# Patient Record
Sex: Female | Born: 1990 | Race: White | Hispanic: No | Marital: Single | Smoking: Former smoker
Health system: Southern US, Community
[De-identification: ages and names within clinical notes are randomized; demographics above are authoritative.]

## PROBLEM LIST (undated history)

## (undated) ENCOUNTER — Inpatient Hospital Stay (HOSPITAL_COMMUNITY): Payer: Self-pay

## (undated) DIAGNOSIS — Z87898 Personal history of other specified conditions: Secondary | ICD-10-CM

## (undated) DIAGNOSIS — G894 Chronic pain syndrome: Secondary | ICD-10-CM

## (undated) DIAGNOSIS — J45909 Unspecified asthma, uncomplicated: Secondary | ICD-10-CM

## (undated) DIAGNOSIS — F4310 Post-traumatic stress disorder, unspecified: Secondary | ICD-10-CM

## (undated) DIAGNOSIS — F1191 Opioid use, unspecified, in remission: Secondary | ICD-10-CM

## (undated) HISTORY — DX: Unspecified asthma, uncomplicated: J45.909

## (undated) HISTORY — PX: CARPAL TUNNEL RELEASE: SHX101

## (undated) HISTORY — DX: Personal history of other specified conditions: Z87.898

## (undated) HISTORY — DX: Opioid use, unspecified, in remission: F11.91

## (undated) HISTORY — PX: WISDOM TOOTH EXTRACTION: SHX21

---
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
## 2009-01-10 ENCOUNTER — Encounter: Payer: Self-pay | Admitting: Orthopedic Surgery

## 2009-01-25 ENCOUNTER — Encounter: Payer: Self-pay | Admitting: Orthopedic Surgery

## 2009-02-01 ENCOUNTER — Ambulatory Visit: Payer: Self-pay | Admitting: Orthopedic Surgery

## 2009-02-01 DIAGNOSIS — G5600 Carpal tunnel syndrome, unspecified upper limb: Secondary | ICD-10-CM | POA: Insufficient documentation

## 2009-03-30 ENCOUNTER — Ambulatory Visit (HOSPITAL_COMMUNITY): Admission: RE | Admit: 2009-03-30 | Discharge: 2009-03-30 | Payer: Self-pay | Admitting: Orthopaedic Surgery

## 2009-06-06 ENCOUNTER — Ambulatory Visit (HOSPITAL_COMMUNITY): Admission: RE | Admit: 2009-06-06 | Discharge: 2009-06-06 | Payer: Self-pay | Admitting: Orthopaedic Surgery

## 2009-11-27 ENCOUNTER — Emergency Department (HOSPITAL_COMMUNITY): Admission: EM | Admit: 2009-11-27 | Discharge: 2009-11-27 | Payer: Self-pay | Admitting: Emergency Medicine

## 2010-10-30 ENCOUNTER — Emergency Department (HOSPITAL_COMMUNITY)
Admission: EM | Admit: 2010-10-30 | Discharge: 2010-10-30 | Payer: Self-pay | Source: Home / Self Care | Admitting: Emergency Medicine

## 2011-02-20 ENCOUNTER — Emergency Department (HOSPITAL_COMMUNITY)
Admission: EM | Admit: 2011-02-20 | Discharge: 2011-02-20 | Disposition: A | Discharge status: Home / Self Care | Payer: Self-pay | Attending: Emergency Medicine | Admitting: Emergency Medicine

## 2011-02-20 ENCOUNTER — Emergency Department (HOSPITAL_COMMUNITY): Payer: Self-pay

## 2011-02-20 DIAGNOSIS — R071 Chest pain on breathing: Secondary | ICD-10-CM | POA: Insufficient documentation

## 2011-03-03 LAB — CBC
HCT: 39.2 % (ref 36.0–46.0)
MCV: 88 fL (ref 78.0–100.0)
RBC: 4.46 MIL/uL (ref 3.87–5.11)
WBC: 13 10*3/uL — ABNORMAL HIGH (ref 4.0–10.5)

## 2011-03-03 LAB — DIFFERENTIAL
Basophils Absolute: 0 10*3/uL (ref 0.0–0.1)
Eosinophils Relative: 1 % (ref 0–5)
Lymphocytes Relative: 28 % (ref 12–46)
Neutro Abs: 8.6 10*3/uL — ABNORMAL HIGH (ref 1.7–7.7)

## 2011-03-03 LAB — URINALYSIS, ROUTINE W REFLEX MICROSCOPIC
Bilirubin Urine: NEGATIVE
Nitrite: NEGATIVE
Specific Gravity, Urine: 1.01 (ref 1.005–1.030)
Urobilinogen, UA: 0.2 mg/dL (ref 0.0–1.0)

## 2011-03-03 LAB — COMPREHENSIVE METABOLIC PANEL
BUN: 6 mg/dL (ref 6–23)
CO2: 26 mEq/L (ref 19–32)
Chloride: 106 mEq/L (ref 96–112)
Creatinine, Ser: 0.58 mg/dL (ref 0.4–1.2)
GFR calc non Af Amer: >60 mL/min (ref >60)
Glucose, Bld: 99 mg/dL (ref 70–99)
Total Bilirubin: 0.2 mg/dL — ABNORMAL LOW (ref 0.3–1.2)

## 2011-03-03 LAB — HCG, QUANTITATIVE, PREGNANCY: hCG, Beta Chain, Quant, S: <2 m[IU]/mL (ref <5)

## 2011-03-05 LAB — COMPREHENSIVE METABOLIC PANEL
ALT: 13 U/L (ref 0–35)
AST: 16 U/L (ref 0–37)
Albumin: 3.7 g/dL (ref 3.5–5.2)
CO2: 23 mEq/L (ref 19–32)
Calcium: 9 mg/dL (ref 8.4–10.5)
GFR calc Af Amer: >60 mL/min (ref >60)
GFR calc non Af Amer: >60 mL/min (ref >60)
Sodium: 138 mEq/L (ref 135–145)
Total Protein: 6.4 g/dL (ref 6.0–8.3)

## 2011-03-05 LAB — DIFFERENTIAL
Eosinophils Absolute: 0.1 10*3/uL (ref 0.0–0.7)
Eosinophils Relative: 1 % (ref 0–5)
Lymphs Abs: 3 10*3/uL (ref 0.7–4.0)
Monocytes Absolute: 0.6 10*3/uL (ref 0.1–1.0)
Monocytes Relative: 6 % (ref 3–12)

## 2011-03-05 LAB — URINALYSIS, ROUTINE W REFLEX MICROSCOPIC
Bilirubin Urine: NEGATIVE
Glucose, UA: NEGATIVE mg/dL
Ketones, ur: NEGATIVE mg/dL
Specific Gravity, Urine: 1.02 (ref 1.005–1.030)
pH: 6.5 (ref 5.0–8.0)

## 2011-03-05 LAB — URINE MICROSCOPIC-ADD ON

## 2011-03-05 LAB — CBC
MCHC: 34.7 g/dL (ref 30.0–36.0)
Platelets: 195 10*3/uL (ref 150–400)
RBC: 4.48 MIL/uL (ref 3.87–5.11)

## 2011-04-09 NOTE — Op Note (Signed)
NAME:  Jade Taylor, CARRIER           ACCOUNT NO.:  [id number]   MEDICAL RECORD NO.:  [id number]          PATIENT TYPE:  AMB   LOCATION:  DAY                           FACILITY:  [facility]   PHYSICIAN:  J. Darreld Mclean, M.D. DATE OF BIRTH:  1991/10/01   DATE OF PROCEDURE:  DATE OF DISCHARGE:                               OPERATIVE REPORT   PREOPERATIVE DIAGNOSIS:  Carpal tunnel syndrome, right.   POSTOPERATIVE DIAGNOSIS:  Carpal tunnel syndrome, right.   PROCEDURES:  Open release of right carpal tunnel with saline neurolysis,  aponeurotomy of the right median nerve.   ANESTHESIA:  Bier block.   SURGEON:  J. Darreld Mclean, MD   TOURNIQUET TIME:  Please refer anesthesia record.   DRAINS:  None.   SPLINT:  None.   The patient is an 20 year old female with positive EMGs for carpal  tunnel syndrome, severe on the right moderate on the left.  She has not  improved the conservative treatment.  It has gotten progressively worse  where she is dropping things.  She has definite findings of decreased  sensation in the median nerve, has not improved.  Surgery is  recommended.  Otherwise, she is young, but she has not improved with any  several treatments to date and she is getting worse.   Risk and imponderables of the procedure were discussed preoperatively  and she understood, she asked appropriate questions.   DESCRIPTION OF PROCEDURE:  The patient was seen in the holding area.  The right wrist was identified as a correct surgical site.  She placed a  marker, I placed a marker.  She had a tattoo at the most proximal  portion where the wound would be and I explained that the incision would  go through the tattoo and that it could deform the tattoo and she  understood this.  The patient was brought to the operating room, placed  supine on the operating room table and Bier block anesthesia was  obtained.  She was prepped and draped in the usual manner.   Generalized time-out, operating  room team identified the patient as Ms.  [name] for doing the right wrist.  The team members knew each other.  All instrumentation was deemed to be properly positioned and ready.  The  time out was therefore completed.   Outline for incision was made with careful dissection and the median  nerve was identified proximally.  The vessel loop placed around the  nerve.  A groove director was then placed in the carpal tunnel space.  The volar carpal ligament was incised.  The nerve was obviously  compressed.  Saline neurolysis and aponeurotomy carried out.  Retinaculum cut proximally under the skin.  There was inspected no  apparent injury, the specimen of volar carpal ligament sent to  Pathology.  The wound was then reapproximated using 3-0 nylon  interrupted vertical mattress manner.  Sterile dressing was applied.  Bulky dressing applied.  Sheet cotton applied.  Sheet cotton cut  dorsally.  An Ace bandage then applied loosely.  The patient tolerated  the procedure well.  Prescription given Vicodin ES for pain.  I will see  in the office in approximately 10 days 2 weeks.  If she has any  difficulty and she contact me through the office hospital beeper system,  numbers have been provided.           ______________________________  Shela Commons. Darreld Mclean, M.D.     JWK/MEDQ  D:  03/30/2009  T:  03/30/2009  Job:  [id number]

## 2011-04-09 NOTE — Op Note (Signed)
NAME:  Jade Taylor, Jade Taylor           ACCOUNT NO.:  [id number]   MEDICAL RECORD NO.:  [id number]          PATIENT TYPE:  AMB   LOCATION:  DAY                           FACILITY:  [facility]   PHYSICIAN:  J. Darreld Mclean, M.D. DATE OF BIRTH:  November 04, 1991   DATE OF PROCEDURE:  06/06/2009  DATE OF DISCHARGE:  06/06/2009                               OPERATIVE REPORT   PREOPERATIVE DIAGNOSIS:  Carpal tunnel syndrome, left.   POSTOPERATIVE DIAGNOSIS:  Carpal tunnel syndrome, left.   PROCEDURE:  Release of volar carpal ligament, saline neurolysis,  aponeurotomy, left median nerve.   ANESTHESIA:  Bier block.  Please refer anesthesia record for tourniquet  time.   SURGEON:  J. Darreld Mclean, MD   DRAINS:  No drains.  No splint.   INDICATIONS:  The patient is an 20 year old with positive nerve  conduction velocity studies and EMG showing carpal tunnel syndrome  bilaterally.  She has undergone right release and may has done well now  desires to have it done on the left side.  She understands the risks and  imponderables of the procedure.   DESCRIPTION OF PROCEDURE:  The patient was seen in the holding area, the  left wrist identified as correct surgical site.  She placed a mark, I  placed a mark.  She was brought to the operating room.  She placed  supine, given Bier block anesthesia.   A generalized time-out identified, [name] is the patient, left  wrist as correct surgical site.  All instrumentation was deemed to be  properly working.  The operating room team knew each other.   She has previously been prepped and draped.  Incision was made and the  median nerve identified proximally.  Vessel loop placed around the  nerve.  A groove director was used and the volar carpal ligament was  identified and then incised.  Specimen was sent to pathology.  The nerve  was obviously compressed.  Saline neurolysis aponeurotomy carried out.  Retinaculum cut proximally.  Wounds reapproximated using  3-0 nylon  interrupted vertical mattress manner.  Sterile dressing applied.  Bulky  dressing applied and sheet cotton applied.  Sheet cotton  cut dorsally.  ACE bandage applied loosely.  The patient tolerated the  procedure well.  Prescription of Vicodin ES given for pain.  She was  seen in the office in 1 week.  If any difficulties contact me through  the office hospital beeper system.           ______________________________  Shela Commons. Darreld Mclean, M.D.     JWK/MEDQ  D:  06/06/2009  T:  06/07/2009  Job:  [id number]

## 2011-04-09 NOTE — H&P (Signed)
NAME:  Jade Taylor, Jade Taylor           ACCOUNT NO.:  [id number]   MEDICAL RECORD NO.:  [id number]          PATIENT TYPE:  AMB   LOCATION:  DAY                           FACILITY:  [facility]   PHYSICIAN:  J. Darreld Mclean, M.D. DATE OF BIRTH:  [birth year]   DATE OF ADMISSION:  DATE OF DISCHARGE:  LH                              HISTORY & PHYSICAL   CHIEF COMPLAINT:  Carpal tunnel syndrome.   The patient is an 20 year old female with pain and tenderness in both  hands.  She had carpal tunnel syndrome release done on the right on Mar 30, 2009.  She had positive EMGs.  She is not responding to other means  of therapy or treatment.  She has had wrist splinting.  She had an EMG  by Dr. Gerilyn Pilgrim, showing the carpal tunnel syndrome.  She has done well  with the surgery on the right wrist and now presents for carpal tunnel  release on the left wrist.  Risks and imponderables have been explained  to the patient preoperatively, she appears to understand the procedure.  She just as stated went through it on the right side and understands  what is to be expected.   Past history is negative.   She has been followed by Dr. Gerilyn Pilgrim for nerve problems.   She is allergic to PENICILLIN and FLAGYL.   She does not smoke.  She does not use alcoholic beverages.   She takes Vicodin for pain and ibuprofen 100 mg t.i.d.   Other than the carpal tunnel surgery earlier this year, she had wisdom  teeth removed in January of this year.   [facility] Nobie Putnam [facility] is her family doctor.  She lives in  [place], is unmarried.   PHYSICAL EXAMINATION:  GENERAL:  She is alert, cooperative, and  oriented.  VITAL SIGNS:  Within normal limits.  HEENT:  Negative.  NECK:  Supple.  LUNGS:  Clear to P and A.  HEART:  Regular without murmur heard.  ABDOMEN:  Soft and nontender without masses.  EXTREMITIES:  Right hand has well-healed scar over the volar side with  good return of sensation.  Left hand has a  positive Phalen, positive  Tinel with decreased sensation in the median nerve distribution.  CNS:  Intact.  SKIN:  Intact.   IMPRESSION:  Carpal tunnel syndrome on the left, status post release  carpal tunnel on the right.   Her labs are pending.  This is an outpatient procedure.                                            ______________________________  J. Darreld Mclean, M.D.     JWK/MEDQ  D:  06/05/2009  T:  06/06/2009  Job:  [id number]

## 2011-04-09 NOTE — H&P (Signed)
NAME:  Jade Taylor, Jade Taylor           ACCOUNT NO.:  [id number]   MEDICAL RECORD NO.:  [id number]          PATIENT TYPE:  AMB   LOCATION:  DAY                           FACILITY:  [facility]   PHYSICIAN:  J. Darreld Mclean, M.D. DATE OF BIRTH:  Mar 28, 1991   DATE OF ADMISSION:  DATE OF DISCHARGE:  LH                              HISTORY & PHYSICAL   CHIEF COMPLAINT:  Carpal tunnel syndrome.   The patient is an 20 year old female with pain and tenderness in both  hands.  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  Gerilyn Pilgrim on January 10, 2009.  She has not responded to rest, to  splinting or other conservative means of treatment.  She has also been  seen by Dr. Romeo Apple.  Because she has not gotten better and because  pain continues, I have recommended carpal tunnel release even at this  young age.   PAST HISTORY:  Is negative.  She has been followed by Dr. Gerilyn Pilgrim for  the nerve problem.  She allergic to PENICILLIN and FLAGYL.  She does not  smoke.  She does not use alcoholic beverages.  She is taking Vicodin for  pain and ibuprofen 800 mg t.i.d.   She had wisdom teeth removed in January of this year.   Dr. Nobie Putnam and the health department are her family doctors.  She  lives in [place] and is unmarried.  She is alert, cooperative,  oriented.   PHYSICAL EXAMINATION:  VITAL SIGNS:  BP 140/80, pulse 80, respirations  16, afebrile, 5 feet 3.1, 222 pounds.  HEENT:  Negative.  NECK:  Supple.  LUNGS:  Clear to P and A.  HEART:  Regular with no murmur heard.  ABDOMEN:  Soft, nontender without masses.  EXTREMITIES:  She has got bilateral Phalen's, bilateral Tinel's - both  wrists, right greater than left.  Decreased sensation of the median  nerve right greater than left.  CENTRAL NERVOUS SYSTEM:  Intact except as noted.  SKIN:  Intact.   IMPRESSION:  Bilateral carpal tunnel syndrome, right greater  than left,  as demonstrated by EMGs.   PLAN:  Carpal tunnel release by open means on the right wrist.  I have  discussed with the patient planned procedure, risks and imponderables,  and she appears to understand.  She understands this is an elective  procedure.  Labs are pending.                                            ______________________________  J. Darreld Mclean, M.D.     JWK/MEDQ  D:  03/29/2009  T:  03/29/2009  Job:  [id number]

## 2011-04-12 NOTE — Discharge Summary (Signed)
Jade Taylor, Jade Taylor           ACCOUNT NO.:  [id number]   MEDICAL RECORD NO.:  [id number]          PATIENT TYPE:  INP   LOCATION:  6125                         FACILITY:  [facility]   PHYSICIAN:  ABELLI, M.D.           DATE OF BIRTH:  Jan 31, 1991   DATE OF ADMISSION:  06/09/2006  DATE OF DISCHARGE:  06/11/2006                                 DISCHARGE SUMMARY   REASON FOR ADMISSION:  Anaphylaxis.   SIGNIFICANT FINDINGS:  This is a 20 year old, recently treated with Flagyl  for a skin abscess, presenting with urticaria x1 day and shortness of breath  which developed the day of presentation.  No identifiable causes of this  elicited for the allergic reaction, as the antibiotic was discontinued on  the day prior to presentation.  The rash, edema, and shortness of breath  improved with Solu-Medrol, Benadryl, Pepcid, and epinephrine given in the  ER.  Given the severity of the rash and the shortness of breath, she was  admitted and given scheduled Solu-Medrol q.6 h. for observation of delayed  response.  She did have a delayed response the next evening with an  increased worsening of her rash over her thighs and stomach.  This resolved  with Benadryl, Loratadine, and Ranitidine and the IV steroids were still on  board.  The patient was improved and free of rash and shortness of breath at  discharge.   TREATMENT:  1.  IV corticosteroids, antihistamines and fluids.  2.  Incision and drainage of a back cyst/abscess on 06/10/2006 with 3 inch      Iodoform packing placed.  Bactrim DS was also given for the abscess.   OPERATIONS AND PROCEDURES:  None.   FINAL DIAGNOSES:  1.  Anaphylaxis, cause not identified.  2.  Skin abscess, Staph aureus.   DISCHARGE MEDICATIONS AND INSTRUCTIONS:  1.  Bactrim DS 1 tab p.o. b.i.d. x9 days.  2.  Epi-Pen 0.3 mg IM p.r.n. for shortness of breath and hives, dispense #3.   PENDING RESULTS AND ISSUES TO BE FOLLOWED:  1.  Back wound needs to be repacked  tomorrow at [facility]      and the patient is to follow up with peds surgery if the abscess does      not clear.  2.  Wound culture and sensitivities are still pending.  3.  Follow up at [facility], phone number [phone], fax      number [phone], at 10:15 a.m., tomorrow morning, Friday 06/12/2006.   DISCHARGE WEIGHT:  90 k.   DISCHARGE CONDITION:  Improved, stable.     ______________________________  Pediatrics Resident    ______________________________  Teodoro Spray, M.D.    PR/MEDQ  D:  06/11/2006  T:  06/11/2006  Job:  [id number]   cc:   Health Department [facility]  Fax: [phone]

## 2011-08-13 ENCOUNTER — Encounter: Payer: Self-pay | Admitting: *Deleted

## 2011-08-13 ENCOUNTER — Emergency Department (HOSPITAL_COMMUNITY): Payer: Self-pay

## 2011-08-13 ENCOUNTER — Emergency Department (HOSPITAL_COMMUNITY)
Admission: EM | Admit: 2011-08-13 | Discharge: 2011-08-13 | Disposition: A | Discharge status: Home / Self Care | Payer: Self-pay | Attending: Emergency Medicine | Admitting: Emergency Medicine

## 2011-08-13 DIAGNOSIS — R10819 Abdominal tenderness, unspecified site: Secondary | ICD-10-CM | POA: Insufficient documentation

## 2011-08-13 DIAGNOSIS — N898 Other specified noninflammatory disorders of vagina: Secondary | ICD-10-CM | POA: Insufficient documentation

## 2011-08-13 DIAGNOSIS — R630 Anorexia: Secondary | ICD-10-CM | POA: Insufficient documentation

## 2011-08-13 DIAGNOSIS — R109 Unspecified abdominal pain: Secondary | ICD-10-CM

## 2011-08-13 DIAGNOSIS — D72829 Elevated white blood cell count, unspecified: Secondary | ICD-10-CM

## 2011-08-13 HISTORY — DX: Chronic pain syndrome: G89.4

## 2011-08-13 LAB — DIFFERENTIAL
Basophils Absolute: 0 10*3/uL (ref 0.0–0.1)
Lymphocytes Relative: 20 % (ref 12–46)
Neutro Abs: 10.7 10*3/uL — ABNORMAL HIGH (ref 1.7–7.7)
Neutrophils Relative %: 73 % (ref 43–77)

## 2011-08-13 LAB — COMPREHENSIVE METABOLIC PANEL
ALT: 10 U/L (ref 0–35)
AST: 12 U/L (ref 0–37)
Albumin: 4 g/dL (ref 3.5–5.2)
CO2: 27 mEq/L (ref 19–32)
Chloride: 100 mEq/L (ref 96–112)
GFR calc non Af Amer: >60 mL/min (ref >60)
Sodium: 136 mEq/L (ref 135–145)
Total Bilirubin: 0.3 mg/dL (ref 0.3–1.2)

## 2011-08-13 LAB — URINALYSIS, ROUTINE W REFLEX MICROSCOPIC
Ketones, ur: NEGATIVE mg/dL
Leukocytes, UA: NEGATIVE
Nitrite: NEGATIVE
Specific Gravity, Urine: <1.005 — ABNORMAL LOW (ref 1.005–1.030)
Urobilinogen, UA: 0.2 mg/dL (ref 0.0–1.0)
pH: 6.5 (ref 5.0–8.0)

## 2011-08-13 LAB — CBC
MCH: 30.8 pg (ref 26.0–34.0)
MCV: 88.9 fL (ref 78.0–100.0)
Platelets: 226 10*3/uL (ref 150–400)
RBC: 4.77 MIL/uL (ref 3.87–5.11)
RDW: 13 % (ref 11.5–15.5)
WBC: 14.7 10*3/uL — ABNORMAL HIGH (ref 4.0–10.5)

## 2011-08-13 LAB — URINE MICROSCOPIC-ADD ON

## 2011-08-13 MED ORDER — ONDANSETRON HCL 4 MG/2ML IJ SOLN
4.0000 mg | Freq: Once | INTRAMUSCULAR | Status: AC
  Administered 2011-08-13: 4 mg via INTRAVENOUS
  Filled 2011-08-13: qty 2

## 2011-08-13 MED ORDER — MORPHINE SULFATE 4 MG/ML IJ SOLN
4.0000 mg | Freq: Once | INTRAMUSCULAR | Status: AC
  Administered 2011-08-13: 4 mg via INTRAVENOUS
  Filled 2011-08-13: qty 1

## 2011-08-13 MED ORDER — OXYCODONE-ACETAMINOPHEN 5-325 MG PO TABS: 2.0000 | ORAL_TABLET | ORAL | Status: AC | PRN

## 2011-08-13 MED ORDER — IOHEXOL 300 MG/ML  SOLN
100.0000 mL | Freq: Once | INTRAMUSCULAR | Status: AC | PRN
  Administered 2011-08-13: 100 mL via INTRAVENOUS

## 2011-08-13 MED ORDER — SODIUM CHLORIDE 0.9 % IV SOLN
INTRAVENOUS | Status: DC
  Administered 2011-08-13: 21:00:00 via INTRAVENOUS

## 2011-08-13 NOTE — ED Provider Notes (Addendum)
History     CSN: [id number] Arrival date & time: 08/13/2011  7:58 PM   Chief Complaint  Patient presents with  . Abdominal Pain     (Include location/radiation/quality/duration/timing/severity/associated sxs/prior treatment) Patient is a 20 y.o. female presenting with abdominal pain. The history is provided by the patient.  Abdominal Pain The primary symptoms of the illness include abdominal pain. The primary symptoms of the illness do not include fever, shortness of breath, nausea, vomiting, diarrhea, dysuria, vaginal discharge or vaginal bleeding.  Symptoms associated with the illness do not include chills, diaphoresis, hematuria, frequency or back pain.   she is a 20 year old female.  She presents to the emergency department with right lower abdominal pain for 2 days.  She denies nausea, vomiting, diarrhea, urinary tract symptoms.  She denies vaginal bleeding or discharge.  She has not eaten since this morning and she has no appetite now.  She says her last menstrual period was about a week ago and it was normal.  She denies a prior history of abdominal surgery.  She drinks alcohol only occasionally.   Past Medical History  Diagnosis Date  . Chronic pain syndrome      Past Surgical History  Procedure Date  . Carpal tunnel release   . Wisdom tooth extraction     History reviewed. No pertinent family history.  History  Substance Use Topics  . Smoking status: Current Some Day Smoker -- 0.5 packs/day for .5 years    Types: Cigarettes  . Smokeless tobacco: Not on file  . Alcohol Use: Yes     ocassilly    OB History    Grav Para Term Preterm Abortions TAB SAB Ect Mult Living                  Review of Systems  Constitutional: Negative for fever, chills and diaphoresis.  HENT: Negative for congestion and neck pain.   Eyes: Negative for redness.  Respiratory: Negative for cough, chest tightness and shortness of breath.   Gastrointestinal: Positive for abdominal pain.  Negative for nausea, vomiting and diarrhea.  Genitourinary: Negative for dysuria, frequency, hematuria, vaginal bleeding, vaginal discharge and pelvic pain.  Musculoskeletal: Negative for back pain.  Skin: Negative for rash.  Neurological: Negative for light-headedness, numbness and headaches.  Psychiatric/Behavioral: Negative for confusion.    Allergies  Penicillins and Metronidazole  Home Medications  No current outpatient prescriptions on file.  Physical Exam    BP 138/91  Pulse 110  Temp(Src) 98.3 F (36.8 C) (Oral)  Resp 20  Ht 5\' 2"  (1.575 m)  Wt 187 lb (84.823 kg)  BMI 34.20 kg/m2  SpO2 99%  LMP 08/03/2011  Physical Exam  Constitutional: She is oriented to person, place, and time. She appears well-developed and well-nourished.       She appears to be uncomfortable  HENT:  Head: Normocephalic and atraumatic.  Eyes: Pupils are equal, round, and reactive to light.  Neck: Normal range of motion.  Cardiovascular: Normal rate, regular rhythm and normal heart sounds.   No murmur heard. Pulmonary/Chest: Effort normal and breath sounds normal. No respiratory distress. She has no wheezes. She has no rales.  Abdominal: Soft. She exhibits no distension and no mass. There is tenderness. There is rebound. There is no guarding.       Positive Rovsing sign Positive psoas Negative obturator sign  Genitourinary: Vaginal discharge found.       She has a small amount of thin, white vaginal discharge.  She states she  had intercourse today.  She did not have cervical motion tenderness.  There are no lesions.  No adnexal masses  The pelvic examination was performed with the female chaperone  Musculoskeletal: Normal range of motion. She exhibits no edema and no tenderness.  Neurological: She is alert and oriented to person, place, and time. No cranial nerve deficit.  Skin: Skin is warm and dry. No rash noted. No erythema.  Psychiatric: She has a normal mood and affect. Her behavior is  normal.    ED Course  Procedures  20 year old female with right lower bowel pain for 2 days, along with anorexia.  No urinary tract symptoms, or gynecological symptoms.  She has abdominal wall tenderness, rebound, Rovsing's and psoas signs.  Therefore, I suspect that she is probably not an appendicitis rather than a gynecological source of her symptoms.  We will perform laboratory testing and a CAT scan of her abdomen.  I will provide her with IV analgesics while we are awaiting for the test results.  9:57 PM Pain persists, so I will give her more morphine IV.  I explained to her the results of her tests including a negative CT, so we will perform  pelvic examination.  10:10 PM Pain control No results found.   No diagnosis found.   MDM Abdominal pain  for 2 days, with no nausea, vomiting, diarrhea or urinary tract symptoms.  She denies vaginal discharge, and she has not had a fever, or leukocytosis.  May be due  To demarginization.  She does not have an appendicitis.   She does not have cervical motion tenderness or adnexal masses.  There is no acute abdomen.  CT also states says that the uterus and adnexa are within normal limits.        Nicholes Stairs, MD 08/13/11 2210  Nicholes Stairs, MD 08/13/11 0454  Nicholes Stairs, MD 08/13/11 2212

## 2011-08-13 NOTE — ED Notes (Signed)
Pt states pain started Sunday but improved became worse today. Denies any vomiting or diarrhea. Last bowel movement this morning

## 2011-09-05 LAB — CBC
HCT: 40.6
Hemoglobin: 13.7
MCHC: 33.7
MCV: 85.6
Platelets: 217
RDW: 13.5

## 2011-09-05 LAB — DIFFERENTIAL
Basophils Absolute: 0.1
Basophils Relative: 1
Eosinophils Absolute: 0.1
Eosinophils Relative: 0
Lymphocytes Relative: 26
Monocytes Absolute: 0.9

## 2011-09-05 LAB — URINALYSIS, ROUTINE W REFLEX MICROSCOPIC
Glucose, UA: NEGATIVE
Leukocytes, UA: NEGATIVE
Nitrite: NEGATIVE
Protein, ur: NEGATIVE

## 2011-09-05 LAB — WET PREP, GENITAL: Yeast Wet Prep HPF POC: NONE SEEN

## 2011-09-05 LAB — URINE MICROSCOPIC-ADD ON

## 2011-09-05 LAB — PREGNANCY, URINE: Preg Test, Ur: NEGATIVE

## 2011-09-06 LAB — BASIC METABOLIC PANEL
CO2: 23
Chloride: 107
Glucose, Bld: 113 — ABNORMAL HIGH
Potassium: 3.5
Sodium: 137

## 2011-09-06 LAB — DIFFERENTIAL
Basophils Absolute: 0
Basophils Relative: 0
Eosinophils Relative: 2
Monocytes Absolute: 0.9
Monocytes Relative: 7
Neutro Abs: 9.9 — ABNORMAL HIGH

## 2011-09-06 LAB — CBC
HCT: 39.7
Hemoglobin: 13.7
MCHC: 34.4
RBC: 4.7
RDW: 13.4

## 2012-09-21 ENCOUNTER — Encounter (HOSPITAL_COMMUNITY): Payer: Self-pay | Admitting: *Deleted

## 2012-09-21 ENCOUNTER — Emergency Department (HOSPITAL_COMMUNITY): Payer: Self-pay

## 2012-09-21 ENCOUNTER — Emergency Department (HOSPITAL_COMMUNITY)
Admission: EM | Admit: 2012-09-21 | Discharge: 2012-09-21 | Disposition: A | Discharge status: Home / Self Care | Payer: Self-pay | Attending: Emergency Medicine | Admitting: Emergency Medicine

## 2012-09-21 DIAGNOSIS — S93409A Sprain of unspecified ligament of unspecified ankle, initial encounter: Secondary | ICD-10-CM | POA: Insufficient documentation

## 2012-09-21 DIAGNOSIS — Z8669 Personal history of other diseases of the nervous system and sense organs: Secondary | ICD-10-CM | POA: Insufficient documentation

## 2012-09-21 DIAGNOSIS — Y92009 Unspecified place in unspecified non-institutional (private) residence as the place of occurrence of the external cause: Secondary | ICD-10-CM | POA: Insufficient documentation

## 2012-09-21 DIAGNOSIS — W19XXXA Unspecified fall, initial encounter: Secondary | ICD-10-CM | POA: Insufficient documentation

## 2012-09-21 DIAGNOSIS — F17200 Nicotine dependence, unspecified, uncomplicated: Secondary | ICD-10-CM | POA: Insufficient documentation

## 2012-09-21 DIAGNOSIS — Z8659 Personal history of other mental and behavioral disorders: Secondary | ICD-10-CM | POA: Insufficient documentation

## 2012-09-21 DIAGNOSIS — Y939 Activity, unspecified: Secondary | ICD-10-CM | POA: Insufficient documentation

## 2012-09-21 DIAGNOSIS — S93402A Sprain of unspecified ligament of left ankle, initial encounter: Secondary | ICD-10-CM

## 2012-09-21 HISTORY — DX: Post-traumatic stress disorder, unspecified: F43.10

## 2012-09-21 MED ORDER — HYDROCODONE-ACETAMINOPHEN 5-325 MG PO TABS
2.0000 | ORAL_TABLET | Freq: Once | ORAL | Status: AC
  Administered 2012-09-21: 2 via ORAL
  Filled 2012-09-21: qty 2

## 2012-09-21 MED ORDER — HYDROCODONE-ACETAMINOPHEN 7.5-325 MG PO TABS: 1.0000 | ORAL_TABLET | ORAL | Status: DC | PRN

## 2012-09-21 MED ORDER — DICLOFENAC SODIUM 75 MG PO TBEC: 75.0000 mg | DELAYED_RELEASE_TABLET | Freq: Two times a day (BID) | ORAL | Status: AC

## 2012-09-21 MED ORDER — KETOROLAC TROMETHAMINE 10 MG PO TABS
10.0000 mg | ORAL_TABLET | Freq: Once | ORAL | Status: AC
Start: 2012-09-21 — End: 2012-09-21
  Administered 2012-09-21: 10 mg via ORAL
  Filled 2012-09-21: qty 1

## 2012-09-21 MED ORDER — ONDANSETRON HCL 4 MG PO TABS
4.0000 mg | ORAL_TABLET | Freq: Once | ORAL | Status: AC
  Administered 2012-09-21: 4 mg via ORAL
  Filled 2012-09-21: qty 1

## 2012-09-21 NOTE — ED Notes (Signed)
Jade Taylor today and injured lt ankle.

## 2012-09-21 NOTE — ED Provider Notes (Signed)
History     CSN: [id number]  Arrival date & time 09/21/12  1815   None     Chief Complaint  Patient presents with  . Ankle Pain    (Consider location/radiation/quality/duration/timing/severity/associated sxs/prior treatment) Patient is a 21 y.o. female presenting with ankle pain. The history is provided by the patient.  Ankle Pain  The incident occurred 3 to 5 hours ago. The incident occurred at home. The injury mechanism was a fall. The pain is present in the left ankle. The quality of the pain is described as throbbing. The pain is severe. The pain has been constant since onset. Associated symptoms include inability to bear weight. Pertinent negatives include no loss of sensation. She reports no foreign bodies present. The symptoms are aggravated by activity and palpation. She has tried nothing for the symptoms.    Past Medical History  Diagnosis Date  . Chronic pain syndrome   . PTSD (post-traumatic stress disorder)     Past Surgical History  Procedure Date  . Carpal tunnel release   . Wisdom tooth extraction     History reviewed. No pertinent family history.  History  Substance Use Topics  . Smoking status: Current Some Day Smoker -- 0.5 packs/day for .5 years    Types: Cigarettes  . Smokeless tobacco: Not on file  . Alcohol Use: Yes     ocassilly    OB History    Grav Para Term Preterm Abortions TAB SAB Ect Mult Living                  Review of Systems  Constitutional: Negative for activity change.       All ROS Neg except as noted in HPI  HENT: Negative for nosebleeds and neck pain.   Eyes: Negative for photophobia and discharge.  Respiratory: Negative for cough, shortness of breath and wheezing.   Cardiovascular: Negative for chest pain and palpitations.  Gastrointestinal: Negative for abdominal pain and blood in stool.  Genitourinary: Negative for dysuria, frequency and hematuria.  Musculoskeletal: Positive for arthralgias. Negative for back pain.    Skin: Negative.   Neurological: Negative for dizziness, seizures and speech difficulty.  Psychiatric/Behavioral: Negative for hallucinations and confusion.    Allergies  Metronidazole; Penicillins; and Amoxicillin  Home Medications  No current outpatient prescriptions on file.  BP 116/67  Pulse 109  Temp 98.4 F (36.9 C) (Oral)  Resp 20  Ht 5\' 2"  (1.575 m)  Wt 198 lb 3 oz (89.897 kg)  BMI 36.25 kg/m2  SpO2 100%  LMP 09/21/2012  Physical Exam  Nursing note and vitals reviewed. Constitutional: She is oriented to person, place, and time. She appears well-developed and well-nourished.  Non-toxic appearance.  HENT:  Head: Normocephalic.  Right Ear: Tympanic membrane and external ear normal.  Left Ear: Tympanic membrane and external ear normal.  Eyes: EOM and lids are normal. Pupils are equal, round, and reactive to light.  Neck: Normal range of motion. Neck supple. Carotid bruit is not present.  Cardiovascular: Normal rate, regular rhythm, normal heart sounds, intact distal pulses and normal pulses.   Pulmonary/Chest: Breath sounds normal. No respiratory distress.  Abdominal: Soft. Bowel sounds are normal. There is no tenderness. There is no guarding.  Musculoskeletal: Normal range of motion.       There is full range of motion of the left hip and knee. There is no deformity of the left tibia or fibula area. There is lateral malleolus pain to palpation or manipulation. There is mild-to-moderate  swelling in this area. The dorsalis pedis and posterior tibial pulses are symmetrical. The capillary refill is less than 3 seconds, and the sensory is intact.  Lymphadenopathy:       Head (right side): No submandibular adenopathy present.       Head (left side): No submandibular adenopathy present.    She has no cervical adenopathy.  Neurological: She is alert and oriented to person, place, and time. She has normal strength. No cranial nerve deficit or sensory deficit.  Skin: Skin is warm  and dry.  Psychiatric: She has a normal mood and affect. Her speech is normal.    ED Course  Procedures (including critical care time)  Labs Reviewed - No data to display Dg Ankle Complete Left  09/21/2012  *RADIOLOGY REPORT*  Clinical Data: Fall.  Ankle pain  LEFT ANKLE COMPLETE - 3+ VIEW  Comparison: None.  Findings: Negative for fracture.  Normal alignment is normal joint space.  Negative for joint effusion.  IMPRESSION: Negative for fracture.   Original Report Authenticated By: Camelia Phenes, M.D.      No diagnosis found.    MDM  I have reviewed nursing notes, vital signs, and all appropriate lab and imaging results for this patient. X-ray of the left ankle is negative for fracture or dislocation. Patient states she is having severe pain of the lateral ankle area. There is mild swelling present. The patient will be treated with an ankle stirrup splint, crutches, ice pack, and prescription for Norco #20, and diclofenac. Patient has seen Dr. Hilda Lias in the past because of a fracture of the left ankle and we'll see him for followup evaluation.       Kathie Dike, [place] 09/21/12 2113

## 2012-09-24 NOTE — ED Provider Notes (Signed)
Medical screening examination/treatment/procedure(s) were performed by non-physician practitioner and as supervising physician I was immediately available for consultation/collaboration.   Laray Anger, DO 09/24/12 1237

## 2014-01-26 DIAGNOSIS — IMO0002 Reserved for concepts with insufficient information to code with codable children: Secondary | ICD-10-CM

## 2014-07-10 ENCOUNTER — Encounter (HOSPITAL_COMMUNITY): Payer: Self-pay | Admitting: Emergency Medicine

## 2014-07-10 ENCOUNTER — Emergency Department (HOSPITAL_COMMUNITY): Payer: Medicaid Other

## 2014-07-10 ENCOUNTER — Emergency Department (HOSPITAL_COMMUNITY)
Admission: EM | Admit: 2014-07-10 | Discharge: 2014-07-10 | Disposition: A | Discharge status: Home / Self Care | Payer: Medicaid Other | Attending: Emergency Medicine | Admitting: Emergency Medicine

## 2014-07-10 DIAGNOSIS — F17200 Nicotine dependence, unspecified, uncomplicated: Secondary | ICD-10-CM | POA: Insufficient documentation

## 2014-07-10 DIAGNOSIS — R079 Chest pain, unspecified: Secondary | ICD-10-CM | POA: Diagnosis present

## 2014-07-10 DIAGNOSIS — Z791 Long term (current) use of non-steroidal anti-inflammatories (NSAID): Secondary | ICD-10-CM | POA: Diagnosis not present

## 2014-07-10 DIAGNOSIS — Z8659 Personal history of other mental and behavioral disorders: Secondary | ICD-10-CM | POA: Insufficient documentation

## 2014-07-10 DIAGNOSIS — Z880 Allergy status to penicillin: Secondary | ICD-10-CM | POA: Insufficient documentation

## 2014-07-10 DIAGNOSIS — G894 Chronic pain syndrome: Secondary | ICD-10-CM | POA: Diagnosis not present

## 2014-07-10 DIAGNOSIS — I319 Disease of pericardium, unspecified: Secondary | ICD-10-CM | POA: Diagnosis not present

## 2014-07-10 LAB — BASIC METABOLIC PANEL
Anion gap: 11 (ref 5–15)
BUN: 5 mg/dL — ABNORMAL LOW (ref 6–23)
CO2: 26 mEq/L (ref 19–32)
Calcium: 9.4 mg/dL (ref 8.4–10.5)
Chloride: 102 mEq/L (ref 96–112)
Creatinine, Ser: 0.7 mg/dL (ref 0.50–1.10)
GFR calc Af Amer: >90 mL/min (ref >90)
GFR calc non Af Amer: >90 mL/min (ref >90)
Glucose, Bld: 100 mg/dL — ABNORMAL HIGH (ref 70–99)
Potassium: 3.4 mEq/L — ABNORMAL LOW (ref 3.7–5.3)
Sodium: 139 mEq/L (ref 137–147)

## 2014-07-10 LAB — CBC WITH DIFFERENTIAL/PLATELET
Basophils Absolute: 0 10*3/uL (ref 0.0–0.1)
Basophils Relative: 0 % (ref 0–1)
Eosinophils Absolute: 0.1 10*3/uL (ref 0.0–0.7)
Eosinophils Relative: 0 % (ref 0–5)
HCT: 42.1 % (ref 36.0–46.0)
Hemoglobin: 14.7 g/dL (ref 12.0–15.0)
Lymphocytes Relative: 20 % (ref 12–46)
Lymphs Abs: 3.1 10*3/uL (ref 0.7–4.0)
MCH: 30.7 pg (ref 26.0–34.0)
MCHC: 34.9 g/dL (ref 30.0–36.0)
MCV: 87.9 fL (ref 78.0–100.0)
Monocytes Absolute: 0.6 10*3/uL (ref 0.1–1.0)
Monocytes Relative: 4 % (ref 3–12)
Neutro Abs: 12 10*3/uL — ABNORMAL HIGH (ref 1.7–7.7)
Neutrophils Relative %: 76 % (ref 43–77)
Platelets: 199 10*3/uL (ref 150–400)
RBC: 4.79 MIL/uL (ref 3.87–5.11)
RDW: 14.3 % (ref 11.5–15.5)
WBC: 15.7 10*3/uL — ABNORMAL HIGH (ref 4.0–10.5)

## 2014-07-10 LAB — D-DIMER, QUANTITATIVE: D-Dimer, Quant: 0.48 ug/mL-FEU (ref 0.00–0.48)

## 2014-07-10 LAB — TROPONIN I: Troponin I: <0.3 ng/mL (ref <0.30)

## 2014-07-10 LAB — PREGNANCY, URINE: Preg Test, Ur: NEGATIVE

## 2014-07-10 MED ORDER — ONDANSETRON HCL 4 MG/2ML IJ SOLN
4.0000 mg | Freq: Once | INTRAMUSCULAR | Status: AC
  Administered 2014-07-10: 4 mg via INTRAMUSCULAR
  Filled 2014-07-10: qty 2

## 2014-07-10 MED ORDER — KETOROLAC TROMETHAMINE 30 MG/ML IJ SOLN
15.0000 mg | Freq: Once | INTRAMUSCULAR | Status: AC
  Administered 2014-07-10: 15 mg via INTRAVENOUS
  Filled 2014-07-10: qty 1

## 2014-07-10 MED ORDER — LORAZEPAM 2 MG/ML IJ SOLN
0.5000 mg | Freq: Once | INTRAMUSCULAR | Status: AC
  Administered 2014-07-10: 0.5 mg via INTRAVENOUS
  Filled 2014-07-10: qty 1

## 2014-07-10 MED ORDER — SODIUM CHLORIDE 0.9 % IV BOLUS (SEPSIS)
1000.0000 mL | Freq: Once | INTRAVENOUS | Status: AC
  Administered 2014-07-10: 1000 mL via INTRAVENOUS

## 2014-07-10 MED ORDER — MORPHINE SULFATE 4 MG/ML IJ SOLN
4.0000 mg | Freq: Once | INTRAMUSCULAR | Status: AC
  Administered 2014-07-10: 4 mg via INTRAVENOUS
  Filled 2014-07-10: qty 1

## 2014-07-10 MED ORDER — IBUPROFEN 400 MG PO TABS
400.0000 mg | ORAL_TABLET | Freq: Once | ORAL | Status: AC
  Administered 2014-07-10: 400 mg via ORAL
  Filled 2014-07-10: qty 1

## 2014-07-10 NOTE — ED Notes (Signed)
Pt presents to ED c/o chest pain x 3 days. Started intermittently but became constant yesterday. States that pain is exacerbated by exertion but that it also hurts when she is lying down. Pain is less severe when she is in sitting position. States she has taken acetaminophen for the pain. Pt states she does smoke marijuana sometimes but denies amphetamine use. Pt rates pain 7/10 but states that sometimes it is worse. Denies nausea/vomiting; has had dizziness/lightheaded episodes as well as "hot flashes." NAD.

## 2014-07-10 NOTE — ED Notes (Signed)
C/o anxiety, tearful in triage room

## 2014-07-10 NOTE — ED Notes (Signed)
Pt with mid CP since Thursday that radiates up to closer to throat

## 2014-07-10 NOTE — Discharge Instructions (Signed)
Pericarditis °Pericarditis is swelling (inflammation) of the pericardium. The pericardium is a thin, double-layered, fluid-filled tissue sac that surrounds the heart. The purpose of the pericardium is to contain the heart in the chest cavity and keep the heart from overexpanding. Different types of pericarditis can occur, such as: °· Acute pericarditis. Inflammation can develop suddenly in acute pericarditis. °· Chronic pericarditis. Inflammation develops gradually and is long-lasting in chronic pericarditis. °· Constrictive pericarditis. In this type of pericarditis, the layers of the pericardium stiffen and develop scar tissue. The scar tissue thickens and sticks together. This makes it difficult for the heart to pump and work as it normally does. °CAUSES  °Pericarditis can be caused from different conditions, such as: °· A bacterial, fungal or viral infection. °· After a heart attack (myocardial infarction). °· After open-heart surgery (coronary bypass graft surgery). °· Auto-immune conditions such as lupus, rheumatoid arthritis or scleroderma. °· Kidney failure. °· Low thyroid condition (hypothyroidism). °· Cancer from another part of the body that has spread (metastasized) to the pericardium. °· Chest injury or trauma. °· After radiation treatment. °· Certain medicines. °SYMPTOMS  °Symptoms of pericarditis can include: °· Chest pain. Chest pain symptoms may increase when laying down and may be relieved when sitting up and leaning forward. °· A chronic, dry cough. °· Heart palpitations. These may feel like rapid, fluttering or pounding heart beats. °· Chest pain may be worse when swallowing. °· Dizziness or fainting. °· Tiredness, fatigue or lethargy. °· Fever. °DIAGNOSIS  °Pericarditis is diagnosed by the following: °· A physical exam. A heart sound called a pericardial friction rub may be heard when your caregiver listens to your heart. °· Blood work. Blood may be drawn to check for an infection and to look at  your blood chemistry. °· Electrocardiography. During electrocardiography your heart's electrical activity is monitored and recorded with a tracing on paper (electrocardiogram [ECG]). °· Echocardiography. °· Computed tomography (CT). °· Magnetic resonance image (MRI). °TREATMENT  °To treat pericarditis, it is important to know the cause of it. The cause of pericarditis determines the treatment.  °· If the cause of pericarditis is due to an infection, treatment is based on the type of infection. If an infection is suspected in the pericardial fluid, a procedure called a pericardial fluid culture and biopsy may be done. This takes a sample of the pericardial fluid. The sample is sent to a lab which runs tests on the pericardial fluid to check for an infection. °· If the autoimmune disease is the cause, treatment of the autoimmune condition will help improve the pericarditis. °· If the cause of pericarditis is not known, anti-inflammatory medicines may be used to help decrease the inflammation. °· Surgery may be needed. The following are types of surgeries or procedures that may be done to treat pericarditis: °¨ Pericardial window. A pericardial window makes a cut (incision) into the pericardial sac. This allows excess fluid in the pericardium to drain. °¨ Pericardiocentesis. A pericardiocentesis is also known as a pericardial tap. This procedure uses a needle that is guided by X-ray to drain (aspirate) excess fluid from the pericardium. °¨ Pericardiectomy. A pericardiectomy removes part or all of the pericardium. °HOME CARE INSTRUCTIONS  °· Do not smoke. If you smoke, quit. Your caregiver can help you quit smoking. °· Maintain a healthy weight. °· Follow an exercise program as told by your caregiver. °· If you drink alcohol, do so in moderation. °· Eat a heart healthy diet. A registered dietician can help you learn about   healthy food choices. °· Keep a list of all your medicines with you at all times. Include the name,  dose, how often it is taken and how it is taken. °SEEK IMMEDIATE MEDICAL CARE IF:  °· You have chest pain or feelings of chest pressure. °· You have sweating (diaphoresis) when at rest. °· You have irregular heartbeats (palpitations). °· You have rapid, racing heart beats. °· You have unexplained fainting episodes. °· You feel sick to your stomach (nausea) or vomiting without cause. °· You have unexplained weakness. °If you develop any of the symptoms which originally made you seek care, call for local emergency medical help. Do not drive yourself to the hospital. °Document Released: 05/07/2001 Document Revised: 02/03/2012 Document Reviewed: 11/13/2011 °ExitCare® Patient Information ©2015 [employer]. This information is not intended to replace advice given to you by your health care provider. Make sure you discuss any questions you have with your health care provider. ° °

## 2014-07-10 NOTE — ED Provider Notes (Signed)
CSN: [id number]     Arrival date & time 07/10/14  1621 History   First MD Initiated Contact with Patient 07/10/14 1658     Chief Complaint  Patient presents with  . Chest Pain     (Consider location/radiation/quality/duration/timing/severity/associated sxs/prior Treatment) HPI  23 year old female with chest pain. Gradual onset about 3 days ago. Pain as sharp. Worsen with deep inspiration and when laying supine. No fevers or chills. Occasional nonproductive cough. No shortness of breath. Some mild lightheadedness. No syncope. Occasionally smokes marijuana. Denies any other drug use. No unusual leg pain or swelling. No history similar symptoms. She states that she does feel somewhat anxious.  Past Medical History  Diagnosis Date  . Chronic pain syndrome   . PTSD (post-traumatic stress disorder)    Past Surgical History  Procedure Laterality Date  . Carpal tunnel release    . Wisdom tooth extraction     History reviewed. No pertinent family history. History  Substance Use Topics  . Smoking status: Current Some Day Smoker -- 0.50 packs/day for .5 years    Types: Cigarettes  . Smokeless tobacco: Not on file  . Alcohol Use: Yes     Comment: ocassilly   OB History   Grav Para Term Preterm Abortions TAB SAB Ect Mult Living                 Review of Systems  All systems reviewed and negative, other than as noted in HPI.   Allergies  Metronidazole; Penicillins; and Amoxicillin  Home Medications   Prior to Admission medications   Medication Sig Start Date End Date Taking? Authorizing Provider  diclofenac (VOLTAREN) 75 MG EC tablet Take 75 mg by mouth 2 (two) times daily.   Yes Historical Provider, MD  HYDROcodone-acetaminophen (NORCO) 7.5-325 MG per tablet Take 1 tablet by mouth every 4 (four) hours as needed. pain   Yes Historical Provider, MD   BP 128/103  Pulse 128  Temp(Src) 98.3 F (36.8 C) (Oral)  Resp 22  Ht 5\' 2"  (1.575 m)  Wt 190 lb (86.183 kg)  BMI 34.74  kg/m2  SpO2 99%  LMP 07/08/2014 Physical Exam  Nursing note and vitals reviewed. Constitutional: She appears well-developed and well-nourished. No distress.  HENT:  Head: Normocephalic and atraumatic.  Eyes: Conjunctivae are normal. Right eye exhibits no discharge. Left eye exhibits no discharge.  Neck: Neck supple.  Cardiovascular: Normal rate, regular rhythm and normal heart sounds.  Exam reveals no gallop and no friction rub.   No murmur heard. Pulmonary/Chest: Effort normal and breath sounds normal. No respiratory distress.  Abdominal: Soft. She exhibits no distension. There is no tenderness.  Musculoskeletal: She exhibits no edema and no tenderness.  Lower extremities symmetric as compared to each other. No calf tenderness. Negative Homan's. No palpable cords.   Neurological: She is alert.  Skin: Skin is warm and dry.  Psychiatric: She has a normal mood and affect. Her behavior is normal. Thought content normal.    ED Course  Procedures (including critical care time) Labs Review Labs Reviewed  BASIC METABOLIC PANEL - Abnormal; Notable for the following:    Potassium 3.4 (*)    Glucose, Bld 100 (*)    BUN 5 (*)    All other components within normal limits  CBC WITH DIFFERENTIAL - Abnormal; Notable for the following:    WBC 15.7 (*)    Neutro Abs 12.0 (*)    All other components within normal limits  TROPONIN I  D-DIMER, QUANTITATIVE  PREGNANCY, URINE    Imaging Review No results found.  Dg Chest 2 View  07/10/2014   CLINICAL DATA:  Chest pain and shortness of Breath.  EXAM: CHEST  2 VIEW  COMPARISON:  02/20/2011.  FINDINGS: The heart size and mediastinal contours are within normal limits. Both lungs are clear. The visualized skeletal structures are unremarkable.  IMPRESSION: Normal chest x-ray.   Electronically Signed   By: Loralie Champagne M.D.   On: 07/10/2014 18:19    EKG Interpretation   Date/Time:  Sunday July 10 2014 16:57:30 EDT Ventricular Rate:  103 PR  Interval:    QRS Duration: 81 QT Interval:  333 QTC Calculation: 436 R Axis:   79 Text Interpretation:  Sinus tachycardia Non-specific ST-t changes  Confirmed by Juleen China  MD, Lively Haberman ([id number]) on 07/10/2014 5:14:46 PM      MDM   Final diagnoses:  Pericarditis    23yF with CP. Possible pericarditis. With positional changes. EKG with no specific changes. Labs including ddimer fine. Leukocytosis, but nonspecific. Afebrile. Appears well. Normal CXR. HD stable. Plan course of NSAIDs. If indeed pericarditis, no significantly concerning features noted. I feel appropriate for outpt tx. Return precautions discussed.    Raeford Razor, MD 07/19/14 [phone]

## 2015-05-11 ENCOUNTER — Inpatient Hospital Stay (HOSPITAL_COMMUNITY): Payer: Medicaid Other

## 2015-05-11 ENCOUNTER — Encounter (HOSPITAL_COMMUNITY): Payer: Self-pay

## 2015-05-11 ENCOUNTER — Inpatient Hospital Stay (HOSPITAL_COMMUNITY)
Admission: AD | Admit: 2015-05-11 | Discharge: 2015-05-12 | Disposition: A | Discharge status: Home / Self Care | Payer: Medicaid Other | Source: Ambulatory Visit | Attending: Obstetrics & Gynecology | Admitting: Obstetrics & Gynecology

## 2015-05-11 DIAGNOSIS — Z3A11 11 weeks gestation of pregnancy: Secondary | ICD-10-CM | POA: Insufficient documentation

## 2015-05-11 DIAGNOSIS — R102 Pelvic and perineal pain: Secondary | ICD-10-CM | POA: Insufficient documentation

## 2015-05-11 DIAGNOSIS — O26891 Other specified pregnancy related conditions, first trimester: Secondary | ICD-10-CM

## 2015-05-11 DIAGNOSIS — O219 Vomiting of pregnancy, unspecified: Secondary | ICD-10-CM | POA: Diagnosis not present

## 2015-05-11 DIAGNOSIS — O210 Mild hyperemesis gravidarum: Secondary | ICD-10-CM | POA: Insufficient documentation

## 2015-05-11 DIAGNOSIS — R109 Unspecified abdominal pain: Secondary | ICD-10-CM | POA: Diagnosis present

## 2015-05-11 LAB — POCT PREGNANCY, URINE: PREG TEST UR: POSITIVE — AB

## 2015-05-11 NOTE — MAU Note (Signed)
LMP beginning of April, positive pregnancy test at home.  Lower and mid abdominal pain. Vomiting today. Denies vaginal bleeding. Vaginal discharge; states looks normal to her.

## 2015-05-12 DIAGNOSIS — O219 Vomiting of pregnancy, unspecified: Secondary | ICD-10-CM

## 2015-05-12 LAB — URINALYSIS, ROUTINE W REFLEX MICROSCOPIC
BILIRUBIN URINE: NEGATIVE
GLUCOSE, UA: NEGATIVE mg/dL
HGB URINE DIPSTICK: NEGATIVE
KETONES UR: NEGATIVE mg/dL
Leukocytes, UA: NEGATIVE
Nitrite: NEGATIVE
PH: 7 (ref 5.0–8.0)
Protein, ur: NEGATIVE mg/dL
Specific Gravity, Urine: 1.01 (ref 1.005–1.030)
Urobilinogen, UA: 0.2 mg/dL (ref 0.0–1.0)

## 2015-05-12 LAB — WET PREP, GENITAL
TRICH WET PREP: NONE SEEN
Yeast Wet Prep HPF POC: NONE SEEN

## 2015-05-12 LAB — HIV ANTIBODY (ROUTINE TESTING W REFLEX): HIV Screen 4th Generation wRfx: NONREACTIVE

## 2015-05-12 LAB — HCG, QUANTITATIVE, PREGNANCY: HCG, BETA CHAIN, QUANT, S: 52582 m[IU]/mL — AB (ref <5)

## 2015-05-12 LAB — CBC
HEMATOCRIT: 37.9 % (ref 36.0–46.0)
Hemoglobin: 13.4 g/dL (ref 12.0–15.0)
MCH: 30.8 pg (ref 26.0–34.0)
MCHC: 35.4 g/dL (ref 30.0–36.0)
MCV: 87.1 fL (ref 78.0–100.0)
Platelets: 193 10*3/uL (ref 150–400)
RBC: 4.35 MIL/uL (ref 3.87–5.11)
RDW: 13.3 % (ref 11.5–15.5)
WBC: 13 10*3/uL — ABNORMAL HIGH (ref 4.0–10.5)

## 2015-05-12 LAB — ABO/RH: ABO/RH(D): A POS

## 2015-05-12 LAB — GC/CHLAMYDIA PROBE AMP (~~LOC~~) NOT AT ARMC
CHLAMYDIA, DNA PROBE: NEGATIVE
Neisseria Gonorrhea: NEGATIVE

## 2015-05-12 MED ORDER — PROMETHAZINE HCL 25 MG PO TABS: 12.5000 mg | ORAL_TABLET | Freq: Four times a day (QID) | ORAL | Status: DC | PRN

## 2015-05-12 NOTE — MAU Provider Note (Signed)
History     CSN: [id number]  Arrival date and time: 05/11/15 2259   None     Chief Complaint  Patient presents with  . Possible Pregnancy  . Emesis During Pregnancy   HPI Comments: Jade Taylor is a 24 y.o. G3P0110 at [redacted]w[redacted]d who presents today with abdominal pain. She states that she has had the pain off and on. She states that she has had nausea, vomiting and a headache. She had a 26 week IUFD in March. She denies any vaginal bleeding. She has had some white discharge.   Abdominal Pain This is a new problem. The current episode started today. The onset quality is gradual. The problem occurs constantly. The pain is located in the generalized abdominal region. The pain is at a severity of 4/10. The quality of the pain is cramping. The abdominal pain does not radiate. Associated symptoms include nausea and vomiting. Pertinent negatives include no constipation, diarrhea, dysuria, fever or frequency. Nothing aggravates the pain. The pain is relieved by nothing. She has tried nothing for the symptoms.     Past Medical History  Diagnosis Date  . Chronic pain syndrome   . PTSD (post-traumatic stress disorder)     Past Surgical History  Procedure Laterality Date  . Carpal tunnel release    . Wisdom tooth extraction      No family history on file.  History  Substance Use Topics  . Smoking status: Current Some Day Smoker -- 0.50 packs/day for .5 years    Types: Cigarettes  . Smokeless tobacco: Not on file  . Alcohol Use: Yes     Comment: ocassilly    Allergies:  Allergies  Allergen Reactions  . Metronidazole Hives, Shortness Of Breath, Rash and Other (See Comments)    Burning sensation  . Penicillins Shortness Of Breath, Itching and Rash  . Amoxicillin     Prescriptions prior to admission  Medication Sig Dispense Refill Last Dose  . diclofenac (VOLTAREN) 75 MG EC tablet Take 75 mg by mouth 2 (two) times daily.   07/04/2014  . HYDROcodone-acetaminophen (NORCO)  7.5-325 MG per tablet Take 1 tablet by mouth every 4 (four) hours as needed. pain   07/04/2014    Review of Systems  Constitutional: Negative for fever.  Gastrointestinal: Positive for nausea, vomiting and abdominal pain. Negative for diarrhea and constipation.  Genitourinary: Negative for dysuria, urgency and frequency.   Physical Exam   Blood pressure 154/71, pulse 81, temperature 98.7 F (37.1 C), temperature source Oral, resp. rate 18, height 5\' 2"  (1.575 m), weight 86.909 kg (191 lb 9.6 oz), last menstrual period 02/24/2015, SpO2 100 %.  Physical Exam  Nursing note and vitals reviewed. Constitutional: She is oriented to person, place, and time. She appears well-developed and well-nourished. No distress.  HENT:  Head: Normocephalic.  Cardiovascular: Normal rate.   Respiratory: Effort normal.  GI: Soft. There is no tenderness. There is no rebound.  Neurological: She is alert and oriented to person, place, and time.  Skin: Skin is warm and dry.  Psychiatric: She has a normal mood and affect.    Results for orders placed or performed during the hospital encounter of 05/11/15 (from the past 24 hour(s))  Urinalysis, Routine w reflex microscopic (not at [facility])     Status: None   Collection Time: 05/11/15 11:21 PM  Result Value Ref Range   Color, Urine YELLOW YELLOW   APPearance CLEAR CLEAR   Specific Gravity, Urine 1.010 1.005 - 1.030   pH  7.0 5.0 - 8.0   Glucose, UA NEGATIVE NEGATIVE mg/dL   Hgb urine dipstick NEGATIVE NEGATIVE   Bilirubin Urine NEGATIVE NEGATIVE   Ketones, ur NEGATIVE NEGATIVE mg/dL   Protein, ur NEGATIVE NEGATIVE mg/dL   Urobilinogen, UA 0.2 0.0 - 1.0 mg/dL   Nitrite NEGATIVE NEGATIVE   Leukocytes, UA NEGATIVE NEGATIVE  Pregnancy, urine POC     Status: Abnormal   Collection Time: 05/11/15 11:31 PM  Result Value Ref Range   Preg Test, Ur POSITIVE (A) NEGATIVE  CBC     Status: Abnormal   Collection Time: 05/11/15 11:41 PM  Result Value Ref Range   WBC  13.0 (H) 4.0 - 10.5 K/uL   RBC 4.35 3.87 - 5.11 MIL/uL   Hemoglobin 13.4 12.0 - 15.0 g/dL   HCT 14.7 82.9 - 56.2 %   MCV 87.1 78.0 - 100.0 fL   MCH 30.8 26.0 - 34.0 pg   MCHC 35.4 30.0 - 36.0 g/dL   RDW 13.0 86.5 - 78.4 %   Platelets 193 150 - 400 K/uL  ABO/Rh     Status: None (Preliminary result)   Collection Time: 05/11/15 11:41 PM  Result Value Ref Range   ABO/RH(D) A POS   hCG, quantitative, pregnancy     Status: Abnormal   Collection Time: 05/11/15 11:41 PM  Result Value Ref Range   hCG, Beta Chain, Quant, S 52582 (H) <5 mIU/mL   US Ob Comp Less 14 Wks  05/12/2015   CLINICAL DATA:  Pelvic pain. Estimated gestational age by LMP is 10 weeks 6 days. Quantitative beta HCG is pending.  EXAM: OBSTETRIC <14 WK ULTRASOUND  TECHNIQUE: Transabdominal ultrasound was performed for evaluation of the gestation as well as the maternal uterus and adnexal regions.  COMPARISON:  None.  FINDINGS: Intrauterine gestational sac: A single intrauterine pregnancy is demonstrated.  Yolk sac:  Not identified consistent with gestational age.  Embryo:  Present.  Cardiac Activity: Observed.  Heart Rate: 158 bpm  CRL:   36.9  mm   10 w 5 d                  [place] EDC: 12/03/2015  Maternal uterus/adnexae: Uterus is anteverted. No myometrial mass lesions identified. No significant subchorionic hemorrhage. Both ovaries are visualized and appear normal. Corpus luteal cyst shown on the right. No free fluid in the pelvis.  IMPRESSION: Single intrauterine pregnancy. Estimated gestational age by crown-rump length is 10 weeks 5 days. No acute complication is identified.   Electronically Signed   By: Burman Nieves M.D.   On: 05/12/2015 00:40    MAU Course  Procedures  MDM  Assessment and Plan   1. Nausea/vomiting in pregnancy   2. Pelvic pain affecting pregnancy in first trimester, antepartum    DC home Comfort measures reviewed  1st Trimester precautions  Bleeding precautions RX: phenergan #30  Return to MAU as  needed Start [facility] as soon as possible  Follow-up Information    Schedule an appointment as soon as possible for a visit with [facility] [place].   Contact information:   [address] [facility] [address] [phone]        Tawnya Crook 05/12/2015, 12:53 AM

## 2015-05-12 NOTE — Discharge Instructions (Signed)
Prenatal Care Providers °[facility]    [facility] ° Phone- [phone]     Phone: [phone] °         °[facility]                      [facility] ° @[place]     Phone: [phone] ° Phone: [phone] °        [facility] °[facility]     Phone: [phone] ° Phone: [phone]   °        [facility] °[facility]                 hone: [phone] ° Phone: [phone] °        [facility] °Dr. Bernard Marshall      Phone: [phone] ° Phone: [phone] °        [facility] °[facility]                Phone: [phone] ° Women’s Health  ° Phone:[phone]    [facility] °         Phone: [phone] °[facility] °  Phone: [phone] °Safe Medications in Pregnancy  ° °Acne: °Benzoyl Peroxide °Salicylic Acid ° °Backache/Headache: °Tylenol: 2 regular strength every 4 hours OR °             2 Extra strength every 6 hours ° °Colds/Coughs/Allergies: °Benadryl (alcohol free) 25 mg every 6 hours as needed °Breath right strips °Claritin °Cepacol throat lozenges °Chloraseptic throat spray °Cold-Eeze- up to three times per day °Cough drops, alcohol free °Flonase (by prescription only) °Guaifenesin °Mucinex °Robitussin DM (plain only, alcohol free) °Saline nasal spray/drops °Sudafed (pseudoephedrine) & Actifed ** use only after [redacted] weeks gestation and if you do not have high blood pressure °Tylenol °Vicks Vaporub °Zinc lozenges °Zyrtec  ° °Constipation: °Colace °Ducolax suppositories °Fleet enema °Glycerin suppositories °Metamucil °Milk of magnesia °Miralax °Senokot °Smooth move tea ° °Diarrhea: °Kaopectate °Imodium A-D ° °*NO pepto Bismol ° °Hemorrhoids: °Anusol °Anusol HC °Preparation H °Tucks ° °Indigestion: °Tums °Maalox °Mylanta °Zantac  °Pepcid ° °Insomnia: °Benadryl (alcohol free) 25mg every 6 hours as needed °Tylenol  PM °Unisom, no Gelcaps ° °Leg Cramps: °Tums °MagGel ° °Nausea/Vomiting:  °Bonine °Dramamine °Emetrol °Ginger extract °Sea bands °Meclizine  °Nausea medication to take during pregnancy:  °Unisom (doxylamine succinate 25 mg tablets) Take one tablet daily at bedtime. If symptoms are not adequately controlled, the dose can be increased to a maximum recommended dose of two tablets daily (1/2 tablet in the morning, 1/2 tablet mid-afternoon and one at bedtime). °Vitamin B6 100mg tablets. Take one tablet twice a day (up to 200 mg per day). ° °Skin Rashes: °Aveeno products °Benadryl cream or 25mg every 6 hours as needed °Calamine Lotion °1% cortisone cream ° °Yeast infection: °Gyne-lotrimin 7 °Monistat 7 ° ° °**If taking multiple medications, please check labels to avoid duplicating the same active ingredients °**take medication as directed on the label °** Do not exceed 4000 mg of tylenol in 24 hours °**Do not take medications that contain aspirin or ibuprofen ° ° ° ° °

## 2015-09-13 DIAGNOSIS — IMO0002 Reserved for concepts with insufficient information to code with codable children: Secondary | ICD-10-CM

## 2015-09-13 DIAGNOSIS — O4590 Premature separation of placenta, unspecified, unspecified trimester: Secondary | ICD-10-CM

## 2016-01-22 ENCOUNTER — Telehealth: Payer: Self-pay | Admitting: *Deleted

## 2016-01-22 MED ORDER — HYDROCODONE-ACETAMINOPHEN 7.5-325 MG PO TABS: 1.0000 | ORAL_TABLET | ORAL | Status: DC | PRN

## 2016-01-22 NOTE — Telephone Encounter (Signed)
Patient called requesting norco 7.5-325mg  qty 120 to be refilled. Please advise

## 2016-01-22 NOTE — Telephone Encounter (Signed)
Rx done. 

## 2016-01-22 NOTE — Telephone Encounter (Signed)
Prescription available, patient aware  

## 2016-01-25 ENCOUNTER — Encounter: Payer: Self-pay | Admitting: Orthopaedic Surgery

## 2016-01-25 ENCOUNTER — Ambulatory Visit (INDEPENDENT_AMBULATORY_CARE_PROVIDER_SITE_OTHER): Payer: Self-pay | Admitting: Orthopaedic Surgery

## 2016-01-25 VITALS — BP 114/67 | HR 81 | Temp 97.9°F | Ht 62.0 in | Wt 210.6 lb

## 2016-01-25 DIAGNOSIS — M25512 Pain in left shoulder: Secondary | ICD-10-CM

## 2016-01-25 DIAGNOSIS — M25511 Pain in right shoulder: Secondary | ICD-10-CM

## 2016-01-25 NOTE — Progress Notes (Signed)
Patient Jade Taylor, female DOB:September 04, 1991, 25 y.o. YQM:[id number]  Chief Complaint  Patient presents with  . Shoulder Pain    Bilateral    HPI  Jade Taylor is a 25 y.o. female who has chronic shoulder pain.  She has no trauma, no swelling, no paresthesias.  Shoulder Pain  The pain is present in the left shoulder and right shoulder. This is a chronic problem. The current episode started more than 1 year ago. The problem occurs daily. The problem has been waxing and waning. The quality of the pain is described as aching. The pain is at a severity of 4/10. The pain is mild. She has tried acetaminophen, oral narcotics and rest for the symptoms. The treatment provided moderate relief.    Body mass index is 38.51 kg/(m^2).  Review of Systems  Constitutional:       Patient does not have Diabetes Mellitus. Patient does not have hypertension. Patient does not have COPD or shortness of breath. Patient has BMI > 35. Patient has current smoking history.  Musculoskeletal: Positive for myalgias, arthralgias and neck pain.    Past Medical History  Diagnosis Date  . Chronic pain syndrome   . PTSD (post-traumatic stress disorder)     Past Surgical History  Procedure Laterality Date  . Carpal tunnel release    . Wisdom tooth extraction      No family history on file.  Social History Social History  Substance Use Topics  . Smoking status: Current Some Day Smoker -- 0.50 packs/day for .5 years    Types: Cigarettes  . Smokeless tobacco: None  . Alcohol Use: Yes     Comment: ocassilly    Allergies  Allergen Reactions  . Metronidazole Hives, Shortness Of Breath, Rash and Other (See Comments)    Burning sensation  . Penicillins Shortness Of Breath, Itching and Rash  . Amoxicillin     Current Outpatient Prescriptions  Medication Sig Dispense Refill  . HYDROcodone-acetaminophen (NORCO) 7.5-325 MG tablet Take 1 tablet by mouth every 4 (four) hours as needed for  moderate pain (Must last 30 days.  Do not drive or operate machinery while taking this medicine.). 120 tablet 0  . Multiple Vitamin (MULTIVITAMIN) tablet Take 1 tablet by mouth daily.    . naproxen (NAPROSYN) 500 MG tablet Take 500 mg by mouth 2 (two) times daily with a meal.    . promethazine (PHENERGAN) 25 MG tablet Take 0.5-1 tablets (12.5-25 mg total) by mouth every 6 (six) hours as needed. 30 tablet 0   No current facility-administered medications for this visit.     Physical Exam  Blood pressure 114/67, pulse 81, temperature 97.9 F (36.6 C), height  (1.575 m), weight 210 lb 9.6 oz (95.528 kg), last menstrual period 02/24/2015.  Constitutional: overall normal hygiene, normal nutrition, well developed, normal grooming, normal body habitus. Assistive device:none  Musculoskeletal: gait and station Limp none, muscle tone and strength are normal, no tremors or atrophy is present.  .  Neurological: coordination overall normal.  Deep tendon reflex/nerve stretch intact.  Sensation normal.  Cranial nerves II-XII intact.   Skin:   normal overall no scars, lesions, ulcers or rashes. No psoriasis.  Psychiatric: Alert and oriented x 3.  Recent memory intact, remote memory unclear.  Normal mood and affect. Well groomed.  Good eye contact.  Cardiovascular: overall no swelling, no varicosities, no edema bilaterally, normal temperatures of the legs and arms, no clubbing, cyanosis and good capillary refill.  Lymphatic: palpation is normal.  Extremities:both shoulders are tender Inspection appearance of both shoulders and upper extremities is normal Strength and tone normal both upper extremities Range of motion full of both shoulders with more tenderness in full overhead positioning.  She has some pain to resisted abduction.  She declines to stop smoking  The patient has been educated about the nature of the problem(s) and counseled on treatment options.  The patient appeared to  understand what I have discussed and is in agreement with it.  PLAN Call if any problems.  Precautions discussed.  Continue current medications.   Return to clinic 3 months

## 2016-01-25 NOTE — Patient Instructions (Signed)

## 2016-02-20 ENCOUNTER — Telehealth: Payer: Self-pay | Admitting: Orthopaedic Surgery

## 2016-02-20 MED ORDER — HYDROCODONE-ACETAMINOPHEN 7.5-325 MG PO TABS: 1.0000 | ORAL_TABLET | ORAL | Status: DC | PRN

## 2016-02-20 NOTE — Telephone Encounter (Signed)
Patient requesting refill of Norco

## 2016-02-20 NOTE — Telephone Encounter (Signed)
Rx done. 

## 2016-03-15 ENCOUNTER — Encounter (HOSPITAL_COMMUNITY): Payer: Self-pay | Admitting: *Deleted

## 2016-03-20 ENCOUNTER — Telehealth: Payer: Self-pay | Admitting: Orthopaedic Surgery

## 2016-03-20 MED ORDER — HYDROCODONE-ACETAMINOPHEN 5-325 MG PO TABS: 1.0000 | ORAL_TABLET | ORAL | Status: DC | PRN

## 2016-03-20 NOTE — Telephone Encounter (Signed)
Patient called for refill of medication: HYDROcodone-acetaminophen (NORCO) 7.5-325 MG tablet [[id number]][[id number]] quantity 120 - relayed that refill appears to be due 03/22/16; patient aware needs to be reviewed when due for refill.

## 2016-03-20 NOTE — Telephone Encounter (Signed)
Rx Done . 

## 2016-04-18 ENCOUNTER — Telehealth: Payer: Self-pay | Admitting: Orthopaedic Surgery

## 2016-04-18 MED ORDER — HYDROCODONE-ACETAMINOPHEN 5-325 MG PO TABS: 1.0000 | ORAL_TABLET | ORAL | Status: DC | PRN

## 2016-04-18 NOTE — Telephone Encounter (Signed)
Rx done. 

## 2016-04-18 NOTE — Telephone Encounter (Signed)
Hydrocodone-Acetaminophen  5/325mg  Qty 120 Tablets °

## 2016-05-01 ENCOUNTER — Ambulatory Visit (INDEPENDENT_AMBULATORY_CARE_PROVIDER_SITE_OTHER): Payer: Medicaid Other | Admitting: Orthopaedic Surgery

## 2016-05-01 ENCOUNTER — Encounter: Payer: Self-pay | Admitting: Orthopaedic Surgery

## 2016-05-01 VITALS — BP 120/86 | HR 82 | Temp 97.7°F | Ht 62.5 in | Wt 202.4 lb

## 2016-05-01 DIAGNOSIS — M25512 Pain in left shoulder: Secondary | ICD-10-CM

## 2016-05-01 DIAGNOSIS — M25511 Pain in right shoulder: Secondary | ICD-10-CM

## 2016-05-01 NOTE — Progress Notes (Signed)
Patient WU:[id number] E Robin, female DOB:1991/03/02, 25 y.o. GNF:[id number]  Chief Complaint  Patient presents with  . Follow-up    Bilateral shoulder pain left worse than right, hands and elbow    HPI  Jade Taylor is a 25 y.o. female who has chronic shoulder pain bilaterally.  She has more pain on the left.  She has no paresthesias, no trauma, no redness.  She is doing her exercises and taking her medicine.  HPI  Body mass index is 36.41 kg/(m^2).  ROS  Review of Systems  Constitutional:       Patient does not have Diabetes Mellitus. Patient does not have hypertension. Patient does not have COPD or shortness of breath. Patient has BMI > 35. Patient has current smoking history.  HENT: Negative for congestion.   Respiratory: Negative for cough and shortness of breath.   Endocrine: Positive for cold intolerance.  Musculoskeletal: Positive for myalgias, arthralgias and neck pain.  Allergic/Immunologic: Positive for environmental allergies.    Past Medical History  Diagnosis Date  . Chronic pain syndrome   . PTSD (post-traumatic stress disorder)     Past Surgical History  Procedure Laterality Date  . Carpal tunnel release    . Wisdom tooth extraction      History reviewed. No pertinent family history.  Social History Social History  Substance Use Topics  . Smoking status: Current Some Day Smoker -- 0.50 packs/day for .5 years    Types: Cigarettes  . Smokeless tobacco: None  . Alcohol Use: Yes     Comment: ocassilly    Allergies  Allergen Reactions  . Metronidazole Hives, Shortness Of Breath, Rash and Other (See Comments)    Burning sensation  . Penicillins Shortness Of Breath, Itching and Rash  . Amoxicillin     Current Outpatient Prescriptions  Medication Sig Dispense Refill  . HYDROcodone-acetaminophen (NORCO/VICODIN) 5-325 MG tablet Take 1 tablet by mouth every 4 (four) hours as needed for moderate pain (Must last 30 days.  Do not take and drive  a car or use machinery.). 120 tablet 0  . Multiple Vitamin (MULTIVITAMIN) tablet Take 1 tablet by mouth daily.    . naproxen (NAPROSYN) 500 MG tablet Take 500 mg by mouth 2 (two) times daily with a meal.    . promethazine (PHENERGAN) 25 MG tablet Take 0.5-1 tablets (12.5-25 mg total) by mouth every 6 (six) hours as needed. 30 tablet 0   No current facility-administered medications for this visit.     Physical Exam  Blood pressure 120/86, pulse 82, temperature 97.7 F (36.5 C), height 5' 2.5" (1.588 m), weight 202 lb 6.4 oz (91.808 kg), unknown if currently breastfeeding.  Constitutional: overall normal hygiene, normal nutrition, well developed, normal grooming, normal body habitus. Assistive device:none  Musculoskeletal: gait and station Limp none, muscle tone and strength are normal, no tremors or atrophy is present.  .  Neurological: coordination overall normal.  Deep tendon reflex/nerve stretch intact.  Sensation normal.  Cranial nerves II-XII intact.   Skin:   normal overall no scars, lesions, ulcers or rashes. No psoriasis.  Psychiatric: Alert and oriented x 3.  Recent memory intact, remote memory unclear.  Normal mood and affect. Well groomed.  Good eye contact.  Cardiovascular: overall no swelling, no varicosities, no edema bilaterally, normal temperatures of the legs and arms, no clubbing, cyanosis and good capillary refill.  Lymphatic: palpation is normal.  Both shoulders have full motion today but she has more tenderness of her left shoulder, more with resisted  abduction.  NV is intact.  The patient has been educated about the nature of the problem(s) and counseled on treatment options.  The patient appeared to understand what I have discussed and is in agreement with it.  Encounter Diagnosis  Name Primary?  . Bilateral shoulder pain Yes    PLAN Call if any problems.  Precautions discussed.  Continue current medications.   Return to clinic 3 months    Electronically Signed Jade McleanWayne Maverik Foot, MD 6/7/201711:07 AM

## 2016-05-21 ENCOUNTER — Telehealth: Payer: Self-pay | Admitting: Orthopaedic Surgery

## 2016-05-21 MED ORDER — HYDROCODONE-ACETAMINOPHEN 5-325 MG PO TABS: 1.0000 | ORAL_TABLET | ORAL | Status: DC | PRN

## 2016-05-21 NOTE — Telephone Encounter (Signed)
Hydrocodone-Acetaminophen  5/325mg  Qty 120 Tablets °

## 2016-05-21 NOTE — Telephone Encounter (Signed)
Rx done. 

## 2016-06-19 ENCOUNTER — Telehealth: Payer: Self-pay

## 2016-06-19 MED ORDER — HYDROCODONE-ACETAMINOPHEN 5-325 MG PO TABS: 1.0000 | ORAL_TABLET | ORAL | 0 refills | Status: DC | PRN

## 2016-07-01 ENCOUNTER — Inpatient Hospital Stay (HOSPITAL_COMMUNITY)
Admission: AD | Admit: 2016-07-01 | Discharge: 2016-07-01 | Disposition: A | Discharge status: Home / Self Care | Payer: Medicaid Other | Source: Ambulatory Visit | Attending: Obstetrics & Gynecology | Admitting: Obstetrics & Gynecology

## 2016-07-01 ENCOUNTER — Encounter (HOSPITAL_COMMUNITY): Payer: Self-pay | Admitting: *Deleted

## 2016-07-01 DIAGNOSIS — Z888 Allergy status to other drugs, medicaments and biological substances status: Secondary | ICD-10-CM | POA: Insufficient documentation

## 2016-07-01 DIAGNOSIS — G894 Chronic pain syndrome: Secondary | ICD-10-CM | POA: Insufficient documentation

## 2016-07-01 DIAGNOSIS — M2550 Pain in unspecified joint: Secondary | ICD-10-CM | POA: Diagnosis not present

## 2016-07-01 DIAGNOSIS — F17210 Nicotine dependence, cigarettes, uncomplicated: Secondary | ICD-10-CM | POA: Insufficient documentation

## 2016-07-01 DIAGNOSIS — F4310 Post-traumatic stress disorder, unspecified: Secondary | ICD-10-CM | POA: Insufficient documentation

## 2016-07-01 DIAGNOSIS — Z880 Allergy status to penicillin: Secondary | ICD-10-CM | POA: Diagnosis not present

## 2016-07-01 DIAGNOSIS — Z8759 Personal history of other complications of pregnancy, childbirth and the puerperium: Secondary | ICD-10-CM | POA: Diagnosis not present

## 2016-07-01 DIAGNOSIS — Z3201 Encounter for pregnancy test, result positive: Secondary | ICD-10-CM | POA: Diagnosis not present

## 2016-07-01 NOTE — MAU Note (Signed)
Been sick off and on.  Took a preg test today it was positive.  Has had 2 stillbirths and a miscarriage so it kind of worries her. Has had some back pian- none currently. Had some bleeding a few wks ago- slight dicoloration- only when she wiped.  Has been getting sick in the mornings.

## 2016-07-01 NOTE — Discharge Instructions (Signed)
Recurrent Pregnancy Loss Recurrent pregnancy loss is the loss of three or more pregnancies before 20 weeks of gestation. Losing three or more pregnancies in a row is rare.  CAUSES  The most common cause of recurrent pregnancy loss is an abnormal number of chromosomes in the developing baby (fetus). Chromosomes are the structures inside cells that hold all your genetic material. In most cases of recurrent pregnancy loss, a missing or extra chromosome keeps a baby from developing. It may not be possible to identify which chromosome is defective. Chromosome abnormalities can be inherited, but most of the time they occur by chance. Other possible causes of recurrent pregnancy loss include:  Being born with an abnormal womb structure (septate uterus).  Having noncancerous growths in your uterus (fibroids or polyps).  Having a disease that causes scarring in your uterus (Asherman syndrome).  Having a disease that causes your blood to clot (antiphospholipid syndrome).  Having a disease that increases bleeding (thrombophilia). RISK FACTORS The risk of recurrent pregnancy loss increases as your age increases. Other risk factors include:  Diabetes.  Thyroid disease.  Obesity.  Smoking.  Excessive alcohol or caffeine.  Recreational drug use. SIGNS AND SYMPTOMS  Vaginal bleeding.  Passing clots vaginally.  Abdominal pain or cramps.  Low back pain. DIAGNOSIS  Your health care provider can create an image of your womb using sound waves and a computer (ultrasound) to confirm your pregnancy by 5 or 6 weeks after you conceive. Ultrasound can also confirm a pregnancy loss. Your health care provider may do a complete physical exam, including your vagina and uterus (pelvic exam), to find possible causes of recurrent pregnancy loss.  Other tests may include:  Ultrasound imaging to see if the structure of your uterus is normal or if you have polyps or fibroids.  Blood tests to see if you have a  disease that causes recurrent pregnancy loss.  Blood tests to see if your blood clots normally.  Genetic testing of you and your partner. TREATMENT  In many cases, there is no specific treatment. Depending on the cause, possible treatments include:  Having your eggs fertilized outside your uterus (in vitro fertilization). By doing this, a health care provider may be able to select eggs without chromosome abnormalities.  Taking a blood thinner to prevent clotting if you have antiphospholipid syndrome.  Having corrective surgery if you have an abnormality in your uterus. HOME CARE INSTRUCTIONS  Follow all your health care provider's home instructions carefully. These may include:  Do not smoke or use recreational drugs.  Do not drink alcohol if you are pregnant.  Do not put anything in your vagina or have sex for 2 weeks after you have had a miscarriage.  If you are Rh negative and have had a miscarriage, you may need to get a shot of Rho (D) immune globulin. Ask your doctor if you need this shot.  Use birth control if you do not want to get pregnant. You can get pregnant [redacted] weeks after a miscarriage.  Get support from friends and loved ones. Miscarriage can be a sad and stressful event. SEEK MEDICAL CARE IF:   You have light vaginal bleeding or spotting while pregnant.  You have been trying to get pregnant without success.  You are struggling with sadness or depression after a miscarriage. SEEK IMMEDIATE MEDICAL CARE IF:  You have heavy vaginal bleeding and abdominal cramps.  You have fever, chills, and severe abdominal pain.   This information is not intended to replace   advice given to you by your health care provider. Make sure you discuss any questions you have with your health care provider.   Document Released: 04/29/2008 Document Revised: 11/16/2013 Document Reviewed: 09/03/2013 Elsevier Interactive Patient Education 2016 [employer]  

## 2016-07-01 NOTE — MAU Provider Note (Signed)
  History     CSN: [id number]  Arrival date and time: 07/01/16 1840   First Provider Initiated Contact with Patient 07/01/16 1912      Chief Complaint  Patient presents with  . Possible Pregnancy   HPI Pt here for Confirmation of pregnancy. She doe is worried because she has had 2 miscarriages. She does not have any problems today. She denies cramping bleeding pain. [place] Dr. Karlton [place] [facility]. [facility]. I'm sending a note to Dr. Despina HiddenEure.     Past Medical History:  Diagnosis Date  . Chronic pain syndrome   . PTSD (post-traumatic stress disorder)     Past Surgical History:  Procedure Laterality Date  . CARPAL TUNNEL RELEASE    . WISDOM TOOTH EXTRACTION      No family history on file.  Social History  Substance Use Topics  . Smoking status: Current Some Day Smoker    Packs/day: 0.50    Years: 0.50    Types: Cigarettes  . Smokeless tobacco: Not on file  . Alcohol use Yes     Comment: ocassilly    Allergies:  Allergies  Allergen Reactions  . Metronidazole Hives, Shortness Of Breath, Rash and Other (See Comments)    Burning sensation  . Penicillins Shortness Of Breath, Itching and Rash  . Amoxicillin     Prescriptions Prior to Admission  Medication Sig Dispense Refill Last Dose  . HYDROcodone-acetaminophen (NORCO/VICODIN) 5-325 MG tablet Take 1 tablet by mouth every 4 (four) hours as needed for moderate pain (Must last 30 days.  Do not take and drive a car or use machinery.). 120 tablet 0   . Multiple Vitamin (MULTIVITAMIN) tablet Take 1 tablet by mouth daily.   Taking  . naproxen (NAPROSYN) 500 MG tablet Take 500 mg by mouth 2 (two) times daily with a meal.   Taking  . promethazine (PHENERGAN) 25 MG tablet Take 0.5-1 tablets (12.5-25 mg total) by mouth every 6 (six) hours as needed. 30 tablet 0 Taking    Review of Systems  Constitutional: Negative.   Respiratory: Negative.   Cardiovascular: Negative.    Gastrointestinal: Negative.   Genitourinary: Negative.   Musculoskeletal: Positive for joint pain.   Physical Exam   Blood pressure 132/75, pulse 88, temperature 98.5 F (36.9 C), temperature source Oral, resp. rate 18, weight 203 lb 12.8 oz (92.4 kg), last menstrual period 03/29/2016, unknown if currently breastfeeding.  Physical Exam  Constitutional: She appears well-developed and well-nourished. No distress.  Cardiovascular: Normal rate.   Respiratory: Effort normal.  GI: Soft.  Skin: Skin is warm and dry.    MAU Course  Procedures FHT by doppler  Assessment and Plan  [age] year old G3P0020 with positive pregnancy by fetal heart tones. Patient not had any problems today. [facility] to [facility].  Pregnancy verification letter given   LEGGETT,KELLY H. 07/01/2016, 7:13 PM

## 2016-07-02 ENCOUNTER — Other Ambulatory Visit: Payer: Self-pay | Admitting: Obstetrics and Gynecology

## 2016-07-02 DIAGNOSIS — O3680X0 Pregnancy with inconclusive fetal viability, not applicable or unspecified: Secondary | ICD-10-CM

## 2016-07-03 ENCOUNTER — Ambulatory Visit (INDEPENDENT_AMBULATORY_CARE_PROVIDER_SITE_OTHER): Payer: Medicaid Other

## 2016-07-03 DIAGNOSIS — Z3A11 11 weeks gestation of pregnancy: Secondary | ICD-10-CM

## 2016-07-03 DIAGNOSIS — O3680X0 Pregnancy with inconclusive fetal viability, not applicable or unspecified: Secondary | ICD-10-CM | POA: Diagnosis not present

## 2016-07-03 NOTE — Progress Notes (Signed)
US 10+2 wks,single IUP, pos fht 169 bpm,normal ov's bilat,crl 36.9 mm

## 2016-07-10 ENCOUNTER — Encounter: Payer: Self-pay | Admitting: Advanced Practice Midwife

## 2016-07-10 ENCOUNTER — Ambulatory Visit (INDEPENDENT_AMBULATORY_CARE_PROVIDER_SITE_OTHER): Payer: Medicaid Other | Admitting: Advanced Practice Midwife

## 2016-07-10 VITALS — BP 112/80 | HR 72 | Wt 204.0 lb

## 2016-07-10 DIAGNOSIS — Z3491 Encounter for supervision of normal pregnancy, unspecified, first trimester: Secondary | ICD-10-CM

## 2016-07-10 DIAGNOSIS — Z1389 Encounter for screening for other disorder: Secondary | ICD-10-CM | POA: Diagnosis not present

## 2016-07-10 DIAGNOSIS — O0990 Supervision of high risk pregnancy, unspecified, unspecified trimester: Secondary | ICD-10-CM | POA: Insufficient documentation

## 2016-07-10 DIAGNOSIS — Z3A12 12 weeks gestation of pregnancy: Secondary | ICD-10-CM

## 2016-07-10 DIAGNOSIS — F4310 Post-traumatic stress disorder, unspecified: Secondary | ICD-10-CM

## 2016-07-10 DIAGNOSIS — G894 Chronic pain syndrome: Secondary | ICD-10-CM

## 2016-07-10 DIAGNOSIS — Z331 Pregnant state, incidental: Secondary | ICD-10-CM

## 2016-07-10 DIAGNOSIS — J45909 Unspecified asthma, uncomplicated: Secondary | ICD-10-CM | POA: Insufficient documentation

## 2016-07-10 DIAGNOSIS — Z0283 Encounter for blood-alcohol and blood-drug test: Secondary | ICD-10-CM

## 2016-07-10 DIAGNOSIS — O0991 Supervision of high risk pregnancy, unspecified, first trimester: Secondary | ICD-10-CM | POA: Diagnosis not present

## 2016-07-10 DIAGNOSIS — Z87898 Personal history of other specified conditions: Secondary | ICD-10-CM | POA: Insufficient documentation

## 2016-07-10 DIAGNOSIS — O09291 Supervision of pregnancy with other poor reproductive or obstetric history, first trimester: Secondary | ICD-10-CM

## 2016-07-10 DIAGNOSIS — J4520 Mild intermittent asthma, uncomplicated: Secondary | ICD-10-CM

## 2016-07-10 DIAGNOSIS — F1191 Opioid use, unspecified, in remission: Secondary | ICD-10-CM | POA: Insufficient documentation

## 2016-07-10 DIAGNOSIS — O99321 Drug use complicating pregnancy, first trimester: Secondary | ICD-10-CM

## 2016-07-10 DIAGNOSIS — F1190 Opioid use, unspecified, uncomplicated: Secondary | ICD-10-CM

## 2016-07-10 DIAGNOSIS — Z3682 Encounter for antenatal screening for nuchal translucency: Secondary | ICD-10-CM

## 2016-07-10 DIAGNOSIS — Z124 Encounter for screening for malignant neoplasm of cervix: Secondary | ICD-10-CM

## 2016-07-10 DIAGNOSIS — Z369 Encounter for antenatal screening, unspecified: Secondary | ICD-10-CM

## 2016-07-10 LAB — POCT URINALYSIS DIPSTICK
GLUCOSE UA: NEGATIVE
Ketones, UA: NEGATIVE
Leukocytes, UA: NEGATIVE
NITRITE UA: NEGATIVE
PROTEIN UA: NEGATIVE
RBC UA: NEGATIVE
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
## 2016-07-10 NOTE — Patient Instructions (Signed)
 First Trimester of Pregnancy The first trimester of pregnancy is from week 1 until the end of week 12 (months 1 through 3). A week after a sperm fertilizes an egg, the egg will implant on the wall of the uterus. This embryo will begin to develop into a baby. Genes from you and your partner are forming the baby. The female genes determine whether the baby is a boy or a girl. At 6-8 weeks, the eyes and face are formed, and the heartbeat can be seen on ultrasound. At the end of 12 weeks, all the baby's organs are formed.  Now that you are pregnant, you will want to do everything you can to have a healthy baby. Two of the most important things are to get good prenatal care and to follow your health care provider's instructions. Prenatal care is all the medical care you receive before the baby's birth. This care will help prevent, find, and treat any problems during the pregnancy and childbirth. BODY CHANGES Your body goes through many changes during pregnancy. The changes vary from woman to woman.   You may gain or lose a couple of pounds at first.  You may feel sick to your stomach (nauseous) and throw up (vomit). If the vomiting is uncontrollable, call your health care provider.  You may tire easily.  You may develop headaches that can be relieved by medicines approved by your health care provider.  You may urinate more often. Painful urination may mean you have a bladder infection.  You may develop heartburn as a result of your pregnancy.  You may develop constipation because certain hormones are causing the muscles that push waste through your intestines to slow down.  You may develop hemorrhoids or swollen, bulging veins (varicose veins).  Your breasts may begin to grow larger and become tender. Your nipples may stick out more, and the tissue that surrounds them (areola) may become darker.  Your gums may bleed and may be sensitive to brushing and flossing.  Dark spots or blotches  (chloasma, mask of pregnancy) may develop on your face. This will likely fade after the baby is born.  Your menstrual periods will stop.  You may have a loss of appetite.  You may develop cravings for certain kinds of food.  You may have changes in your emotions from day to day, such as being excited to be pregnant or being concerned that something may go wrong with the pregnancy and baby.  You may have more vivid and strange dreams.  You may have changes in your hair. These can include thickening of your hair, rapid growth, and changes in texture. Some women also have hair loss during or after pregnancy, or hair that feels dry or thin. Your hair will most likely return to normal after your baby is born. WHAT TO EXPECT AT YOUR PRENATAL VISITS During a routine prenatal visit:  You will be weighed to make sure you and the baby are growing normally.  Your blood pressure will be taken.  Your abdomen will be measured to track your baby's growth.  The fetal heartbeat will be listened to starting around week 10 or 12 of your pregnancy.  Test results from any previous visits will be discussed. Your health care provider may ask you:  How you are feeling.  If you are feeling the baby move.  If you have had any abnormal symptoms, such as leaking fluid, bleeding, severe headaches, or abdominal cramping.  If you have any questions. Other   tests that may be performed during your first trimester include:  Blood tests to find your blood type and to check for the presence of any previous infections. They will also be used to check for low iron levels (anemia) and Rh antibodies. Later in the pregnancy, blood tests for diabetes will be done along with other tests if problems develop.  Urine tests to check for infections, diabetes, or protein in the urine.  An ultrasound to confirm the proper growth and development of the baby.  An amniocentesis to check for possible genetic problems.  Fetal  screens for spina bifida and Down syndrome.  You may need other tests to make sure you and the baby are doing well. HOME CARE INSTRUCTIONS  Medicines  Follow your health care provider's instructions regarding medicine use. Specific medicines may be either safe or unsafe to take during pregnancy.  Take your prenatal vitamins as directed.  If you develop constipation, try taking a stool softener if your health care provider approves. Diet  Eat regular, well-balanced meals. Choose a variety of foods, such as meat or vegetable-based protein, fish, milk and low-fat dairy products, vegetables, fruits, and whole grain breads and cereals. Your health care provider will help you determine the amount of weight gain that is right for you.  Avoid raw meat and uncooked cheese. These carry germs that can cause birth defects in the baby.  Eating four or five small meals rather than three large meals a day may help relieve nausea and vomiting. If you start to feel nauseous, eating a few soda crackers can be helpful. Drinking liquids between meals instead of during meals also seems to help nausea and vomiting.  If you develop constipation, eat more high-fiber foods, such as fresh vegetables or fruit and whole grains. Drink enough fluids to keep your urine clear or pale yellow. Activity and Exercise  Exercise only as directed by your health care provider. Exercising will help you:  Control your weight.  Stay in shape.  Be prepared for labor and delivery.  Experiencing pain or cramping in the lower abdomen or low back is a good sign that you should stop exercising. Check with your health care provider before continuing normal exercises.  Try to avoid standing for long periods of time. Move your legs often if you must stand in one place for a long time.  Avoid heavy lifting.  Wear low-heeled shoes, and practice good posture.  You may continue to have sex unless your health care provider directs you  otherwise. Relief of Pain or Discomfort  Wear a good support bra for breast tenderness.   Take warm sitz baths to soothe any pain or discomfort caused by hemorrhoids. Use hemorrhoid cream if your health care provider approves.   Rest with your legs elevated if you have leg cramps or low back pain.  If you develop varicose veins in your legs, wear support hose. Elevate your feet for 15 minutes, 3-4 times a day. Limit salt in your diet. Prenatal Care  Schedule your prenatal visits by the twelfth week of pregnancy. They are usually scheduled monthly at first, then more often in the last 2 months before delivery.  Write down your questions. Take them to your prenatal visits.  Keep all your prenatal visits as directed by your health care provider. Safety  Wear your seat belt at all times when driving.  Make a list of emergency phone numbers, including numbers for family, friends, the hospital, and police and fire departments. General   Tips  Ask your health care provider for a referral to a local prenatal education class. Begin classes no later than at the beginning of month 6 of your pregnancy.  Ask for help if you have counseling or nutritional needs during pregnancy. Your health care provider can offer advice or refer you to specialists for help with various needs.  Do not use hot tubs, steam rooms, or saunas.  Do not douche or use tampons or scented sanitary pads.  Do not cross your legs for long periods of time.  Avoid cat litter boxes and soil used by cats. These carry germs that can cause birth defects in the baby and possibly loss of the fetus by miscarriage or stillbirth.  Avoid all smoking, herbs, alcohol, and medicines not prescribed by your health care provider. Chemicals in these affect the formation and growth of the baby.  Schedule a dentist appointment. At home, brush your teeth with a soft toothbrush and be gentle when you floss. SEEK MEDICAL CARE IF:   You have  dizziness.  You have mild pelvic cramps, pelvic pressure, or nagging pain in the abdominal area.  You have persistent nausea, vomiting, or diarrhea.  You have a bad smelling vaginal discharge.  You have pain with urination.  You notice increased swelling in your face, hands, legs, or ankles. SEEK IMMEDIATE MEDICAL CARE IF:   You have a fever.  You are leaking fluid from your vagina.  You have spotting or bleeding from your vagina.  You have severe abdominal cramping or pain.  You have rapid weight gain or loss.  You vomit blood or material that looks like coffee grounds.  You are exposed to German measles and have never had them.  You are exposed to fifth disease or chickenpox.  You develop a severe headache.  You have shortness of breath.  You have any kind of trauma, such as from a fall or a car accident. Document Released: 11/05/2001 Document Revised: 03/28/2014 Document Reviewed: 09/21/2013 [employer] Patient Information 2015 [employer]. This information is not intended to replace advice given to you by your health care provider. Make sure you discuss any questions you have with your health care provider.   Nausea & Vomiting  Have saltine crackers or pretzels by your bed and eat a few bites before you raise your head out of bed in the morning  Eat small frequent meals throughout the day instead of large meals  Drink plenty of fluids throughout the day to stay hydrated, just don't drink a lot of fluids with your meals.  This can make your stomach fill up faster making you feel sick  Do not brush your teeth right after you eat  Products with real ginger are good for nausea, like ginger ale and ginger hard candy Make sure it says made with real ginger!  Sucking on sour candy like lemon heads is also good for nausea  If your prenatal vitamins make you nauseated, take them at night so you will sleep through the nausea  Sea Bands  If you feel like you need  medicine for the nausea & vomiting please let us know  If you are unable to keep any fluids or food down please let us know   Constipation  Drink plenty of fluid, preferably water, throughout the day  Eat foods high in fiber such as fruits, vegetables, and grains  Exercise, such as walking, is a good way to keep your bowels regular  Drink warm fluids, especially warm   prune juice, or decaf coffee  Eat a 1/2 cup of real oatmeal (not instant), 1/2 cup applesauce, and 1/2-1 cup warm prune juice every day  If needed, you may take Colace (docusate sodium) stool softener once or twice a day to help keep the stool soft. If you are pregnant, wait until you are out of your first trimester (12-14 weeks of pregnancy)  If you still are having problems with constipation, you may take Miralax once daily as needed to help keep your bowels regular.  If you are pregnant, wait until you are out of your first trimester (12-14 weeks of pregnancy)  Safe Medications in Pregnancy   Acne: Benzoyl Peroxide Salicylic Acid  Backache/Headache: Tylenol: 2 regular strength every 4 hours OR              2 Extra strength every 6 hours  Colds/Coughs/Allergies: Benadryl (alcohol free) 25 mg every 6 hours as needed Breath right strips Claritin Cepacol throat lozenges Chloraseptic throat spray Cold-Eeze- up to three times per day Cough drops, alcohol free Flonase (by prescription only) Guaifenesin Mucinex Robitussin DM (plain only, alcohol free) Saline nasal spray/drops Sudafed (pseudoephedrine) & Actifed ** use only after [redacted] weeks gestation and if you do not have high blood pressure Tylenol Vicks Vaporub Zinc lozenges Zyrtec   Constipation: Colace Ducolax suppositories Fleet enema Glycerin suppositories Metamucil Milk of magnesia Miralax Senokot Smooth move tea  Diarrhea: Kaopectate Imodium A-D  *NO pepto Bismol  Hemorrhoids: Anusol Anusol HC Preparation  H Tucks  Indigestion: Tums Maalox Mylanta Zantac  Pepcid  Insomnia: Benadryl (alcohol free) 25mg every 6 hours as needed Tylenol PM Unisom, no Gelcaps  Leg Cramps: Tums MagGel  Nausea/Vomiting:  Bonine Dramamine Emetrol Ginger extract Sea bands Meclizine  Nausea medication to take during pregnancy:  Unisom (doxylamine succinate 25 mg tablets) Take one tablet daily at bedtime. If symptoms are not adequately controlled, the dose can be increased to a maximum recommended dose of two tablets daily (1/2 tablet in the morning, 1/2 tablet mid-afternoon and one at bedtime). Vitamin B6 100mg tablets. Take one tablet twice a day (up to 200 mg per day).  Skin Rashes: Aveeno products Benadryl cream or 25mg every 6 hours as needed Calamine Lotion 1% cortisone cream  Yeast infection: Gyne-lotrimin 7 Monistat 7   **If taking multiple medications, please check labels to avoid duplicating the same active ingredients **take medication as directed on the label ** Do not exceed 4000 mg of tylenol in 24 hours **Do not take medications that contain aspirin or ibuprofen      

## 2016-07-12 LAB — URINE CULTURE

## 2016-07-16 ENCOUNTER — Encounter: Payer: Self-pay | Admitting: Women's Health

## 2016-07-17 ENCOUNTER — Other Ambulatory Visit: Payer: Self-pay

## 2016-07-17 ENCOUNTER — Ambulatory Visit (INDEPENDENT_AMBULATORY_CARE_PROVIDER_SITE_OTHER): Payer: Medicaid Other

## 2016-07-17 DIAGNOSIS — Z36 Encounter for antenatal screening of mother: Secondary | ICD-10-CM

## 2016-07-17 DIAGNOSIS — Z3A13 13 weeks gestation of pregnancy: Secondary | ICD-10-CM

## 2016-07-17 DIAGNOSIS — Z3682 Encounter for antenatal screening for nuchal translucency: Secondary | ICD-10-CM

## 2016-07-17 DIAGNOSIS — Z3491 Encounter for supervision of normal pregnancy, unspecified, first trimester: Secondary | ICD-10-CM

## 2016-07-17 LAB — CBC
HEMOGLOBIN: 13.8 g/dL (ref 11.1–15.9)
Hematocrit: 39.8 % (ref 34.0–46.6)
MCH: 30.2 pg (ref 26.6–33.0)
MCHC: 34.7 g/dL (ref 31.5–35.7)
MCV: 87 fL (ref 79–97)
Platelets: 208 10*3/uL (ref 150–379)
RBC: 4.57 x10E6/uL (ref 3.77–5.28)
RDW: 13.6 % (ref 12.3–15.4)
WBC: 12.9 10*3/uL — ABNORMAL HIGH (ref 3.4–10.8)

## 2016-07-17 LAB — URINALYSIS, ROUTINE W REFLEX MICROSCOPIC
BILIRUBIN UA: NEGATIVE
Glucose, UA: NEGATIVE
Ketones, UA: NEGATIVE
LEUKOCYTES UA: NEGATIVE
Nitrite, UA: NEGATIVE
PH UA: 7 (ref 5.0–7.5)
Protein, UA: NEGATIVE
RBC UA: NEGATIVE
Specific Gravity, UA: 1.017 (ref 1.005–1.030)
Urobilinogen, Ur: 0.2 mg/dL (ref 0.2–1.0)

## 2016-07-17 LAB — HIV ANTIBODY (ROUTINE TESTING W REFLEX): HIV SCREEN 4TH GENERATION: NONREACTIVE

## 2016-07-17 LAB — PMP SCREEN PROFILE (10S), URINE
Amphetamine Screen, Ur: NEGATIVE ng/mL
BENZODIAZEPINE SCREEN, URINE: NEGATIVE ng/mL
Barbiturate Screen, Ur: NEGATIVE ng/mL
Cannabinoids Ur Ql Scn: POSITIVE ng/mL
Cocaine(Metab.)Screen, Urine: NEGATIVE ng/mL
Creatinine(Crt), U: 145 mg/dL (ref 20.0–300.0)
Methadone Scn, Ur: NEGATIVE ng/mL
OPIATE SCRN UR: NEGATIVE ng/mL
OXYCODONE+OXYMORPHONE UR QL SCN: NEGATIVE ng/mL
PCP SCRN UR: NEGATIVE ng/mL
Ph of Urine: 6.6 (ref 4.5–8.9)
Propoxyphene, Screen: NEGATIVE ng/mL

## 2016-07-17 LAB — ANTIBODY SCREEN: ANTIBODY SCREEN: NEGATIVE

## 2016-07-17 LAB — ABO/RH: RH TYPE: POSITIVE

## 2016-07-17 LAB — VARICELLA ZOSTER ANTIBODY, IGG: VARICELLA: 2752 {index} (ref >165)

## 2016-07-17 LAB — CYSTIC FIBROSIS MUTATION 97
CF Image: 0
GENE DIS ANAL CARRIER INTERP BLD/T-IMP: NOT DETECTED

## 2016-07-17 LAB — RPR: RPR: NONREACTIVE

## 2016-07-17 LAB — RUBELLA SCREEN: Rubella Antibodies, IGG: 1.48 index (ref >0.99)

## 2016-07-17 LAB — HEPATITIS B SURFACE ANTIGEN: HEP B S AG: NEGATIVE

## 2016-07-17 NOTE — Progress Notes (Signed)
US 12+2 wks,measurements c/w dates, crl 59.2 mm,normal ov's bilat,fhr 154 bpm,post pl gr 0,NB present,NT 1.4 mm

## 2016-07-19 LAB — MATERNAL SCREEN, INTEGRATED #1
Crown Rump Length: 59.2 mm
Gest. Age on Collection Date: 12.4 weeks
MATERNAL AGE AT EDD: 26.6 a
NUCHAL TRANSLUCENCY (NT): 1.4 mm
Number of Fetuses: 1
PAPP-A VALUE: 732.3 ng/mL
PDF: 0
Weight: 204 [lb_av]

## 2016-08-01 ENCOUNTER — Ambulatory Visit (INDEPENDENT_AMBULATORY_CARE_PROVIDER_SITE_OTHER): Payer: Medicaid Other | Admitting: Orthopaedic Surgery

## 2016-08-01 ENCOUNTER — Encounter: Payer: Self-pay | Admitting: Orthopaedic Surgery

## 2016-08-01 VITALS — BP 117/79 | HR 95 | Temp 97.9°F | Ht 63.0 in | Wt 205.0 lb

## 2016-08-01 DIAGNOSIS — M25512 Pain in left shoulder: Secondary | ICD-10-CM | POA: Diagnosis not present

## 2016-08-01 DIAGNOSIS — M25511 Pain in right shoulder: Secondary | ICD-10-CM

## 2016-08-01 DIAGNOSIS — G5600 Carpal tunnel syndrome, unspecified upper limb: Secondary | ICD-10-CM | POA: Diagnosis not present

## 2016-08-01 DIAGNOSIS — O26899 Other specified pregnancy related conditions, unspecified trimester: Secondary | ICD-10-CM

## 2016-08-01 NOTE — Progress Notes (Signed)
Patient ZO:XWRUEAVWU:Jade Taylor, female DOB:1991/05/06, 25 y.o. JWJ:[id number]  Chief Complaint  Patient presents with  . Follow-up    BILATERAL ARM PAIN    HPI  Jade Taylor is a 25 y.o. female who has bilateral shoulder pain and now bilateral carpal tunnel syndrome of pregnancy.  She is off most all medicines.  She will be given cock-up splint for the right wrist which is the most painful. HPI  Body mass index is 36.31 kg/m.  ROS  Review of Systems  Constitutional:       Patient does not have Diabetes Mellitus. Patient does not have hypertension. Patient does not have COPD or shortness of breath. Patient has BMI > 35. Patient has current smoking history.  HENT: Negative for congestion.   Respiratory: Negative for cough and shortness of breath.   Endocrine: Positive for cold intolerance.  Musculoskeletal: Positive for arthralgias, myalgias and neck pain.  Allergic/Immunologic: Positive for environmental allergies.    Past Medical History:  Diagnosis Date  . Asthma   . 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 Hilda LiasKeeling  . Chronic pain syndrome   . PTSD (post-traumatic stress disorder)     Past Surgical History:  Procedure Laterality Date  . CARPAL TUNNEL RELEASE    . WISDOM TOOTH EXTRACTION      Family History  Problem Relation Age of Onset  . Bipolar disorder Mother   . Schizophrenia Mother   . Stroke Father   . Heart disease Maternal Grandmother   . Heart disease Paternal Grandfather     Social History Social History  Substance Use Topics  . Smoking status: Former Smoker    Packs/day: 0.50    Years: 0.50    Types: Cigarettes    Quit date: 07/02/2016  . Smokeless tobacco: Never Used  . Alcohol use No     Comment: ocassilly    Allergies  Allergen Reactions  . Metronidazole Hives, Shortness Of Breath, Rash and Other (See Comments)    Burning sensation  . Penicillins Shortness Of Breath, Itching and Rash  . Amoxicillin      Current Outpatient Prescriptions  Medication Sig Dispense Refill  . HYDROcodone-acetaminophen (NORCO/VICODIN) 5-325 MG tablet Take 1 tablet by mouth every 4 (four) hours as needed for moderate pain (Must last 30 days.  Do not take and drive a car or use machinery.). 120 tablet 0   No current facility-administered medications for this visit.      Physical Exam  Blood pressure 117/79, pulse 95, temperature 97.9 F (36.6 C), height 5\' 3"  (1.6 m), weight 205 lb (93 kg).  Constitutional: overall normal hygiene, normal nutrition, well developed, normal grooming, normal body habitus. Assistive device:none  Musculoskeletal: gait and station Limp none, muscle tone and strength are normal, no tremors or atrophy is present.  .  Neurological: coordination overall normal.  Deep tendon reflex/nerve stretch intact.  Sensation normal.  Cranial nerves II-XII intact.   Skin:   Normal overall no scars, lesions, ulcers or rashes. No psoriasis.  Psychiatric: Alert and oriented x 3.  Recent memory intact, remote memory unclear.  Normal mood and affect. Well groomed.  Good eye contact.  Cardiovascular: overall no swelling, no varicosities, no edema bilaterally, normal temperatures of the legs and arms, no clubbing, cyanosis and good capillary refill.  Lymphatic: palpation is normal.  She has positive Tinel and Phalen on both wrists.  NV intact. Grips normal. ROM full.  The patient has been educated about the nature of  the problem(s) and counseled on treatment options.  The patient appeared to understand what I have discussed and is in agreement with it.  Encounter Diagnoses  Name Primary?  . Bilateral shoulder pain Yes  . Carpal tunnel syndrome during pregnancy     PLAN Call if any problems.  Precautions discussed.  Continue current medications.   Return to clinic 3 months   Electronically Signed Darreld Mclean, MD 9/7/201710:47 AM

## 2016-08-06 ENCOUNTER — Encounter: Payer: Self-pay | Admitting: Women's Health

## 2016-08-07 ENCOUNTER — Encounter: Payer: Self-pay | Admitting: Women's Health

## 2016-08-07 ENCOUNTER — Ambulatory Visit (INDEPENDENT_AMBULATORY_CARE_PROVIDER_SITE_OTHER): Payer: Medicaid Other | Admitting: Women's Health

## 2016-08-07 VITALS — BP 108/62 | HR 80 | Wt 207.0 lb

## 2016-08-07 DIAGNOSIS — Z3A16 16 weeks gestation of pregnancy: Secondary | ICD-10-CM

## 2016-08-07 DIAGNOSIS — O09292 Supervision of pregnancy with other poor reproductive or obstetric history, second trimester: Secondary | ICD-10-CM | POA: Diagnosis not present

## 2016-08-07 DIAGNOSIS — Z331 Pregnant state, incidental: Secondary | ICD-10-CM | POA: Diagnosis not present

## 2016-08-07 DIAGNOSIS — Z363 Encounter for antenatal screening for malformations: Secondary | ICD-10-CM

## 2016-08-07 DIAGNOSIS — O0992 Supervision of high risk pregnancy, unspecified, second trimester: Secondary | ICD-10-CM

## 2016-08-07 DIAGNOSIS — Z1389 Encounter for screening for other disorder: Secondary | ICD-10-CM

## 2016-08-07 DIAGNOSIS — F1290 Cannabis use, unspecified, uncomplicated: Secondary | ICD-10-CM

## 2016-08-07 DIAGNOSIS — Z3682 Encounter for antenatal screening for nuchal translucency: Secondary | ICD-10-CM

## 2016-08-07 DIAGNOSIS — Z3492 Encounter for supervision of normal pregnancy, unspecified, second trimester: Secondary | ICD-10-CM

## 2016-08-07 DIAGNOSIS — Z8759 Personal history of other complications of pregnancy, childbirth and the puerperium: Secondary | ICD-10-CM | POA: Insufficient documentation

## 2016-08-07 LAB — POCT URINALYSIS DIPSTICK
Blood, UA: NEGATIVE
GLUCOSE UA: NEGATIVE
KETONES UA: NEGATIVE
LEUKOCYTES UA: NEGATIVE
NITRITE UA: NEGATIVE
Protein, UA: NEGATIVE

## 2016-08-07 NOTE — Progress Notes (Addendum)
Low-risk OB appointment [id number] Estimated Date of Delivery: 01/27/17 BP 108/62   Pulse 80   Wt 207 lb (93.9 kg)   LMP  (LMP Unknown)   BMI 36.67 kg/m   BP, weight, and urine reviewed.  Refer to obstetrical flow sheet for FH & FHR.  No fm yet. Denies cramping, lof, vb, or uti s/s. No complaints. Reviewed ob hx and correlated w/ records [facility]. H/O IUFD x 2, 1st: 24wks (20wk fetal size) w/ true know, 2nd: 28wks possible abruption, reports neg lab work-up after 2nd IUFD. [facility] to take ASA daily during future pregnancies, so she has been taking as directed. Discussed +THC, states none since right before last visit, advised no further THC use during pregnancy. States no narcotics since +PT, was only taking prn per her report prior to pregnancy. UDS was neg at new ob for opiates.  Reviewed warning s/s to report. Plan:  Continue routine obstetrical care  F/U in 4wks for OB appointment and anatomy u/s 2nd IT today

## 2016-08-07 NOTE — Patient Instructions (Signed)

## 2016-08-09 LAB — MATERNAL SCREEN, INTEGRATED #2
AFP MARKER: 32.4 ng/mL
AFP MoM: 1.43
CROWN RUMP LENGTH: 59.2 mm
DIA MOM: 1.44
DIA Value: 220.6 pg/mL
Estriol, Unconjugated: 0.43 ng/mL
GESTATIONAL AGE: 15.4 wk
Gest. Age on Collection Date: 12.4 weeks
HCG MOM: 0.88
Maternal Age at EDD: 26.6 years
NUCHAL TRANSLUCENCY MOM: 0.92
Nuchal Translucency (NT): 1.4 mm
Number of Fetuses: 1
PAPP-A MOM: 1.14
PAPP-A Value: 732.3 ng/mL
Test Results:: NEGATIVE
Weight: 204 [lb_av]
Weight: 204 [lb_av]
hCG Value: 28.9 IU/mL
uE3 MoM: 0.65

## 2016-08-21 ENCOUNTER — Encounter: Payer: Self-pay | Admitting: Women's Health

## 2016-09-05 ENCOUNTER — Ambulatory Visit (INDEPENDENT_AMBULATORY_CARE_PROVIDER_SITE_OTHER): Payer: Medicaid Other | Admitting: Women's Health

## 2016-09-05 ENCOUNTER — Encounter: Payer: Self-pay | Admitting: Women's Health

## 2016-09-05 ENCOUNTER — Ambulatory Visit (INDEPENDENT_AMBULATORY_CARE_PROVIDER_SITE_OTHER): Payer: Medicaid Other

## 2016-09-05 VITALS — BP 138/78 | HR 72 | Wt 217.0 lb

## 2016-09-05 DIAGNOSIS — O0992 Supervision of high risk pregnancy, unspecified, second trimester: Secondary | ICD-10-CM | POA: Diagnosis not present

## 2016-09-05 DIAGNOSIS — Z331 Pregnant state, incidental: Secondary | ICD-10-CM

## 2016-09-05 DIAGNOSIS — Z3482 Encounter for supervision of other normal pregnancy, second trimester: Secondary | ICD-10-CM

## 2016-09-05 DIAGNOSIS — Z3A20 20 weeks gestation of pregnancy: Secondary | ICD-10-CM

## 2016-09-05 DIAGNOSIS — Z363 Encounter for antenatal screening for malformations: Secondary | ICD-10-CM | POA: Diagnosis not present

## 2016-09-05 DIAGNOSIS — O09292 Supervision of pregnancy with other poor reproductive or obstetric history, second trimester: Secondary | ICD-10-CM

## 2016-09-05 DIAGNOSIS — Z1389 Encounter for screening for other disorder: Secondary | ICD-10-CM

## 2016-09-05 LAB — POCT URINALYSIS DIPSTICK
Glucose, UA: NEGATIVE
KETONES UA: NEGATIVE
Leukocytes, UA: NEGATIVE
Nitrite, UA: NEGATIVE
Protein, UA: NEGATIVE
RBC UA: NEGATIVE

## 2016-09-05 NOTE — Progress Notes (Signed)
US 19+3 wks,cephalic,post.pl gr 0,normal ov's bilat,svp of fluid 5 cm,fhr 151 bpm,efw 335 g,anatomy complete,no obvious abnormalities seen

## 2016-09-05 NOTE — Progress Notes (Signed)
Low-risk OB appointment [id number] Estimated Date of Delivery: 01/27/17 BP 138/78   Pulse 72   Wt 217 lb (98.4 kg)   LMP  (LMP Unknown)   BMI 38.44 kg/m   BP, weight, and urine reviewed.  Refer to obstetrical flow sheet for FH & FHR.  Reports good fm.  Denies regular uc's, lof, vb, or uti s/s. Lt side sciatica pain- gave printed relief measures/exercises Reviewed today's normal anatomy u/s, warning s/s, fm. Plan:  Continue routine obstetrical care  F/U in 4wks for OB appointment and efw/afi u/s d/t h/o IUFD x 2 Declined flu shot

## 2016-09-05 NOTE — Patient Instructions (Addendum)
[place] Pediatricians/Family Doctors:  [facility] [phone]                 [place] [phone] (usually not accepting new patients unless you have family there already, you are always welcome to call and ask)            [facility] ([place]) [phone]   [facility] Pediatricians/Family Doctors:   [place]: [phone]  [facility]: [phone]   Second Trimester of Pregnancy The second trimester is from week 13 through week 28, months 4 through 6. The second trimester is often a time when you feel your best. Your body has also adjusted to being pregnant, and you begin to feel better physically. Usually, morning sickness has lessened or quit completely, you may have more energy, and you may have an increase in appetite. The second trimester is also a time when the fetus is growing rapidly. At the end of the sixth month, the fetus is about 9 inches long and weighs about 1 pounds. You will likely begin to feel the baby move (quickening) between 18 and 20 weeks of the pregnancy. BODY CHANGES Your body goes through many changes during pregnancy. The changes vary from woman to woman.   Your weight will continue to increase. You will notice your lower abdomen bulging out.  You may begin to get stretch marks on your hips, abdomen, and breasts.  You may develop headaches that can be relieved by medicines approved by your health care provider.  You may urinate more often because the fetus is pressing on your bladder.  You may develop or continue to have heartburn as a result of your pregnancy.  You may develop constipation because certain hormones are causing the muscles that push waste through your intestines to slow down.  You may develop hemorrhoids or swollen, bulging veins (varicose veins).  You may have back pain because of the weight  gain and pregnancy hormones relaxing your joints between the bones in your pelvis and as a result of a shift in weight and the muscles that support your balance.  Your breasts will continue to grow and be tender.  Your gums may bleed and may be sensitive to brushing and flossing.  Dark spots or blotches (chloasma, mask of pregnancy) may develop on your face. This will likely fade after the baby is born.  A dark line from your belly button to the pubic area (linea nigra) may appear. This will likely fade after the baby is born.  You may have changes in your hair. These can include thickening of your hair, rapid growth, and changes in texture. Some women also have hair loss during or after pregnancy, or hair that feels dry or thin. Your hair will most likely return to normal after your baby is born. WHAT TO EXPECT AT YOUR PRENATAL VISITS During a routine prenatal visit:  You will be weighed to make sure you and the fetus are growing normally.  Your blood pressure will be taken.  Your abdomen will be measured to track your baby's growth.  The fetal heartbeat will be listened to.  Any test results from the previous visit will be discussed. Your health care provider may ask you:  How you are feeling.  If you are feeling the baby move.  If you have had any abnormal symptoms, such as leaking fluid, bleeding, severe  headaches, or abdominal cramping.  If you are using any tobacco products, including cigarettes, chewing tobacco, and electronic cigarettes.  If you have any questions. Other tests that may be performed during your second trimester include:  Blood tests that check for:  Low iron levels (anemia).  Gestational diabetes (between 24 and 28 weeks).  Rh antibodies.  Urine tests to check for infections, diabetes, or protein in the urine.  An ultrasound to confirm the proper growth and development of the baby.  An amniocentesis to check for possible genetic  problems.  Fetal screens for spina bifida and Down syndrome.  HIV (human immunodeficiency virus) testing. Routine prenatal testing includes screening for HIV, unless you choose not to have this test. HOME CARE INSTRUCTIONS   Avoid all smoking, herbs, alcohol, and unprescribed drugs. These chemicals affect the formation and growth of the baby.  Do not use any tobacco products, including cigarettes, chewing tobacco, and electronic cigarettes. If you need help quitting, ask your health care provider. You may receive counseling support and other resources to help you quit.  Follow your health care provider's instructions regarding medicine use. There are medicines that are either safe or unsafe to take during pregnancy.  Exercise only as directed by your health care provider. Experiencing uterine cramps is a good sign to stop exercising.  Continue to eat regular, healthy meals.  Wear a good support bra for breast tenderness.  Do not use hot tubs, steam rooms, or saunas.  Wear your seat belt at all times when driving.  Avoid raw meat, uncooked cheese, cat litter boxes, and soil used by cats. These carry germs that can cause birth defects in the baby.  Take your prenatal vitamins.  Take 1500-2000 mg of calcium daily starting at the 20th week of pregnancy until you deliver your baby.  Try taking a stool softener (if your health care provider approves) if you develop constipation. Eat more high-fiber foods, such as fresh vegetables or fruit and whole grains. Drink plenty of fluids to keep your urine clear or pale yellow.  Take warm sitz baths to soothe any pain or discomfort caused by hemorrhoids. Use hemorrhoid cream if your health care provider approves.  If you develop varicose veins, wear support hose. Elevate your feet for 15 minutes, 3-4 times a day. Limit salt in your diet.  Avoid heavy lifting, wear low heel shoes, and practice good posture.  Rest with your legs elevated if you  have leg cramps or low back pain.  Visit your dentist if you have not gone yet during your pregnancy. Use a soft toothbrush to brush your teeth and be gentle when you floss.  A sexual relationship may be continued unless your health care provider directs you otherwise.  Continue to go to all your prenatal visits as directed by your health care provider. SEEK MEDICAL CARE IF:   You have dizziness.  You have mild pelvic cramps, pelvic pressure, or nagging pain in the abdominal area.  You have persistent nausea, vomiting, or diarrhea.  You have a bad smelling vaginal discharge.  You have pain with urination. SEEK IMMEDIATE MEDICAL CARE IF:   You have a fever.  You are leaking fluid from your vagina.  You have spotting or bleeding from your vagina.  You have severe abdominal cramping or pain.  You have rapid weight gain or loss.  You have shortness of breath with chest pain.  You notice sudden or extreme swelling of your face, hands, ankles, feet, or legs.  You have not felt your baby move in over an hour.  You have severe headaches that do not go away with medicine.  You have vision changes.   This information is not intended to replace advice given to you by your health care provider. Make sure you discuss any questions you have with your health care provider.   Document Released: 11/05/2001 Document Revised: 12/02/2014 Document Reviewed: 01/12/2013 [employer] Interactive Patient Education 2016 [employer]   Sciatica With Rehab The sciatic nerve runs from the back down the leg and is responsible for sensation and control of the muscles in the back (posterior) side of the thigh, lower leg, and foot. Sciatica is a condition that is characterized by inflammation of this nerve.  SYMPTOMS   Signs of nerve damage, including numbness and/or weakness along the posterior side of the lower extremity.  Pain in the back of the thigh that may also travel down the leg.  Pain  that worsens when sitting for long periods of time.  Occasionally, pain in the back or buttock. CAUSES  Inflammation of the sciatic nerve is the cause of sciatica. The inflammation is due to something irritating the nerve. Common sources of irritation include:  Sitting for long periods of time.  Direct trauma to the nerve.  Arthritis of the spine.  Herniated or ruptured disk.  Slipping of the vertebrae (spondylolisthesis).  Pressure from soft tissues, such as muscles or ligament-like tissue (fascia). RISK INCREASES WITH:  Sports that place pressure or stress on the spine (football or weightlifting).  Poor strength and flexibility.  Failure to warm up properly before activity.  Family history of low back pain or disk disorders.  Previous back injury or surgery.  Poor body mechanics, especially when lifting, or poor posture. PREVENTION   Warm up and stretch properly before activity.  Maintain physical fitness:  Strength, flexibility, and endurance.  Cardiovascular fitness.  Learn and use proper technique, especially with posture and lifting. When possible, have coach correct improper technique.  Avoid activities that place stress on the spine. PROGNOSIS If treated properly, then sciatica usually resolves within 6 weeks. However, occasionally surgery is necessary.  RELATED COMPLICATIONS   Permanent nerve damage, including pain, numbness, tingle, or weakness.  Chronic back pain.  Risks of surgery: infection, bleeding, nerve damage, or damage to surrounding tissues. TREATMENT Treatment initially involves resting from any activities that aggravate your symptoms. The use of ice and medication may help reduce pain and inflammation. The use of strengthening and stretching exercises may help reduce pain with activity. These exercises may be performed at home or with referral to a therapist. A therapist may recommend further treatments, such as transcutaneous electronic  nerve stimulation (TENS) or ultrasound. Your caregiver may recommend corticosteroid injections to help reduce inflammation of the sciatic nerve. If symptoms persist despite non-surgical (conservative) treatment, then surgery may be recommended. MEDICATION  If pain medication is necessary, then nonsteroidal anti-inflammatory medications, such as aspirin and ibuprofen, or other minor pain relievers, such as acetaminophen, are often recommended.  Do not take pain medication for 7 days before surgery.  Prescription pain relievers may be given if deemed necessary by your caregiver. Use only as directed and only as much as you need.  Ointments applied to the skin may be helpful.  Corticosteroid injections may be given by your caregiver. These injections should be reserved for the most serious cases, because they may only be given a certain number of times. HEAT AND COLD  Cold treatment (icing) relieves  pain and reduces inflammation. Cold treatment should be applied for 10 to 15 minutes every 2 to 3 hours for inflammation and pain and immediately after any activity that aggravates your symptoms. Use ice packs or massage the area with a piece of ice (ice massage).  Heat treatment may be used prior to performing the stretching and strengthening activities prescribed by your caregiver, physical therapist, or athletic trainer. Use a heat pack or soak the injury in warm water. SEEK MEDICAL CARE IF:  Treatment seems to offer no benefit, or the condition worsens.  Any medications produce adverse side effects. EXERCISES  RANGE OF MOTION (ROM) AND STRETCHING EXERCISES - Sciatica Most people with sciatic will find that their symptoms worsen with either excessive bending forward (flexion) or arching at the low back (extension). The exercises which will help resolve your symptoms will focus on the opposite motion. Your physician, physical therapist or athletic trainer will help you determine which exercises  will be most helpful to resolve your low back pain. Do not complete any exercises without first consulting with your clinician. Discontinue any exercises which worsen your symptoms until you speak to your clinician. If you have pain, numbness or tingling which travels down into your buttocks, leg or foot, the goal of the therapy is for these symptoms to move closer to your back and eventually resolve. Occasionally, these leg symptoms will get better, but your low back pain may worsen; this is typically an indication of progress in your rehabilitation. Be certain to be very alert to any changes in your symptoms and the activities in which you participated in the 24 hours prior to the change. Sharing this information with your clinician will allow him/her to most efficiently treat your condition. These exercises may help you when beginning to rehabilitate your injury. Your symptoms may resolve with or without further involvement from your physician, physical therapist or athletic trainer. While completing these exercises, remember:   Restoring tissue flexibility helps normal motion to return to the joints. This allows healthier, less painful movement and activity.  An effective stretch should be held for at least 30 seconds.  A stretch should never be painful. You should only feel a gentle lengthening or release in the stretched tissue. FLEXION RANGE OF MOTION AND STRETCHING EXERCISES: STRETCH - Flexion, Single Knee to Chest   Lie on a firm bed or floor with both legs extended in front of you.  Keeping one leg in contact with the floor, bring your opposite knee to your chest. Hold your leg in place by either grabbing behind your thigh or at your knee.  Pull until you feel a gentle stretch in your low back. Hold __________ seconds.  Slowly release your grasp and repeat the exercise with the opposite side. Repeat __________ times. Complete this exercise __________ times per day.  STRETCH - Flexion,  Double Knee to Chest  Lie on a firm bed or floor with both legs extended in front of you.  Keeping one leg in contact with the floor, bring your opposite knee to your chest.  Tense your stomach muscles to support your back and then lift your other knee to your chest. Hold your legs in place by either grabbing behind your thighs or at your knees.  Pull both knees toward your chest until you feel a gentle stretch in your low back. Hold __________ seconds.  Tense your stomach muscles and slowly return one leg at a time to the floor. Repeat __________ times. Complete this exercise  __________ times per day.  STRETCH - Low Trunk Rotation   Lie on a firm bed or floor. Keeping your legs in front of you, bend your knees so they are both pointed toward the ceiling and your feet are flat on the floor.  Extend your arms out to the side. This will stabilize your upper body by keeping your shoulders in contact with the floor.  Gently and slowly drop both knees together to one side until you feel a gentle stretch in your low back. Hold for __________ seconds.  Tense your stomach muscles to support your low back as you bring your knees back to the starting position. Repeat the exercise to the other side. Repeat __________ times. Complete this exercise __________ times per day  EXTENSION RANGE OF MOTION AND FLEXIBILITY EXERCISES: STRETCH - Extension, Prone on Elbows  Lie on your stomach on the floor, a bed will be too soft. Place your palms about shoulder width apart and at the height of your head.  Place your elbows under your shoulders. If this is too painful, stack pillows under your chest.  Allow your body to relax so that your hips drop lower and make contact more completely with the floor.  Hold this position for __________ seconds.  Slowly return to lying flat on the floor. Repeat __________ times. Complete this exercise __________ times per day.  RANGE OF MOTION - Extension, Prone Press  Ups  Lie on your stomach on the floor, a bed will be too soft. Place your palms about shoulder width apart and at the height of your head.  Keeping your back as relaxed as possible, slowly straighten your elbows while keeping your hips on the floor. You may adjust the placement of your hands to maximize your comfort. As you gain motion, your hands will come more underneath your shoulders.  Hold this position __________ seconds.  Slowly return to lying flat on the floor. Repeat __________ times. Complete this exercise __________ times per day.  STRENGTHENING EXERCISES - Sciatica  These exercises may help you when beginning to rehabilitate your injury. These exercises should be done near your "sweet spot." This is the neutral, low-back arch, somewhere between fully rounded and fully arched, that is your least painful position. When performed in this safe range of motion, these exercises can be used for people who have either a flexion or extension based injury. These exercises may resolve your symptoms with or without further involvement from your physician, physical therapist or athletic trainer. While completing these exercises, remember:   Muscles can gain both the endurance and the strength needed for everyday activities through controlled exercises.  Complete these exercises as instructed by your physician, physical therapist or athletic trainer. Progress with the resistance and repetition exercises only as your caregiver advises.  You may experience muscle soreness or fatigue, but the pain or discomfort you are trying to eliminate should never worsen during these exercises. If this pain does worsen, stop and make certain you are following the directions exactly. If the pain is still present after adjustments, discontinue the exercise until you can discuss the trouble with your clinician. STRENGTHENING - Deep Abdominals, Pelvic Tilt   Lie on a firm bed or floor. Keeping your legs in front of  you, bend your knees so they are both pointed toward the ceiling and your feet are flat on the floor.  Tense your lower abdominal muscles to press your low back into the floor. This motion will rotate your pelvis so  that your tail bone is scooping upwards rather than pointing at your feet or into the floor.  With a gentle tension and even breathing, hold this position for __________ seconds. Repeat __________ times. Complete this exercise __________ times per day.  STRENGTHENING - Abdominals, Crunches   Lie on a firm bed or floor. Keeping your legs in front of you, bend your knees so they are both pointed toward the ceiling and your feet are flat on the floor. Cross your arms over your chest.  Slightly tip your chin down without bending your neck.  Tense your abdominals and slowly lift your trunk high enough to just clear your shoulder blades. Lifting higher can put excessive stress on the low back and does not further strengthen your abdominal muscles.  Control your return to the starting position. Repeat __________ times. Complete this exercise __________ times per day.  STRENGTHENING - Quadruped, Opposite UE/LE Lift  Assume a hands and knees position on a firm surface. Keep your hands under your shoulders and your knees under your hips. You may place padding under your knees for comfort.  Find your neutral spine and gently tense your abdominal muscles so that you can maintain this position. Your shoulders and hips should form a rectangle that is parallel with the floor and is not twisted.  Keeping your trunk steady, lift your right hand no higher than your shoulder and then your left leg no higher than your hip. Make sure you are not holding your breath. Hold this position __________ seconds.  Continuing to keep your abdominal muscles tense and your back steady, slowly return to your starting position. Repeat with the opposite arm and leg. Repeat __________ times. Complete this exercise  __________ times per day.  STRENGTHENING - Abdominals and Quadriceps, Straight Leg Raise   Lie on a firm bed or floor with both legs extended in front of you.  Keeping one leg in contact with the floor, bend the other knee so that your foot can rest flat on the floor.  Find your neutral spine, and tense your abdominal muscles to maintain your spinal position throughout the exercise.  Slowly lift your straight leg off the floor about 6 inches for a count of 15, making sure to not hold your breath.  Still keeping your neutral spine, slowly lower your leg all the way to the floor. Repeat this exercise with each leg __________ times. Complete this exercise __________ times per day. POSTURE AND BODY MECHANICS CONSIDERATIONS - Sciatica Keeping correct posture when sitting, standing or completing your activities will reduce the stress put on different body tissues, allowing injured tissues a chance to heal and limiting painful experiences. The following are general guidelines for improved posture. Your physician or physical therapist will provide you with any instructions specific to your needs. While reading these guidelines, remember:  The exercises prescribed by your provider will help you have the flexibility and strength to maintain correct postures.  The correct posture provides the optimal environment for your joints to work. All of your joints have less wear and tear when properly supported by a spine with good posture. This means you will experience a healthier, less painful body.  Correct posture must be practiced with all of your activities, especially prolonged sitting and standing. Correct posture is as important when doing repetitive low-stress activities (typing) as it is when doing a single heavy-load activity (lifting). RESTING POSITIONS Consider which positions are most painful for you when choosing a resting position. If you have  pain with flexion-based activities (sitting, bending,  stooping, squatting), choose a position that allows you to rest in a less flexed posture. You would want to avoid curling into a fetal position on your side. If your pain worsens with extension-based activities (prolonged standing, working overhead), avoid resting in an extended position such as sleeping on your stomach. Most people will find more comfort when they rest with their spine in a more neutral position, neither too rounded nor too arched. Lying on a non-sagging bed on your side with a pillow between your knees, or on your back with a pillow under your knees will often provide some relief. Keep in mind, being in any one position for a prolonged period of time, no matter how correct your posture, can still lead to stiffness. PROPER SITTING POSTURE In order to minimize stress and discomfort on your spine, you must sit with correct posture Sitting with good posture should be effortless for a healthy body. Returning to good posture is a gradual process. Many people can work toward this most comfortably by using various supports until they have the flexibility and strength to maintain this posture on their own. When sitting with proper posture, your ears will fall over your shoulders and your shoulders will fall over your hips. You should use the back of the chair to support your upper back. Your low back will be in a neutral position, just slightly arched. You may place a small pillow or folded towel at the base of your low back for support.  When working at a desk, create an environment that supports good, upright posture. Without extra support, muscles fatigue and lead to excessive strain on joints and other tissues. Keep these recommendations in mind: CHAIR:   A chair should be able to slide under your desk when your back makes contact with the back of the chair. This allows you to work closely.  The chair's height should allow your eyes to be level with the upper part of your monitor and your  hands to be slightly lower than your elbows. BODY POSITION  Your feet should make contact with the floor. If this is not possible, use a foot rest.  Keep your ears over your shoulders. This will reduce stress on your neck and low back. INCORRECT SITTING POSTURES   If you are feeling tired and unable to assume a healthy sitting posture, do not slouch or slump. This puts excessive strain on your back tissues, causing more damage and pain. Healthier options include:  Using more support, like a lumbar pillow.  Switching tasks to something that requires you to be upright or walking.  Talking a brief walk.  Lying down to rest in a neutral-spine position. PROLONGED STANDING WHILE SLIGHTLY LEANING FORWARD  When completing a task that requires you to lean forward while standing in one place for a long time, place either foot up on a stationary 2-4 inch high object to help maintain the best posture. When both feet are on the ground, the low back tends to lose its slight inward curve. If this curve flattens (or becomes too large), then the back and your other joints will experience too much stress, fatigue more quickly and can cause pain.  CORRECT STANDING POSTURES Proper standing posture should be assumed with all daily activities, even if they only take a few moments, like when brushing your teeth. As in sitting, your ears should fall over your shoulders and your shoulders should fall over your hips. You should  keep a slight tension in your abdominal muscles to brace your spine. Your tailbone should point down to the ground, not behind your body, resulting in an over-extended swayback posture.  INCORRECT STANDING POSTURES  Common incorrect standing postures include a forward head, locked knees and/or an excessive swayback. WALKING Walk with an upright posture. Your ears, shoulders and hips should all line-up. PROLONGED ACTIVITY IN A FLEXED POSITION When completing a task that requires you to bend  forward at your waist or lean over a low surface, try to find a way to stabilize 3 of 4 of your limbs. You can place a hand or elbow on your thigh or rest a knee on the surface you are reaching across. This will provide you more stability so that your muscles do not fatigue as quickly. By keeping your knees relaxed, or slightly bent, you will also reduce stress across your low back. CORRECT LIFTING TECHNIQUES DO :   Assume a wide stance. This will provide you more stability and the opportunity to get as close as possible to the object which you are lifting.  Tense your abdominals to brace your spine; then bend at the knees and hips. Keeping your back locked in a neutral-spine position, lift using your leg muscles. Lift with your legs, keeping your back straight.  Test the weight of unknown objects before attempting to lift them.  Try to keep your elbows locked down at your sides in order get the best strength from your shoulders when carrying an object.  Always ask for help when lifting heavy or awkward objects. INCORRECT LIFTING TECHNIQUES DO NOT:   Lock your knees when lifting, even if it is a small object.  Bend and twist. Pivot at your feet or move your feet when needing to change directions.  Assume that you cannot safely pick up a paperclip without proper posture.   This information is not intended to replace advice given to you by your health care provider. Make sure you discuss any questions you have with your health care provider.   Document Released: 11/11/2005 Document Revised: 03/28/2015 Document Reviewed: 02/23/2009 Elsevier Interactive Patient Education [employer]

## 2016-09-07 LAB — GC/CHLAMYDIA PROBE AMP
CHLAMYDIA, DNA PROBE: NEGATIVE
NEISSERIA GONORRHOEAE BY PCR: NEGATIVE

## 2016-09-12 ENCOUNTER — Encounter: Payer: Self-pay | Admitting: Women's Health

## 2016-09-12 ENCOUNTER — Telehealth: Payer: Self-pay | Admitting: *Deleted

## 2016-09-12 NOTE — Telephone Encounter (Signed)
Patient called with questions regarding "reading material" in regards to 1500-2000mg  of calcium daily. Patient states she takes a PNV and drinks lots of milk. Message to be sent to K. Grace BushyBooker, CNM but patient stated she would just wait until next appointment to discuss with her. Also asked about getting the flu shot. Advised patient that providers are encouraging patients to get it and getting at her next appointment would be fine. Patient signed up for mychart. No further questions.

## 2016-09-17 ENCOUNTER — Encounter: Payer: Self-pay | Admitting: Women's Health

## 2016-10-02 ENCOUNTER — Encounter: Payer: Self-pay | Admitting: Women's Health

## 2016-10-03 ENCOUNTER — Ambulatory Visit (INDEPENDENT_AMBULATORY_CARE_PROVIDER_SITE_OTHER): Payer: Medicaid Other

## 2016-10-03 ENCOUNTER — Ambulatory Visit (INDEPENDENT_AMBULATORY_CARE_PROVIDER_SITE_OTHER): Payer: Medicaid Other | Admitting: Women's Health

## 2016-10-03 ENCOUNTER — Encounter: Payer: Self-pay | Admitting: Women's Health

## 2016-10-03 VITALS — BP 110/60 | HR 80 | Wt 224.0 lb

## 2016-10-03 DIAGNOSIS — Z1389 Encounter for screening for other disorder: Secondary | ICD-10-CM

## 2016-10-03 DIAGNOSIS — Z331 Pregnant state, incidental: Secondary | ICD-10-CM

## 2016-10-03 DIAGNOSIS — O0992 Supervision of high risk pregnancy, unspecified, second trimester: Secondary | ICD-10-CM | POA: Diagnosis not present

## 2016-10-03 DIAGNOSIS — Z3A24 24 weeks gestation of pregnancy: Secondary | ICD-10-CM | POA: Diagnosis not present

## 2016-10-03 DIAGNOSIS — O09292 Supervision of pregnancy with other poor reproductive or obstetric history, second trimester: Secondary | ICD-10-CM | POA: Diagnosis not present

## 2016-10-03 DIAGNOSIS — O0990 Supervision of high risk pregnancy, unspecified, unspecified trimester: Secondary | ICD-10-CM

## 2016-10-03 LAB — POCT URINALYSIS DIPSTICK
Blood, UA: NEGATIVE
Glucose, UA: NEGATIVE
KETONES UA: NEGATIVE
LEUKOCYTES UA: NEGATIVE
NITRITE UA: NEGATIVE
PROTEIN UA: NEGATIVE

## 2016-10-03 NOTE — Progress Notes (Signed)
High Risk Pregnancy Diagnosis(es): h/o IUFD x 2 [id number] 3833w3d Estimated Date of Delivery: 01/27/17 BP 110/60   Pulse 80   Wt 224 lb (101.6 kg)   LMP  (LMP Unknown)   BMI 39.68 kg/m   Urinalysis: Negative HPI:  Doing well BP, weight, and urine reviewed.  Reports good fm. Denies regular uc's, lof, vb, uti s/s. No complaints.  Fundal Height:  24 Fetal Heart rate:  153 u/s Edema:  none  Reviewed today's u/s: efw 43%/svp 4.8cm, cx 4.3cm All questions were answered Assessment: 3233w3d h/o IUFD x 2 Medication(s) Plans:  Baby asa  Treatment Plan:  U/S @  28, 32, 36wks    BPP weekly @ 28wks then 2x/wk testing @ 32wks    Deliver @ 39wks Follow up in 4wks for high-risk OB appt, pn2, and efw/afi u/s

## 2016-10-03 NOTE — Patient Instructions (Signed)
You will have your sugar test next visit.  Please do not eat or drink anything after midnight the night before you come, not even water.  You will be here for at least two hours.     Call the office ([phone]) or go to [facility] if:  You begin to have strong, frequent contractions  Your water breaks.  Sometimes it is a big gush of fluid, sometimes it is just a trickle that keeps getting your panties wet or running down your legs  You have vaginal bleeding.  It is normal to have a small amount of spotting if your cervix was checked.   You don't feel your baby moving like normal.  If you don't, get you something to eat and drink and lay down and focus on feeling your baby move.   If your baby is still not moving like normal, you should call the office or go to [facility].  Second Trimester of Pregnancy The second trimester is from week 13 through week 28, months 4 through 6. The second trimester is often a time when you feel your best. Your body has also adjusted to being pregnant, and you begin to feel better physically. Usually, morning sickness has lessened or quit completely, you may have more energy, and you may have an increase in appetite. The second trimester is also a time when the fetus is growing rapidly. At the end of the sixth month, the fetus is about 9 inches long and weighs about 1 pounds. You will likely begin to feel the baby move (quickening) between 18 and 20 weeks of the pregnancy. BODY CHANGES Your body goes through many changes during pregnancy. The changes vary from woman to woman.   Your weight will continue to increase. You will notice your lower abdomen bulging out.  You may begin to get stretch marks on your hips, abdomen, and breasts.  You may develop headaches that can be relieved by medicines approved by your health care provider.  You may urinate more often because the fetus is pressing on your bladder.  You may develop or continue to have  heartburn as a result of your pregnancy.  You may develop constipation because certain hormones are causing the muscles that push waste through your intestines to slow down.  You may develop hemorrhoids or swollen, bulging veins (varicose veins).  You may have back pain because of the weight gain and pregnancy hormones relaxing your joints between the bones in your pelvis and as a result of a shift in weight and the muscles that support your balance.  Your breasts will continue to grow and be tender.  Your gums may bleed and may be sensitive to brushing and flossing.  Dark spots or blotches (chloasma, mask of pregnancy) may develop on your face. This will likely fade after the baby is born.  A dark line from your belly button to the pubic area (linea nigra) may appear. This will likely fade after the baby is born.  You may have changes in your hair. These can include thickening of your hair, rapid growth, and changes in texture. Some women also have hair loss during or after pregnancy, or hair that feels dry or thin. Your hair will most likely return to normal after your baby is born. WHAT TO EXPECT AT YOUR PRENATAL VISITS During a routine prenatal visit:  You will be weighed to make sure you and the fetus are growing normally.  Your blood pressure will be taken.    Your abdomen will be measured to track your baby's growth.  The fetal heartbeat will be listened to.  Any test results from the previous visit will be discussed. Your health care provider may ask you:  How you are feeling.  If you are feeling the baby move.  If you have had any abnormal symptoms, such as leaking fluid, bleeding, severe headaches, or abdominal cramping.  If you have any questions. Other tests that may be performed during your second trimester include:  Blood tests that check for:  Low iron levels (anemia).  Gestational diabetes (between 24 and 28 weeks).  Rh antibodies.  Urine tests to check  for infections, diabetes, or protein in the urine.  An ultrasound to confirm the proper growth and development of the baby.  An amniocentesis to check for possible genetic problems.  Fetal screens for spina bifida and Down syndrome. HOME CARE INSTRUCTIONS   Avoid all smoking, herbs, alcohol, and unprescribed drugs. These chemicals affect the formation and growth of the baby.  Follow your health care provider's instructions regarding medicine use. There are medicines that are either safe or unsafe to take during pregnancy.  Exercise only as directed by your health care provider. Experiencing uterine cramps is a good sign to stop exercising.  Continue to eat regular, healthy meals.  Wear a good support bra for breast tenderness.  Do not use hot tubs, steam rooms, or saunas.  Wear your seat belt at all times when driving.  Avoid raw meat, uncooked cheese, cat litter boxes, and soil used by cats. These carry germs that can cause birth defects in the baby.  Take your prenatal vitamins.  Try taking a stool softener (if your health care provider approves) if you develop constipation. Eat more high-fiber foods, such as fresh vegetables or fruit and whole grains. Drink plenty of fluids to keep your urine clear or pale yellow.  Take warm sitz baths to soothe any pain or discomfort caused by hemorrhoids. Use hemorrhoid cream if your health care provider approves.  If you develop varicose veins, wear support hose. Elevate your feet for 15 minutes, 3-4 times a day. Limit salt in your diet.  Avoid heavy lifting, wear low heel shoes, and practice good posture.  Rest with your legs elevated if you have leg cramps or low back pain.  Visit your dentist if you have not gone yet during your pregnancy. Use a soft toothbrush to brush your teeth and be gentle when you floss.  A sexual relationship may be continued unless your health care provider directs you otherwise.  Continue to go to all your  prenatal visits as directed by your health care provider. SEEK MEDICAL CARE IF:   You have dizziness.  You have mild pelvic cramps, pelvic pressure, or nagging pain in the abdominal area.  You have persistent nausea, vomiting, or diarrhea.  You have a bad smelling vaginal discharge.  You have pain with urination. SEEK IMMEDIATE MEDICAL CARE IF:   You have a fever.  You are leaking fluid from your vagina.  You have spotting or bleeding from your vagina.  You have severe abdominal cramping or pain.  You have rapid weight gain or loss.  You have shortness of breath with chest pain.  You notice sudden or extreme swelling of your face, hands, ankles, feet, or legs.  You have not felt your baby move in over an hour.  You have severe headaches that do not go away with medicine.  You have vision changes.   Document Released: 11/05/2001 Document Revised: 11/16/2013 Document Reviewed: 01/12/2013 ExitCare Patient Information 2015 [employer]. This information is not intended to replace advice given to you by your health care provider. Make sure you discuss any questions you have with your health care provider.     

## 2016-10-03 NOTE — Progress Notes (Signed)
US 23+3 wks,cephalic,cx 4.3 cm,normal ov's bilat,post pl gr 0,fhr 153 bpm,svp of fluid 4.8 cm,efw 611 g 47%

## 2016-10-31 ENCOUNTER — Encounter: Payer: Self-pay | Admitting: Orthopaedic Surgery

## 2016-10-31 ENCOUNTER — Ambulatory Visit (INDEPENDENT_AMBULATORY_CARE_PROVIDER_SITE_OTHER): Payer: Medicaid Other | Admitting: Orthopaedic Surgery

## 2016-10-31 VITALS — BP 116/75 | HR 86 | Temp 97.9°F | Ht 64.0 in | Wt 230.0 lb

## 2016-10-31 DIAGNOSIS — G5600 Carpal tunnel syndrome, unspecified upper limb: Secondary | ICD-10-CM

## 2016-10-31 DIAGNOSIS — O26899 Other specified pregnancy related conditions, unspecified trimester: Secondary | ICD-10-CM

## 2016-10-31 NOTE — Progress Notes (Signed)
Patient ZO:XWRUEAVWU:Jade Taylor, female DOB:01/19/1991, 25 y.o. JWJ:[id number]  Chief Complaint  Patient presents with  . Follow-up    carpal tunnel    HPI  Jade Taylor is a 25 y.o. female who is pregnant and has carpal tunnel syndrome bilaterally.  She is doing well.  She understands about splinting.  She has no trauma.  She has had two stillborns and hopefully this pregnancy will be successful. HPI  Body mass index is 39.48 kg/m.  ROS  Review of Systems  Constitutional:       Patient does not have Diabetes Mellitus. Patient does not have hypertension. Patient does not have COPD or shortness of breath. Patient has BMI > 35. Patient has current smoking history.  HENT: Negative for congestion.   Respiratory: Negative for cough and shortness of breath.   Endocrine: Positive for cold intolerance.  Musculoskeletal: Positive for arthralgias, myalgias and neck pain.  Allergic/Immunologic: Positive for environmental allergies.    Past Medical History:  Diagnosis Date  . Asthma   . 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 Hilda LiasKeeling  . Chronic pain syndrome   . PTSD (post-traumatic stress disorder)     Past Surgical History:  Procedure Laterality Date  . CARPAL TUNNEL RELEASE    . WISDOM TOOTH EXTRACTION      Family History  Problem Relation Age of Onset  . Bipolar disorder Mother   . Schizophrenia Mother   . Stroke Father   . Heart disease Maternal Grandmother   . Heart disease Paternal Grandfather     Social History Social History  Substance Use Topics  . Smoking status: Former Smoker    Packs/day: 0.50    Years: 0.50    Types: Cigarettes    Quit date: 07/02/2016  . Smokeless tobacco: Never Used  . Alcohol use No     Comment: ocassilly    Allergies  Allergen Reactions  . Metronidazole Hives, Shortness Of Breath, Rash and Other (See Comments)    Burning sensation  . Penicillins Shortness Of Breath, Itching and Rash  . Amoxicillin      Current Outpatient Prescriptions  Medication Sig Dispense Refill  . aspirin EC 81 MG tablet Take 81 mg by mouth daily.    Marland Kitchen. HYDROcodone-acetaminophen (NORCO/VICODIN) 5-325 MG tablet Take 1 tablet by mouth every 4 (four) hours as needed for moderate pain (Must last 30 days.  Do not take and drive a car or use machinery.). (Patient not taking: Reported on 10/03/2016) 120 tablet 0  . Prenatal MV-Min-Fe Fum-FA-DHA (PRENATAL 1 PO) Take by mouth.     No current facility-administered medications for this visit.      Physical Exam  Blood pressure 116/75, pulse 86, temperature 97.9 F (36.6 C), height 5\' 4"  (1.626 m), weight 230 lb (104.3 kg).  Constitutional: overall normal hygiene, normal nutrition, well developed, normal grooming, normal body habitus. Assistive device:none  Musculoskeletal: gait and station Limp none, muscle tone and strength are normal, no tremors or atrophy is present.  .  Neurological: coordination overall normal.  Deep tendon reflex/nerve stretch intact.  Sensation normal.  Cranial nerves II-XII intact.   Skin:   Normal overall no scars, lesions, ulcers or rashes. No psoriasis.  Psychiatric: Alert and oriented x 3.  Recent memory intact, remote memory unclear.  Normal mood and affect. Well groomed.  Good eye contact.  Cardiovascular: overall no swelling, no varicosities, no edema bilaterally, normal temperatures of the legs and arms, no clubbing, cyanosis and good  capillary refill.  Lymphatic: palpation is normal.  She has bilateral Tinel and Phalen signs of the wrists.  ROM is full.  The patient has been educated about the nature of the problem(s) and counseled on treatment options.  The patient appeared to understand what I have discussed and is in agreement with it.  Encounter Diagnosis  Name Primary?  . Carpal tunnel syndrome during pregnancy Yes    PLAN Call if any problems.  Precautions discussed.  Continue current medications.   Return to clinic  PRN   .wxk

## 2016-11-04 ENCOUNTER — Ambulatory Visit (INDEPENDENT_AMBULATORY_CARE_PROVIDER_SITE_OTHER): Payer: Medicaid Other

## 2016-11-04 ENCOUNTER — Encounter: Payer: Self-pay | Admitting: Women's Health

## 2016-11-04 ENCOUNTER — Other Ambulatory Visit: Payer: Medicaid Other

## 2016-11-04 ENCOUNTER — Other Ambulatory Visit: Payer: Self-pay | Admitting: Women's Health

## 2016-11-04 ENCOUNTER — Ambulatory Visit (INDEPENDENT_AMBULATORY_CARE_PROVIDER_SITE_OTHER): Payer: Medicaid Other | Admitting: Women's Health

## 2016-11-04 VITALS — BP 110/60 | HR 80 | Wt 228.0 lb

## 2016-11-04 DIAGNOSIS — Z331 Pregnant state, incidental: Secondary | ICD-10-CM | POA: Diagnosis not present

## 2016-11-04 DIAGNOSIS — O0993 Supervision of high risk pregnancy, unspecified, third trimester: Secondary | ICD-10-CM | POA: Diagnosis not present

## 2016-11-04 DIAGNOSIS — O09293 Supervision of pregnancy with other poor reproductive or obstetric history, third trimester: Secondary | ICD-10-CM

## 2016-11-04 DIAGNOSIS — Z3A28 28 weeks gestation of pregnancy: Secondary | ICD-10-CM

## 2016-11-04 DIAGNOSIS — O0990 Supervision of high risk pregnancy, unspecified, unspecified trimester: Secondary | ICD-10-CM

## 2016-11-04 DIAGNOSIS — Z1389 Encounter for screening for other disorder: Secondary | ICD-10-CM

## 2016-11-04 DIAGNOSIS — O09292 Supervision of pregnancy with other poor reproductive or obstetric history, second trimester: Secondary | ICD-10-CM

## 2016-11-04 DIAGNOSIS — Z3483 Encounter for supervision of other normal pregnancy, third trimester: Secondary | ICD-10-CM

## 2016-11-04 DIAGNOSIS — Z131 Encounter for screening for diabetes mellitus: Secondary | ICD-10-CM

## 2016-11-04 LAB — POCT URINALYSIS DIPSTICK
Glucose, UA: NEGATIVE
KETONES UA: NEGATIVE
Leukocytes, UA: NEGATIVE
Nitrite, UA: NEGATIVE
PROTEIN UA: NEGATIVE
RBC UA: NEGATIVE

## 2016-11-04 NOTE — Patient Instructions (Addendum)
Call the office [phone]) or [facility]:  You begin to have strong, frequent contractions  Your water breaks.  Sometimes it is a big gush of fluid, sometimes it is just a trickle that keeps getting your panties wet or running down your legs  You have vaginal bleeding.  It is normal to have a small amount of spotting if your cervix was checked.   You don't feel your baby moving like normal.  If you don't, get you something to eat and drink and lay down and focus on feeling your baby move.  You should feel at least 10 movements in 2 hours.  If you don't, you should call the office or [facility]    Tdap Vaccine  It is recommended that you get the Tdap vaccine during the third trimester of EACH pregnancy to help protect your baby from getting pertussis (whooping cough)  27-36 weeks is the BEST time to do this so that you can pass the protection on to your baby. During pregnancy is better than after pregnancy, but if you are unable to get it during pregnancy it will be offered at the hospital.   You can get this vaccine at the health department or your family doctor  Everyone who will be around your baby should also be up-to-date on their vaccines. Adults (who are not pregnant) only need 1 dose of Tdap during adulthood.   For your lower back pain you may:  Purchase a pregnancy belt from [employer], [employer], [employer], etc and wear it while you are up and about  Take warm baths  Use a heating pad to your lower back for no longer than 20 minutes at a time, and do not place near abdomen  Take tylenol as needed. Please follow directions on the bottle    Third Trimester of Pregnancy The third trimester is from week 29 through week 42, months 7 through 9. The third trimester is a time when the fetus is growing rapidly. At the end of the ninth month, the fetus is about 20 inches in length and weighs 6-10 pounds.  BODY CHANGES Your body goes through many  changes during pregnancy. The changes vary from woman to woman.   Your weight will continue to increase. You can expect to gain 25-35 pounds (11-16 kg) by the end of the pregnancy.  You may begin to get stretch marks on your hips, abdomen, and breasts.  You may urinate more often because the fetus is moving lower into your pelvis and pressing on your bladder.  You may develop or continue to have heartburn as a result of your pregnancy.  You may develop constipation because certain hormones are causing the muscles that push waste through your intestines to slow down.  You may develop hemorrhoids or swollen, bulging veins (varicose veins).  You may have pelvic pain because of the weight gain and pregnancy hormones relaxing your joints between the bones in your pelvis. Backaches may result from overexertion of the muscles supporting your posture.  You may have changes in your hair. These can include thickening of your hair, rapid growth, and changes in texture. Some women also have hair loss during or after pregnancy, or hair that feels dry or thin. Your hair will most likely return to normal after your baby is born.  Your breasts will continue to grow and be tender. A yellow discharge may leak from your breasts called colostrum.  Your belly button may  stick out.  You may feel short of breath because of your expanding uterus.  You may notice the fetus "dropping," or moving lower in your abdomen.  You may have a bloody mucus discharge. This usually occurs a few days to a week before labor begins.  Your cervix becomes thin and soft (effaced) near your due date. WHAT TO EXPECT AT YOUR PRENATAL EXAMS  You will have prenatal exams every 2 weeks until week 36. Then, you will have weekly prenatal exams. During a routine prenatal visit:  You will be weighed to make sure you and the fetus are growing normally.  Your blood pressure is taken.  Your abdomen will be measured to track your baby's  growth.  The fetal heartbeat will be listened to.  Any test results from the previous visit will be discussed.  You may have a cervical check near your due date to see if you have effaced. At around 36 weeks, your caregiver will check your cervix. At the same time, your caregiver will also perform a test on the secretions of the vaginal tissue. This test is to determine if a type of bacteria, Group B streptococcus, is present. Your caregiver will explain this further. Your caregiver may ask you:  What your birth plan is.  How you are feeling.  If you are feeling the baby move.  If you have had any abnormal symptoms, such as leaking fluid, bleeding, severe headaches, or abdominal cramping.  If you have any questions. Other tests or screenings that may be performed during your third trimester include:  Blood tests that check for low iron levels (anemia).  Fetal testing to check the health, activity level, and growth of the fetus. Testing is done if you have certain medical conditions or if there are problems during the pregnancy. FALSE LABOR You may feel small, irregular contractions that eventually go away. These are called Braxton Hicks contractions, or false labor. Contractions may last for hours, days, or even weeks before true labor sets in. If contractions come at regular intervals, intensify, or become painful, it is best to be seen by your caregiver.  SIGNS OF LABOR   Menstrual-like cramps.  Contractions that are 5 minutes apart or less.  Contractions that start on the top of the uterus and spread down to the lower abdomen and back.  A sense of increased pelvic pressure or back pain.  A watery or bloody mucus discharge that comes from the vagina. If you have any of these signs before the 37th week of pregnancy, call your caregiver right away. You need to go to the hospital to get checked immediately. HOME CARE INSTRUCTIONS   Avoid all smoking, herbs, alcohol, and  unprescribed drugs. These chemicals affect the formation and growth of the baby.  Follow your caregiver's instructions regarding medicine use. There are medicines that are either safe or unsafe to take during pregnancy.  Exercise only as directed by your caregiver. Experiencing uterine cramps is a good sign to stop exercising.  Continue to eat regular, healthy meals.  Wear a good support bra for breast tenderness.  Do not use hot tubs, steam rooms, or saunas.  Wear your seat belt at all times when driving.  Avoid raw meat, uncooked cheese, cat litter boxes, and soil used by cats. These carry germs that can cause birth defects in the baby.  Take your prenatal vitamins.  Try taking a stool softener (if your caregiver approves) if you develop constipation. Eat more high-fiber foods, such as  fruit and whole grains. Drink plenty of fluids to keep your urine clear or pale yellow.  Take warm sitz baths to soothe any pain or discomfort caused by hemorrhoids. Use hemorrhoid cream if your caregiver approves.  If you develop varicose veins, wear support hose. Elevate your feet for 15 minutes, 3-4 times a day. Limit salt in your diet.  Avoid heavy lifting, wear low heal shoes, and practice good posture.  Rest a lot with your legs elevated if you have leg cramps or low back pain.  Visit your dentist if you have not gone during your pregnancy. Use a soft toothbrush to brush your teeth and be gentle when you floss.  A sexual relationship may be continued unless your caregiver directs you otherwise.  Do not travel far distances unless it is absolutely necessary and only with the approval of your caregiver.  Take prenatal classes to understand, practice, and ask questions about the labor and delivery.  Make a trial run to the hospital.  Pack your hospital bag.  Prepare the baby's nursery.  Continue to go to all your prenatal visits as directed by your caregiver. SEEK  MEDICAL CARE IF:  You are unsure if you are in labor or if your water has broken.  You have dizziness.  You have mild pelvic cramps, pelvic pressure, or nagging pain in your abdominal area.  You have persistent nausea, vomiting, or diarrhea.  You have a bad smelling vaginal discharge.  You have pain with urination. SEEK IMMEDIATE MEDICAL CARE IF:   You have a fever.  You are leaking fluid from your vagina.  You have spotting or bleeding from your vagina.  You have severe abdominal cramping or pain.  You have rapid weight loss or gain.  You have shortness of breath with chest pain.  You notice sudden or extreme swelling of your face, hands, ankles, feet, or legs.  You have not felt your baby move in over an hour.  You have severe headaches that do not go away with medicine.  You have vision changes. Document Released: 11/05/2001 Document Revised: 11/16/2013 Document Reviewed: 01/12/2013 Exit[employer] Patient Information 2015 [employer]. This information is not intended to replace advice given to you by your health care provider. Make sure you discuss any questions you have with your health care provider.   

## 2016-11-04 NOTE — Addendum Note (Signed)
Addended by: Criss AlvinePULLIAM, Deagan Sevin G on: 11/04/2016 08:59 AM   Modules accepted: Orders

## 2016-11-04 NOTE — Progress Notes (Signed)
US 28 wks,cephalic,cx 3.7 cm,bilat adnexa's wnl,post pl gr 0,fhr 146 bpm,afi 15.4 cm,efw 1212 g 57 %

## 2016-11-04 NOTE — Progress Notes (Signed)
High Risk Pregnancy Diagnosis(es): h/o IUFD x 2 G5P0220 1885w0d Estimated Date of Delivery: 01/27/17 BP 110/60   Pulse 80   Wt 228 lb (103.4 kg)   LMP  (LMP Unknown)   BMI 39.14 kg/m   Urinalysis: Negative HPI:  Doing well, some LBP, and Lt sided abd pain, loose stools BP, weight, and urine reviewed.  Reports good fm. Denies regular uc's, lof, vb, uti s/s. No complaints. Last used THC ~ 2wks ago d/t decreased appetite. Advised complete cessation, discussed potential effects on fetus/developing child.   Fundal Height:  28 Fetal Heart rate:  146 u/s Edema:  none  Reviewed today's normal f/u u/s, ptl s/s, fkc, Recommended Tdap at HD/PCP per CDC guidelines. Discussed LBP relief measures. Increase fluids, GI bug going around, stay hydrated. A All questions were answered Assessment: 5085w0d h/o IUFD x 2 Medication(s) Plans:  Continue baby asa Treatment Plan:  Weekly BPPs, then 2x/wk testing @ 32wks, then IOL @ 39wks Follow up in 1wk for high-risk OB appt and BPP PN2 today

## 2016-11-05 LAB — HIV ANTIBODY (ROUTINE TESTING W REFLEX): HIV SCREEN 4TH GENERATION: NONREACTIVE

## 2016-11-05 LAB — CBC
HEMATOCRIT: 36.6 % (ref 34.0–46.6)
HEMOGLOBIN: 12.5 g/dL (ref 11.1–15.9)
MCH: 30.6 pg (ref 26.6–33.0)
MCHC: 34.2 g/dL (ref 31.5–35.7)
MCV: 90 fL (ref 79–97)
Platelets: 185 10*3/uL (ref 150–379)
RBC: 4.08 x10E6/uL (ref 3.77–5.28)
RDW: 13.4 % (ref 12.3–15.4)
WBC: 10.2 10*3/uL (ref 3.4–10.8)

## 2016-11-05 LAB — GLUCOSE TOLERANCE, 2 HOURS W/ 1HR
GLUCOSE, 1 HOUR: 120 mg/dL (ref 65–179)
Glucose, 2 hour: 92 mg/dL (ref 65–152)
Glucose, Fasting: 88 mg/dL (ref 65–91)

## 2016-11-05 LAB — ANTIBODY SCREEN: ANTIBODY SCREEN: NEGATIVE

## 2016-11-05 LAB — RPR: RPR Ser Ql: NONREACTIVE

## 2016-11-11 ENCOUNTER — Ambulatory Visit (INDEPENDENT_AMBULATORY_CARE_PROVIDER_SITE_OTHER): Payer: Medicaid Other | Admitting: Women's Health

## 2016-11-11 ENCOUNTER — Ambulatory Visit (INDEPENDENT_AMBULATORY_CARE_PROVIDER_SITE_OTHER): Payer: Medicaid Other

## 2016-11-11 VITALS — BP 120/58 | HR 78 | Wt 234.0 lb

## 2016-11-11 DIAGNOSIS — Z331 Pregnant state, incidental: Secondary | ICD-10-CM | POA: Diagnosis not present

## 2016-11-11 DIAGNOSIS — O0990 Supervision of high risk pregnancy, unspecified, unspecified trimester: Secondary | ICD-10-CM

## 2016-11-11 DIAGNOSIS — O09293 Supervision of pregnancy with other poor reproductive or obstetric history, third trimester: Secondary | ICD-10-CM

## 2016-11-11 DIAGNOSIS — Z1389 Encounter for screening for other disorder: Secondary | ICD-10-CM | POA: Diagnosis not present

## 2016-11-11 DIAGNOSIS — O99323 Drug use complicating pregnancy, third trimester: Secondary | ICD-10-CM | POA: Diagnosis not present

## 2016-11-11 DIAGNOSIS — O0993 Supervision of high risk pregnancy, unspecified, third trimester: Secondary | ICD-10-CM

## 2016-11-11 DIAGNOSIS — Z3A29 29 weeks gestation of pregnancy: Secondary | ICD-10-CM

## 2016-11-11 NOTE — Patient Instructions (Addendum)
Call the office [phone]) or [facility] if:  You begin to have strong, frequent contractions  Your water breaks.  Sometimes it is a big gush of fluid, sometimes it is just a trickle that keeps getting your panties wet or running down your legs  You have vaginal bleeding.  It is normal to have a small amount of spotting if your cervix was checked.   You don't feel your baby moving like normal.  If you don't, get you something to eat and drink and lay down and focus on feeling your baby move.  You should feel at least 10 movements in 2 hours.  If you don't, you should call the office or [facility].   For Dizzy Spells:   This is usually related to either your blood sugar or your blood pressure dropping  Make sure you are staying well hydrated and drinking enough water so that your urine is clear  Eat small frequent meals and snacks containing protein (meat, eggs, nuts, cheese) so that your blood sugar doesn't drop  If you do get dizzy, sit/lay down and get you something to drink and a snack containing protein- you will usually start feeling better in 10-20 minutes       Preterm Labor and Birth Information The normal length of a pregnancy is 39-41 weeks. Preterm labor is when labor starts before 37 completed weeks of pregnancy. What are the risk factors for preterm labor? Preterm labor is more likely to occur in women who:  Have certain infections during pregnancy such as a bladder infection, sexually transmitted infection, or infection inside the uterus (chorioamnionitis).  Have a shorter-than-normal cervix.  Have gone into preterm labor before.  Have had surgery on their cervix.  Are younger than age 26 or older than age 26.  Are African American.  Are pregnant with twins or multiple babies (multiple gestation).  Take street drugs or smoke while pregnant.  Do not gain enough weight while pregnant.  Became pregnant shortly after having been  pregnant. What are the symptoms of preterm labor? Symptoms of preterm labor include:  Cramps similar to those that can happen during a menstrual period. The cramps may happen with diarrhea.  Pain in the abdomen or lower back.  Regular uterine contractions that may feel like tightening of the abdomen.  A feeling of increased pressure in the pelvis.  Increased watery or bloody mucus discharge from the vagina.  Water breaking (ruptured amniotic sac). Why is it important to recognize signs of preterm labor? It is important to recognize signs of preterm labor because babies who are born prematurely may not be fully developed. This can put them at an increased risk for:  Long-term (chronic) heart and lung problems.  Difficulty immediately after birth with regulating body systems, including blood sugar, body temperature, heart rate, and breathing rate.  Bleeding in the brain.  Cerebral palsy.  Learning difficulties.  Death. These risks are highest for babies who are born before 34 weeks of pregnancy. How is preterm labor treated? Treatment depends on the length of your pregnancy, your condition, and the health of your baby. It may involve:  Having a stitch (suture) placed in your cervix to prevent your cervix from opening too early (cerclage).  Taking or being given medicines, such as:  Hormone medicines. These may be given early in pregnancy to help support the pregnancy.  Medicine to stop contractions.  Medicines to help mature the baby's lungs. These may be prescribed  if the risk of delivery is high.  Medicines to prevent your baby from developing cerebral palsy. If the labor happens before 34 weeks of pregnancy, you may need to stay in the hospital. What should I do if I think I am in preterm labor? If you think that you are going into preterm labor, call your health care provider right away. How can I prevent preterm labor in future pregnancies? To increase your chance  of having a full-term pregnancy:  Do not use any tobacco products, such as cigarettes, chewing tobacco, and e-cigarettes. If you need help quitting, ask your health care provider.  Do not use street drugs or medicines that have not been prescribed to you during your pregnancy.  Talk with your health care provider before taking any herbal supplements, even if you have been taking them regularly.  Make sure you gain a healthy amount of weight during your pregnancy.  Watch for infection. If you think that you might have an infection, get it checked right away.  Make sure to tell your health care provider if you have gone into preterm labor before. This information is not intended to replace advice given to you by your health care provider. Make sure you discuss any questions you have with your health care provider. Document Released: 02/01/2004 Document Revised: 04/23/2016 Document Reviewed: 04/03/2016 Elsevier Interactive Patient Education  2017 [employer]

## 2016-11-11 NOTE — Progress Notes (Signed)
High Risk Pregnancy Diagnosis(es): h/o IUFD x 2 G5P0220 6287w0d Estimated Date of Delivery: 01/27/17 LMP  (LMP Unknown)   Urinalysis: Negative HPI:  Doing well, HD told her she needs rx for tdap- so given today, needs dental referral- given today BP, weight, and urine reviewed.  Reports good fm. Denies regular uc's, lof, vb, uti s/s. No complaints.  Fundal Height:  29 Fetal Heart rate:  143 u/s, bpp 8/8 Edema:  none  Reviewed pn2 results, ptl s/s, fkc All questions were answered Assessment: 6887w0d h/o IUFD x 2 Medication(s) Plans:  Baby asa Treatment Plan:  Weekly bpp's then 2x/wk testing at 32wks, then IOL @ 39wks Follow up in 1wk for high-risk OB appt and bpp u/s

## 2016-11-11 NOTE — Progress Notes (Signed)
US 29 wks,cephalic,BPP 8/8 ,fhr 143 bpm,afi 11.5 cm,cx 3.4 cm,post pl gr 1

## 2016-11-20 ENCOUNTER — Ambulatory Visit (INDEPENDENT_AMBULATORY_CARE_PROVIDER_SITE_OTHER): Payer: Medicaid Other

## 2016-11-20 ENCOUNTER — Encounter: Payer: Self-pay | Admitting: Women's Health

## 2016-11-20 ENCOUNTER — Ambulatory Visit (INDEPENDENT_AMBULATORY_CARE_PROVIDER_SITE_OTHER): Payer: Medicaid Other | Admitting: Women's Health

## 2016-11-20 VITALS — BP 118/60 | HR 76 | Wt 235.0 lb

## 2016-11-20 DIAGNOSIS — O09293 Supervision of pregnancy with other poor reproductive or obstetric history, third trimester: Secondary | ICD-10-CM

## 2016-11-20 DIAGNOSIS — O0990 Supervision of high risk pregnancy, unspecified, unspecified trimester: Secondary | ICD-10-CM

## 2016-11-20 DIAGNOSIS — Z331 Pregnant state, incidental: Secondary | ICD-10-CM

## 2016-11-20 DIAGNOSIS — Z3A31 31 weeks gestation of pregnancy: Secondary | ICD-10-CM | POA: Diagnosis not present

## 2016-11-20 DIAGNOSIS — Z1389 Encounter for screening for other disorder: Secondary | ICD-10-CM

## 2016-11-20 DIAGNOSIS — O0993 Supervision of high risk pregnancy, unspecified, third trimester: Secondary | ICD-10-CM | POA: Diagnosis not present

## 2016-11-20 LAB — POCT URINALYSIS DIPSTICK
Glucose, UA: NEGATIVE
Ketones, UA: NEGATIVE
Leukocytes, UA: NEGATIVE
Nitrite, UA: NEGATIVE
PROTEIN UA: NEGATIVE
RBC UA: NEGATIVE

## 2016-11-20 NOTE — Progress Notes (Signed)
High Risk Pregnancy Diagnosis(es): h/o IUFD x 2 G5P0220 3021w2d Estimated Date of Delivery: 01/27/17 BP 118/60   Pulse 76   Wt 235 lb (106.6 kg)   LMP  (LMP Unknown)   BMI 40.34 kg/m   Urinalysis: Negative HPI:  Doing well, got tdap 12/19 @ [facility] BP, weight, and urine reviewed.  Reports good fm. Denies regular uc's, lof, vb, uti s/s. No complaints.  Fundal Height:  30 Fetal Heart rate:  141 u/s Edema:  none  Reviewed today's u/s, bpp 8/8. Discussed ptl s/s, fkc All questions were answered Assessment: 5821w2d h/o IUFD x 2 Medication(s) Plans:  Baby asa Treatment Plan:  Weekly bpp's, then 2x/wk testing @ 32wks, then IOL @ 39wks Follow up in 1wk for high-risk OB appt and bpp u/s

## 2016-11-20 NOTE — Progress Notes (Signed)
US 30+2 wks,cephalic,afi 12.4 cm,post pl gr 1,normal ov's bilat,BPP 8/8 ,FHR 141 BPM

## 2016-11-20 NOTE — Patient Instructions (Signed)
Call the office ([phone]) or go to [facility] if:  You begin to have strong, frequent contractions  Your water breaks.  Sometimes it is a big gush of fluid, sometimes it is just a trickle that keeps getting your panties wet or running down your legs  You have vaginal bleeding.  It is normal to have a small amount of spotting if your cervix was checked.   You don't feel your baby moving like normal.  If you don't, get you something to eat and drink and lay down and focus on feeling your baby move.  You should feel at least 10 movements in 2 hours.  If you don't, you should call the office or go to [facility].     Preterm Labor and Birth Information The normal length of a pregnancy is 39-41 weeks. Preterm labor is when labor starts before 37 completed weeks of pregnancy. What are the risk factors for preterm labor? Preterm labor is more likely to occur in women who:  Have certain infections during pregnancy such as a bladder infection, sexually transmitted infection, or infection inside the uterus (chorioamnionitis).  Have a shorter-than-normal cervix.  Have gone into preterm labor before.  Have had surgery on their cervix.  Are younger than age 17 or older than age 35.  Are African American.  Are pregnant with twins or multiple babies (multiple gestation).  Take street drugs or smoke while pregnant.  Do not gain enough weight while pregnant.  Became pregnant shortly after having been pregnant. What are the symptoms of preterm labor? Symptoms of preterm labor include:  Cramps similar to those that can happen during a menstrual period. The cramps may happen with diarrhea.  Pain in the abdomen or lower back.  Regular uterine contractions that may feel like tightening of the abdomen.  A feeling of increased pressure in the pelvis.  Increased watery or bloody mucus discharge from the vagina.  Water breaking (ruptured amniotic sac). Why is it important to  recognize signs of preterm labor? It is important to recognize signs of preterm labor because babies who are born prematurely may not be fully developed. This can put them at an increased risk for:  Long-term (chronic) heart and lung problems.  Difficulty immediately after birth with regulating body systems, including blood sugar, body temperature, heart rate, and breathing rate.  Bleeding in the brain.  Cerebral palsy.  Learning difficulties.  Death. These risks are highest for babies who are born before 34 weeks of pregnancy. How is preterm labor treated? Treatment depends on the length of your pregnancy, your condition, and the health of your baby. It may involve:  Having a stitch (suture) placed in your cervix to prevent your cervix from opening too early (cerclage).  Taking or being given medicines, such as:  Hormone medicines. These may be given early in pregnancy to help support the pregnancy.  Medicine to stop contractions.  Medicines to help mature the baby's lungs. These may be prescribed if the risk of delivery is high.  Medicines to prevent your baby from developing cerebral palsy. If the labor happens before 34 weeks of pregnancy, you may need to stay in the hospital. What should I do if I think I am in preterm labor? If you think that you are going into preterm labor, call your health care provider right away. How can I prevent preterm labor in future pregnancies? To increase your chance of having a full-term pregnancy:  Do not use any tobacco products, such   as cigarettes, chewing tobacco, and e-cigarettes. If you need help quitting, ask your health care provider.  Do not use street drugs or medicines that have not been prescribed to you during your pregnancy.  Talk with your health care provider before taking any herbal supplements, even if you have been taking them regularly.  Make sure you gain a healthy amount of weight during your pregnancy.  Watch for  infection. If you think that you might have an infection, get it checked right away.  Make sure to tell your health care provider if you have gone into preterm labor before. This information is not intended to replace advice given to you by your health care provider. Make sure you discuss any questions you have with your health care provider. Document Released: 02/01/2004 Document Revised: 04/23/2016 Document Reviewed: 04/03/2016 Elsevier Interactive Patient Education  2017 [employer] Inc[employer]  

## 2016-11-28 ENCOUNTER — Ambulatory Visit (INDEPENDENT_AMBULATORY_CARE_PROVIDER_SITE_OTHER): Payer: Medicaid Other

## 2016-11-28 ENCOUNTER — Ambulatory Visit (INDEPENDENT_AMBULATORY_CARE_PROVIDER_SITE_OTHER): Payer: Medicaid Other | Admitting: Obstetrics and Gynecology

## 2016-11-28 ENCOUNTER — Encounter: Payer: Self-pay | Admitting: Obstetrics and Gynecology

## 2016-11-28 VITALS — BP 88/65 | HR 76 | Wt 234.2 lb

## 2016-11-28 DIAGNOSIS — O0990 Supervision of high risk pregnancy, unspecified, unspecified trimester: Secondary | ICD-10-CM

## 2016-11-28 DIAGNOSIS — Z1389 Encounter for screening for other disorder: Secondary | ICD-10-CM

## 2016-11-28 DIAGNOSIS — Z3A32 32 weeks gestation of pregnancy: Secondary | ICD-10-CM

## 2016-11-28 DIAGNOSIS — O09293 Supervision of pregnancy with other poor reproductive or obstetric history, third trimester: Secondary | ICD-10-CM

## 2016-11-28 DIAGNOSIS — Z331 Pregnant state, incidental: Secondary | ICD-10-CM

## 2016-11-28 DIAGNOSIS — O0993 Supervision of high risk pregnancy, unspecified, third trimester: Secondary | ICD-10-CM | POA: Diagnosis not present

## 2016-11-28 LAB — POCT URINALYSIS DIPSTICK
Glucose, UA: NEGATIVE
Ketones, UA: NEGATIVE
LEUKOCYTES UA: NEGATIVE
NITRITE UA: NEGATIVE
Protein, UA: NEGATIVE
RBC UA: NEGATIVE

## 2016-11-28 NOTE — Progress Notes (Signed)
High Risk Pregnancy HROB Diagnosis(es):   Hx FDIU x 2   G5P0220 1833w3d Estimated Date of Delivery: 01/27/17    HPI: The patient is being seen today for ongoing management of HROB as above. Today she reports good fm Patient reports good fetal movement, denies any bleeding and no rupture of membranes symptoms or regular contractions.   BP weight and urine results reviewed and noted. Blood pressure (!) 88/65, pulse 76, weight 234 lb 3.2 oz (106.2 kg).  Fundal Height:  34 Fetal Heart rate:  157 Physical Examination: Abdomen - soft, nontender, nondistended, no masses or organomegaly                                     Pelvic - not done                                     Edema:  neg  Urinalysis:NEGATIVE for prot gluc                 POSITIVE for none  Fetal Surveillance Testing today:  bpp 8/8  Lab and sonogram results have been reviewed. Comments: normal   Assessment:  1.  Pregnancy at 6333w3d,  [id number]   :  Good BPP                        2.  Biweekly testing after 32 wk                        3. IOL 39 or as indicated  Medication(s) Plans:  Asa pnv  Treatment Plan:  As above  Follow up in 0.5 weeks for appointment for high risk OB care,

## 2016-11-28 NOTE — Progress Notes (Signed)
US 31+1 wks,cephalic,post pl gr 2,bilat adnexa's wnl,BPP 8/8,afi 12.5 cm,fhr 157 bpm,

## 2016-12-03 ENCOUNTER — Ambulatory Visit (INDEPENDENT_AMBULATORY_CARE_PROVIDER_SITE_OTHER): Payer: Medicaid Other | Admitting: Women's Health

## 2016-12-03 ENCOUNTER — Encounter: Payer: Self-pay | Admitting: Women's Health

## 2016-12-03 VITALS — BP 120/72 | HR 79 | Wt 239.0 lb

## 2016-12-03 DIAGNOSIS — O09293 Supervision of pregnancy with other poor reproductive or obstetric history, third trimester: Secondary | ICD-10-CM

## 2016-12-03 DIAGNOSIS — O99323 Drug use complicating pregnancy, third trimester: Secondary | ICD-10-CM | POA: Diagnosis not present

## 2016-12-03 DIAGNOSIS — Z331 Pregnant state, incidental: Secondary | ICD-10-CM

## 2016-12-03 DIAGNOSIS — O0993 Supervision of high risk pregnancy, unspecified, third trimester: Secondary | ICD-10-CM

## 2016-12-03 DIAGNOSIS — Z1389 Encounter for screening for other disorder: Secondary | ICD-10-CM

## 2016-12-03 DIAGNOSIS — Z3A32 32 weeks gestation of pregnancy: Secondary | ICD-10-CM

## 2016-12-03 DIAGNOSIS — F1290 Cannabis use, unspecified, uncomplicated: Secondary | ICD-10-CM

## 2016-12-03 DIAGNOSIS — O0990 Supervision of high risk pregnancy, unspecified, unspecified trimester: Secondary | ICD-10-CM

## 2016-12-03 LAB — POCT URINALYSIS DIPSTICK
Blood, UA: NEGATIVE
GLUCOSE UA: NEGATIVE
KETONES UA: NEGATIVE
Leukocytes, UA: NEGATIVE
Nitrite, UA: NEGATIVE
PROTEIN UA: NEGATIVE

## 2016-12-03 NOTE — Patient Instructions (Signed)
Call the office ([phone]) or go to [facility] if:  You begin to have strong, frequent contractions  Your water breaks.  Sometimes it is a big gush of fluid, sometimes it is just a trickle that keeps getting your panties wet or running down your legs  You have vaginal bleeding.  It is normal to have a small amount of spotting if your cervix was checked.   You don't feel your baby moving like normal.  If you don't, get you something to eat and drink and lay down and focus on feeling your baby move.  You should feel at least 10 movements in 2 hours.  If you don't, you should call the office or go to [facility].     Preterm Labor and Birth Information The normal length of a pregnancy is 39-41 weeks. Preterm labor is when labor starts before 37 completed weeks of pregnancy. What are the risk factors for preterm labor? Preterm labor is more likely to occur in women who:  Have certain infections during pregnancy such as a bladder infection, sexually transmitted infection, or infection inside the uterus (chorioamnionitis).  Have a shorter-than-normal cervix.  Have gone into preterm labor before.  Have had surgery on their cervix.  Are younger than age 17 or older than age 35.  Are African American.  Are pregnant with twins or multiple babies (multiple gestation).  Take street drugs or smoke while pregnant.  Do not gain enough weight while pregnant.  Became pregnant shortly after having been pregnant. What are the symptoms of preterm labor? Symptoms of preterm labor include:  Cramps similar to those that can happen during a menstrual period. The cramps may happen with diarrhea.  Pain in the abdomen or lower back.  Regular uterine contractions that may feel like tightening of the abdomen.  A feeling of increased pressure in the pelvis.  Increased watery or bloody mucus discharge from the vagina.  Water breaking (ruptured amniotic sac). Why is it important to  recognize signs of preterm labor? It is important to recognize signs of preterm labor because babies who are born prematurely may not be fully developed. This can put them at an increased risk for:  Long-term (chronic) heart and lung problems.  Difficulty immediately after birth with regulating body systems, including blood sugar, body temperature, heart rate, and breathing rate.  Bleeding in the brain.  Cerebral palsy.  Learning difficulties.  Death. These risks are highest for babies who are born before 34 weeks of pregnancy. How is preterm labor treated? Treatment depends on the length of your pregnancy, your condition, and the health of your baby. It may involve:  Having a stitch (suture) placed in your cervix to prevent your cervix from opening too early (cerclage).  Taking or being given medicines, such as:  Hormone medicines. These may be given early in pregnancy to help support the pregnancy.  Medicine to stop contractions.  Medicines to help mature the baby's lungs. These may be prescribed if the risk of delivery is high.  Medicines to prevent your baby from developing cerebral palsy. If the labor happens before 34 weeks of pregnancy, you may need to stay in the hospital. What should I do if I think I am in preterm labor? If you think that you are going into preterm labor, call your health care provider right away. How can I prevent preterm labor in future pregnancies? To increase your chance of having a full-term pregnancy:  Do not use any tobacco products, such   as cigarettes, chewing tobacco, and e-cigarettes. If you need help quitting, ask your health care provider.  Do not use street drugs or medicines that have not been prescribed to you during your pregnancy.  Talk with your health care provider before taking any herbal supplements, even if you have been taking them regularly.  Make sure you gain a healthy amount of weight during your pregnancy.  Watch for  infection. If you think that you might have an infection, get it checked right away.  Make sure to tell your health care provider if you have gone into preterm labor before. This information is not intended to replace advice given to you by your health care provider. Make sure you discuss any questions you have with your health care provider. Document Released: 02/01/2004 Document Revised: 04/23/2016 Document Reviewed: 04/03/2016 Elsevier Interactive Patient Education  2017 [employer] Inc[employer]  

## 2016-12-03 NOTE — Progress Notes (Signed)
High Risk Pregnancy Diagnosis(es): h/o IUFD x 2 G5P0220 252w1d Estimated Date of Delivery: 01/27/17 BP 120/72   Pulse 79   Wt 239 lb (108.4 kg)   LMP  (LMP Unknown)   BMI 41.02 kg/m   Urinalysis: Negative HPI:  Doing well, no complaints BP, weight, and urine reviewed.  Reports good fm. Denies regular uc's, lof, vb, uti s/s.   Fundal Height:  36 Fetal Heart rate:  140, reactive NST Edema:  none  Reviewed ptl s/s, fkc All questions were answered Assessment: 2452w1d h/o IUFD x 2 Medication(s) Plans:  Asa  Treatment Plan:  2x/wk testing, IOL @ 39wks Follow up in 3d for high-risk OB appt and efw/afi/bpp u/s (s>d)

## 2016-12-04 LAB — PMP SCREEN PROFILE (10S), URINE
Amphetamine Screen, Ur: NEGATIVE ng/mL
BENZODIAZEPINE SCREEN, URINE: NEGATIVE ng/mL
Barbiturate Screen, Ur: NEGATIVE ng/mL
Cannabinoids Ur Ql Scn: POSITIVE ng/mL
Cocaine(Metab.)Screen, Urine: NEGATIVE ng/mL
Creatinine(Crt), U: 51.6 mg/dL (ref 20.0–300.0)
Methadone Scn, Ur: NEGATIVE ng/mL
OXYCODONE+OXYMORPHONE UR QL SCN: NEGATIVE ng/mL
Opiate Scrn, Ur: NEGATIVE ng/mL
PCP SCRN UR: NEGATIVE ng/mL
PROPOXYPHENE SCREEN: NEGATIVE ng/mL
Ph of Urine: 7 (ref 4.5–8.9)

## 2016-12-06 ENCOUNTER — Ambulatory Visit (INDEPENDENT_AMBULATORY_CARE_PROVIDER_SITE_OTHER): Payer: Medicaid Other

## 2016-12-06 ENCOUNTER — Encounter: Payer: Self-pay | Admitting: Obstetrics and Gynecology

## 2016-12-06 ENCOUNTER — Ambulatory Visit (INDEPENDENT_AMBULATORY_CARE_PROVIDER_SITE_OTHER): Payer: Medicaid Other | Admitting: Obstetrics and Gynecology

## 2016-12-06 VITALS — BP 120/68 | HR 85 | Wt 239.2 lb

## 2016-12-06 DIAGNOSIS — Z3A33 33 weeks gestation of pregnancy: Secondary | ICD-10-CM

## 2016-12-06 DIAGNOSIS — O0990 Supervision of high risk pregnancy, unspecified, unspecified trimester: Secondary | ICD-10-CM

## 2016-12-06 DIAGNOSIS — Z1389 Encounter for screening for other disorder: Secondary | ICD-10-CM

## 2016-12-06 DIAGNOSIS — O99323 Drug use complicating pregnancy, third trimester: Secondary | ICD-10-CM | POA: Diagnosis not present

## 2016-12-06 DIAGNOSIS — O09293 Supervision of pregnancy with other poor reproductive or obstetric history, third trimester: Secondary | ICD-10-CM

## 2016-12-06 DIAGNOSIS — Z331 Pregnant state, incidental: Secondary | ICD-10-CM | POA: Diagnosis not present

## 2016-12-06 DIAGNOSIS — O0993 Supervision of high risk pregnancy, unspecified, third trimester: Secondary | ICD-10-CM | POA: Diagnosis not present

## 2016-12-06 LAB — POCT URINALYSIS DIPSTICK
KETONES UA: NEGATIVE
Leukocytes, UA: NEGATIVE
Nitrite, UA: NEGATIVE
Protein, UA: NEGATIVE
RBC UA: NEGATIVE

## 2016-12-06 NOTE — Progress Notes (Signed)
US 32+4 wks,cephalic,BPP 8/8,bilat adnexa's wnl,post pl gr 3,fhr 159 bpm,afi 11.7 cm,efw 1956 g 42%

## 2016-12-06 NOTE — Progress Notes (Signed)
Patient ID: Jade Taylor, female   DOB: Mar 06, 1991, 26 y.o.   MRN: [id number]   High Risk Pregnancy HROB Diagnosis(es):   IUFD x 2  G5P0220 875w4d Estimated Date of Delivery: 01/27/17     HPI: The patient is being seen today for ongoing management of the above.  Chief Complaint  Patient presents with  . Routine Prenatal Visit    BPP  ____  Today she reports she has h/o 2 stillborns. Her last pregnancy was lost at 28w. She is not diabetic. Pt states she is a former smoker.   Discussion: 1. Pt has many questions regarding whether or not she should be on an additional blood thinner based on experiences in previous pregnancies. Discussed with pt risks and benefits of adding Lovenox to current regimen of daily ASA. Discussed plan of care should this be the best option for the pt.   At end of discussion, pt had opportunity to ask questions and has no further questions at this time.   Specific discussion of treatment plan as noted above. Greater than 50% was spent in counseling and coordination of care with the patient.   Total time greater than: 25 minutes.    Patient reports good fetal movement. She denies any bleeding and no rupture of membranes symptoms or regular contractions.  BP weight and urine results reviewed and noted. Blood pressure 120/68, pulse 85, weight 239 lb 3.2 oz (108.5 kg).  Fundal Height:  34 cm Fetal Heart rate:  159 bpm Physical Examination: Abdomen - soft, nontender, nondistended, no masses or organomegaly                                     Edema:  none  Urinalysis:NEGATIVE for all  Fetal Surveillance Testing today:  BPP 8/8, EFW 42.3%  Lab and sonogram results have been reviewed.  Assessment:  1.  Pregnancy at 2575w4d,  [id number]   :  Estimated Date of Delivery: 01/27/17                         2.  H/o IUFD x2, Treatment plan discussed with pt extensively                       Medication(s) Plans:  Baby ASA  Treatment Plan:   1. 2x/wk testing, IOL @  39wks 2. Discussed kick counting with pt   Follow up as scheduled for appointment for high risk OB care, twice weekly testing (T&F)   By signing my name below, I, Doreatha MartinEva Mathews, attest that this documentation has been prepared under the direction and in the presence of Tilda BurrowJohn V Elhadj Girton, MD. Electronically Signed: Doreatha MartinEva Mathews, ED Scribe. 12/06/16. 1:54 PM.  I personally performed the services described in this documentation, which was SCRIBED in my presence. The recorded information has been reviewed and considered accurate. It has been edited as necessary during review. Tilda BurrowFERGUSON,Kabao Leite V, MD

## 2016-12-09 ENCOUNTER — Ambulatory Visit (INDEPENDENT_AMBULATORY_CARE_PROVIDER_SITE_OTHER): Payer: Medicaid Other | Admitting: Obstetrics and Gynecology

## 2016-12-09 ENCOUNTER — Encounter: Payer: Medicaid Other | Admitting: Obstetrics and Gynecology

## 2016-12-09 ENCOUNTER — Encounter: Payer: Self-pay | Admitting: Obstetrics and Gynecology

## 2016-12-09 VITALS — BP 120/64 | HR 91 | Wt 234.4 lb

## 2016-12-09 DIAGNOSIS — O99323 Drug use complicating pregnancy, third trimester: Secondary | ICD-10-CM | POA: Diagnosis not present

## 2016-12-09 DIAGNOSIS — Z3A33 33 weeks gestation of pregnancy: Secondary | ICD-10-CM

## 2016-12-09 DIAGNOSIS — Z1389 Encounter for screening for other disorder: Secondary | ICD-10-CM | POA: Diagnosis not present

## 2016-12-09 DIAGNOSIS — Z331 Pregnant state, incidental: Secondary | ICD-10-CM

## 2016-12-09 DIAGNOSIS — O0990 Supervision of high risk pregnancy, unspecified, unspecified trimester: Secondary | ICD-10-CM

## 2016-12-09 DIAGNOSIS — O09293 Supervision of pregnancy with other poor reproductive or obstetric history, third trimester: Secondary | ICD-10-CM | POA: Diagnosis not present

## 2016-12-09 LAB — POCT URINALYSIS DIPSTICK
Blood, UA: NEGATIVE
GLUCOSE UA: NEGATIVE
KETONES UA: NEGATIVE
Leukocytes, UA: NEGATIVE
Nitrite, UA: NEGATIVE
Protein, UA: NEGATIVE

## 2016-12-09 NOTE — Progress Notes (Signed)
Patient ID: Jade Taylor, female   DOB: 23-Jan-1991, 26 y.o.   MRN: [id number]   High Risk Pregnancy HROB Diagnosis(es):   IUFD x 2  G5P0220 2637w0d Estimated Date of Delivery: 01/27/17     HPI: The patient is being seen today for ongoing management of the above.  Chief Complaint  Patient presents with  . Routine Prenatal Visit    leg/hip pain  ____  Today she reports she is doing well. She complains of some left lower back pain with radiation down left leg, which is worsened when lying on her left side.   Patient reports good fetal movement. She denies any bleeding and no rupture of membranes symptoms or regular contractions.  BP weight and urine results reviewed and noted. Blood pressure 120/64, pulse 91, weight 234 lb 6.4 oz (106.3 kg).  Fundal Height:  35 cm Fetal Heart rate:  144 bpm Physical Examination: Abdomen - soft, nontender, nondistended, no masses or organomegaly                                     Edema:  none  Urinalysis:NEGATIVE for all  Fetal Surveillance Testing today:  NST reactive  Lab and sonogram results have been reviewed.  Assessment:  1.  Pregnancy at 6137w0d,  [id number]   :  Estimated Date of Delivery: 01/27/17                         2.  IUFD x 2  Medication(s) Plans:  Baby ASA  Treatment Plan:   1. 2x/wk testing, IOL @ 39wks 2. Ferg to f/u with MFN for discussion of tx plan, need for Lovenox in addition to ASA  Follow up in 4 days for appointment for high risk OB care, BPP   By signing my name below, I, Doreatha MartinEva Mathews, attest that this documentation has been prepared under the direction and in the presence of Tilda BurrowJohn V Humna Moorehouse, MD. Electronically Signed: Doreatha MartinEva Mathews, ED Scribe. 12/09/16. 3:44 PM.  I personally performed the services described in this documentation, which was SCRIBED in my presence. The recorded information has been reviewed and considered accurate. It has been edited as necessary during review. Tilda BurrowFERGUSON,Karter Haire V, MD

## 2016-12-13 ENCOUNTER — Ambulatory Visit (INDEPENDENT_AMBULATORY_CARE_PROVIDER_SITE_OTHER): Payer: Medicaid Other | Admitting: Obstetrics & Gynecology

## 2016-12-13 ENCOUNTER — Ambulatory Visit (INDEPENDENT_AMBULATORY_CARE_PROVIDER_SITE_OTHER): Payer: Medicaid Other

## 2016-12-13 ENCOUNTER — Other Ambulatory Visit: Payer: Self-pay | Admitting: Obstetrics and Gynecology

## 2016-12-13 ENCOUNTER — Encounter: Payer: Self-pay | Admitting: Obstetrics & Gynecology

## 2016-12-13 VITALS — BP 118/78 | HR 88 | Wt 233.0 lb

## 2016-12-13 DIAGNOSIS — O99323 Drug use complicating pregnancy, third trimester: Secondary | ICD-10-CM

## 2016-12-13 DIAGNOSIS — Z1389 Encounter for screening for other disorder: Secondary | ICD-10-CM | POA: Diagnosis not present

## 2016-12-13 DIAGNOSIS — O09293 Supervision of pregnancy with other poor reproductive or obstetric history, third trimester: Secondary | ICD-10-CM | POA: Diagnosis not present

## 2016-12-13 DIAGNOSIS — Z331 Pregnant state, incidental: Secondary | ICD-10-CM

## 2016-12-13 DIAGNOSIS — O0990 Supervision of high risk pregnancy, unspecified, unspecified trimester: Secondary | ICD-10-CM

## 2016-12-13 DIAGNOSIS — O09299 Supervision of pregnancy with other poor reproductive or obstetric history, unspecified trimester: Secondary | ICD-10-CM

## 2016-12-13 DIAGNOSIS — R030 Elevated blood-pressure reading, without diagnosis of hypertension: Secondary | ICD-10-CM

## 2016-12-13 DIAGNOSIS — Z3A34 34 weeks gestation of pregnancy: Secondary | ICD-10-CM

## 2016-12-13 DIAGNOSIS — O0993 Supervision of high risk pregnancy, unspecified, third trimester: Secondary | ICD-10-CM | POA: Diagnosis not present

## 2016-12-13 LAB — POCT URINALYSIS DIPSTICK
Glucose, UA: NEGATIVE
KETONES UA: NEGATIVE
Leukocytes, UA: NEGATIVE
NITRITE UA: NEGATIVE
PROTEIN UA: NEGATIVE
RBC UA: NEGATIVE

## 2016-12-13 NOTE — Progress Notes (Signed)
Fetal Surveillance Testing today:  BPP 8/8   High Risk Pregnancy Diagnosis(es):   IUFD x 2  G5P0220 7189w4d Estimated Date of Delivery: 01/27/17  Blood pressure 118/78, pulse 88, weight 233 lb (105.7 kg).  Urinalysis: Negative   HPI: The patient is being seen today for ongoing management of as above. Today she reports some headaches   BP weight and urine results all reviewed and noted. Patient reports good fetal movement, denies any bleeding and no rupture of membranes symptoms or regular contractions.  Fundal Height:  35 Fetal Heart rate:  144 Edema:  none  Patient is without complaints other than noted in her HPI. All questions were answered.  All lab and sonogram results have been reviewed. Comments:    Assessment:  1.  Pregnancy at 4189w4d,  Estimated Date of Delivery: 01/27/17 :                          2.  IUFD 2nd trimester x 2                        3.  BP climbing a bit  Medication(s) Plans:  Baby ASA  Treatment Plan:  Cont twice weekly survillance, sonogram alternating with NST, add Doppler flow to sonograms going forward  Return in about 4 days (around 12/17/2016) for NST, HROB. for appointment for high risk OB care  No orders of the defined types were placed in this encounter.  Orders Placed This Encounter  Procedures  . POCT urinalysis dipstick

## 2016-12-13 NOTE — Progress Notes (Signed)
US 33+4 wks,cephalic,fhr 144 bpm,BPP 8/8,post pl gr 3,afi 14.8cm

## 2016-12-17 ENCOUNTER — Ambulatory Visit (INDEPENDENT_AMBULATORY_CARE_PROVIDER_SITE_OTHER): Payer: Medicaid Other | Admitting: Obstetrics & Gynecology

## 2016-12-17 ENCOUNTER — Encounter: Payer: Self-pay | Admitting: Obstetrics & Gynecology

## 2016-12-17 VITALS — BP 118/70 | HR 84 | Wt 231.0 lb

## 2016-12-17 DIAGNOSIS — O0993 Supervision of high risk pregnancy, unspecified, third trimester: Secondary | ICD-10-CM | POA: Diagnosis not present

## 2016-12-17 DIAGNOSIS — Z3A34 34 weeks gestation of pregnancy: Secondary | ICD-10-CM | POA: Diagnosis not present

## 2016-12-17 DIAGNOSIS — O09293 Supervision of pregnancy with other poor reproductive or obstetric history, third trimester: Secondary | ICD-10-CM | POA: Diagnosis not present

## 2016-12-17 DIAGNOSIS — Z1389 Encounter for screening for other disorder: Secondary | ICD-10-CM

## 2016-12-17 DIAGNOSIS — O0990 Supervision of high risk pregnancy, unspecified, unspecified trimester: Secondary | ICD-10-CM

## 2016-12-17 DIAGNOSIS — Z331 Pregnant state, incidental: Secondary | ICD-10-CM | POA: Diagnosis not present

## 2016-12-17 DIAGNOSIS — O09292 Supervision of pregnancy with other poor reproductive or obstetric history, second trimester: Secondary | ICD-10-CM

## 2016-12-17 LAB — POCT URINALYSIS DIPSTICK
GLUCOSE UA: NEGATIVE
KETONES UA: NEGATIVE
Leukocytes, UA: NEGATIVE
NITRITE UA: NEGATIVE
Protein, UA: NEGATIVE
RBC UA: NEGATIVE

## 2016-12-17 NOTE — Progress Notes (Signed)
Fetal Surveillance Testing today:  Reactive NST   High Risk Pregnancy Diagnosis(es):   IUFD x 2   G5P0220 2457w1d Estimated Date of Delivery: 01/27/17  Blood pressure 118/70, pulse 84, weight 231 lb (104.8 kg).  Urinalysis: Negative   HPI: The patient is being seen today for ongoing management of as above. Today she reports URI symptoms   BP weight and urine results all reviewed and noted. Patient reports good fetal movement, denies any bleeding and no rupture of membranes symptoms or regular contractions.  Fundal Height:  36 Fetal Heart rate:  135 Edema:  none  Patient is without complaints other than noted in her HPI. All questions were answered.  All lab and sonogram results have been reviewed. Comments:    Assessment:  1.  Pregnancy at 7157w1d,  Estimated Date of Delivery: 01/27/17 :                          2.  IUFD x 2                        3.    Medication(s) Plans:  Baby ASA  Treatment Plan:  Twice weekly surveillance with dellivery at 39 weeks  Return in about 3 days (around 12/20/2016) for BPP/sono, HROB. for appointment for high risk OB care  No orders of the defined types were placed in this encounter.  Orders Placed This Encounter  Procedures  . US Fetal BPP W/O Non Stress  . US UA Cord Doppler  . POCT urinalysis dipstick

## 2016-12-19 ENCOUNTER — Encounter: Payer: Self-pay | Admitting: Women's Health

## 2016-12-20 ENCOUNTER — Encounter: Payer: Self-pay | Admitting: Women's Health

## 2016-12-20 ENCOUNTER — Ambulatory Visit (INDEPENDENT_AMBULATORY_CARE_PROVIDER_SITE_OTHER): Payer: Medicaid Other

## 2016-12-20 ENCOUNTER — Ambulatory Visit (INDEPENDENT_AMBULATORY_CARE_PROVIDER_SITE_OTHER): Payer: Medicaid Other | Admitting: Obstetrics and Gynecology

## 2016-12-20 ENCOUNTER — Encounter: Payer: Self-pay | Admitting: Obstetrics and Gynecology

## 2016-12-20 VITALS — BP 100/70 | HR 78 | Wt 232.8 lb

## 2016-12-20 DIAGNOSIS — O1203 Gestational edema, third trimester: Secondary | ICD-10-CM

## 2016-12-20 DIAGNOSIS — Z1389 Encounter for screening for other disorder: Secondary | ICD-10-CM | POA: Diagnosis not present

## 2016-12-20 DIAGNOSIS — Z3A34 34 weeks gestation of pregnancy: Secondary | ICD-10-CM | POA: Diagnosis not present

## 2016-12-20 DIAGNOSIS — Z331 Pregnant state, incidental: Secondary | ICD-10-CM

## 2016-12-20 DIAGNOSIS — O09292 Supervision of pregnancy with other poor reproductive or obstetric history, second trimester: Secondary | ICD-10-CM

## 2016-12-20 DIAGNOSIS — O0993 Supervision of high risk pregnancy, unspecified, third trimester: Secondary | ICD-10-CM

## 2016-12-20 DIAGNOSIS — Z3A35 35 weeks gestation of pregnancy: Secondary | ICD-10-CM

## 2016-12-20 DIAGNOSIS — O09293 Supervision of pregnancy with other poor reproductive or obstetric history, third trimester: Secondary | ICD-10-CM | POA: Diagnosis not present

## 2016-12-20 DIAGNOSIS — O0990 Supervision of high risk pregnancy, unspecified, unspecified trimester: Secondary | ICD-10-CM

## 2016-12-20 LAB — POCT URINALYSIS DIPSTICK
GLUCOSE UA: NEGATIVE
KETONES UA: NEGATIVE
Leukocytes, UA: NEGATIVE
Nitrite, UA: NEGATIVE
Protein, UA: NEGATIVE
RBC UA: NEGATIVE

## 2016-12-20 NOTE — Progress Notes (Signed)
Jade GauzeGabrielle E Taylor is a 26 y.o. female  Fetal Surveillance Testing today:  Reactive NST   High Risk Pregnancy Diagnosis(es):   IUFD x 2  G5P0220 662w4d Estimated Date of Delivery: 01/27/17  Blood pressure 100/70, pulse 78, weight 232 lb 12.8 oz (105.6 kg).  Urinalysis: Negative   HPI: The patient is being seen today for ongoing management of as above. Today she has no acute complaints.    BP weight and urine results all reviewed and noted. Patient reports good fetal movement, denies any bleeding and no rupture of membranes symptoms or regular contractions.  Fundal Height:  36 cm Fetal Heart rate:  130 Edema:  1+  Patient is without complaints other than noted in her HPI. All questions were answered.  All lab and sonogram results have been reviewed. Comments: normal   Assessment:  1.  Pregnancy at 902w4d,  Estimated Date of Delivery: 01/27/17                        2.  IUFD x 2                         Medication(s) Plans:  Baby ASA   Treatment Plan:  Twice weekly surveillance with dellivery at 39 weeks  No Follow-up on file. for appointment for high risk OB care  No orders of the defined types were placed in this encounter.  Orders Placed This Encounter  Procedures  . POCT urinalysis dipstick   By signing my name below, I, Freida Busmaniana Omoyeni, attest that this documentation has been prepared under the direction and in the presence of Tilda BurrowJohn V Haylea Schlichting, MD . Electronically Signed: Freida Busmaniana Omoyeni, Scribe. 12/20/2016. 11:56 AM. I personally performed the services described in this documentation, which was SCRIBED in my presence. The recorded information has been reviewed and considered accurate. It has been edited as necessary during review. Tilda BurrowFERGUSON,Kingsten Enfield V, MD

## 2016-12-20 NOTE — Progress Notes (Signed)
US 34+4 wks,cephalic,post pl gr 3,BPP 8/8,AFI 14 cm,fhr 136 bpm,bilat adnexa's wnl,RI .64,.64,S/D 2.79

## 2016-12-24 ENCOUNTER — Encounter (HOSPITAL_COMMUNITY): Payer: Self-pay | Admitting: *Deleted

## 2016-12-24 ENCOUNTER — Ambulatory Visit (INDEPENDENT_AMBULATORY_CARE_PROVIDER_SITE_OTHER): Payer: Medicaid Other | Admitting: Obstetrics & Gynecology

## 2016-12-24 ENCOUNTER — Inpatient Hospital Stay (HOSPITAL_COMMUNITY)
Admission: AD | Admit: 2016-12-24 | Discharge: 2016-12-24 | Disposition: A | Discharge status: Home / Self Care | Payer: Medicaid Other | Source: Ambulatory Visit | Attending: Obstetrics and Gynecology | Admitting: Obstetrics and Gynecology

## 2016-12-24 ENCOUNTER — Telehealth: Payer: Self-pay | Admitting: *Deleted

## 2016-12-24 ENCOUNTER — Encounter: Payer: Self-pay | Admitting: Obstetrics & Gynecology

## 2016-12-24 VITALS — BP 110/60 | HR 89 | Wt 235.4 lb

## 2016-12-24 DIAGNOSIS — O9989 Other specified diseases and conditions complicating pregnancy, childbirth and the puerperium: Secondary | ICD-10-CM | POA: Diagnosis not present

## 2016-12-24 DIAGNOSIS — O0993 Supervision of high risk pregnancy, unspecified, third trimester: Secondary | ICD-10-CM

## 2016-12-24 DIAGNOSIS — N898 Other specified noninflammatory disorders of vagina: Secondary | ICD-10-CM

## 2016-12-24 DIAGNOSIS — Z331 Pregnant state, incidental: Secondary | ICD-10-CM | POA: Diagnosis not present

## 2016-12-24 DIAGNOSIS — O09293 Supervision of pregnancy with other poor reproductive or obstetric history, third trimester: Secondary | ICD-10-CM | POA: Diagnosis not present

## 2016-12-24 DIAGNOSIS — F4310 Post-traumatic stress disorder, unspecified: Secondary | ICD-10-CM | POA: Insufficient documentation

## 2016-12-24 DIAGNOSIS — Z87891 Personal history of nicotine dependence: Secondary | ICD-10-CM | POA: Diagnosis not present

## 2016-12-24 DIAGNOSIS — O99513 Diseases of the respiratory system complicating pregnancy, third trimester: Secondary | ICD-10-CM | POA: Insufficient documentation

## 2016-12-24 DIAGNOSIS — O0990 Supervision of high risk pregnancy, unspecified, unspecified trimester: Secondary | ICD-10-CM

## 2016-12-24 DIAGNOSIS — J45909 Unspecified asthma, uncomplicated: Secondary | ICD-10-CM | POA: Diagnosis not present

## 2016-12-24 DIAGNOSIS — Z3A35 35 weeks gestation of pregnancy: Secondary | ICD-10-CM | POA: Insufficient documentation

## 2016-12-24 DIAGNOSIS — Z1389 Encounter for screening for other disorder: Secondary | ICD-10-CM | POA: Diagnosis not present

## 2016-12-24 DIAGNOSIS — Z3689 Encounter for other specified antenatal screening: Secondary | ICD-10-CM

## 2016-12-24 DIAGNOSIS — O26893 Other specified pregnancy related conditions, third trimester: Secondary | ICD-10-CM | POA: Diagnosis present

## 2016-12-24 LAB — URINALYSIS, ROUTINE W REFLEX MICROSCOPIC
BILIRUBIN URINE: NEGATIVE
Glucose, UA: NEGATIVE mg/dL
Hgb urine dipstick: NEGATIVE
KETONES UR: NEGATIVE mg/dL
Leukocytes, UA: NEGATIVE
NITRITE: NEGATIVE
Protein, ur: NEGATIVE mg/dL
SPECIFIC GRAVITY, URINE: 1.003 — AB (ref 1.005–1.030)
pH: 7 (ref 5.0–8.0)

## 2016-12-24 LAB — WET PREP, GENITAL
Sperm: NONE SEEN
TRICH WET PREP: NONE SEEN
YEAST WET PREP: NONE SEEN

## 2016-12-24 LAB — POCT URINALYSIS DIPSTICK
GLUCOSE UA: NEGATIVE
Ketones, UA: NEGATIVE
Leukocytes, UA: NEGATIVE
Nitrite, UA: NEGATIVE
RBC UA: NEGATIVE

## 2016-12-24 NOTE — MAU Note (Signed)
When she peed, it was brownish red when she wiped.  No recent exams.  Has been having some cramping today. Was at the dr earlier, had reactive NST.  Is really scared

## 2016-12-24 NOTE — Discharge Instructions (Signed)
Introduction Patient Name: ________________________________________________ Patient Due Date: ____________________ What is a fetal movement count? A fetal movement count is the number of times that you feel your baby move during a certain amount of time. This may also be called a fetal kick count. A fetal movement count is recommended for every pregnant woman. You may be asked to start counting fetal movements as early as week 28 of your pregnancy. Pay attention to when your baby is most active. You may notice your baby's sleep and wake cycles. You may also notice things that make your baby move more. You should do a fetal movement count:  When your baby is normally most active.  At the same time each day. A good time to count movements is while you are resting, after having something to eat and drink. How do I count fetal movements? 1. Find a quiet, comfortable area. Sit, or lie down on your side. 2. Write down the date, the start time and stop time, and the number of movements that you felt between those two times. Take this information with you to your health care visits. 3. For 2 hours, count kicks, flutters, swishes, rolls, and jabs. You should feel at least 10 movements during 2 hours. 4. You may stop counting after you have felt 10 movements. 5. If you do not feel 10 movements in 2 hours, have something to eat and drink. Then, keep resting and counting for 1 hour. If you feel at least 4 movements during that hour, you may stop counting. Contact a health care provider if:  You feel fewer than 4 movements in 2 hours.  Your baby is not moving like he or she usually does. Date: ____________ Start time: ____________ Stop time: ____________ Movements: ____________ Date: ____________ Start time: ____________ Stop time: ____________ Movements: ____________ Date: ____________ Start time: ____________ Stop time: ____________ Movements: ____________ Date: ____________ Start time: ____________  Stop time: ____________ Movements: ____________ Date: ____________ Start time: ____________ Stop time: ____________ Movements: ____________ Date: ____________ Start time: ____________ Stop time: ____________ Movements: ____________ Date: ____________ Start time: ____________ Stop time: ____________ Movements: ____________ Date: ____________ Start time: ____________ Stop time: ____________ Movements: ____________ Date: ____________ Start time: ____________ Stop time: ____________ Movements: ____________ This information is not intended to replace advice given to you by your health care provider. Make sure you discuss any questions you have with your health care provider. Document Released: 12/11/2006 Document Revised: 07/10/2016 Document Reviewed: 12/21/2015 Elsevier Interactive Patient Education  2017 Elsevier Inc.  

## 2016-12-24 NOTE — Progress Notes (Signed)
Fetal Surveillance Testing today:  Reactive NST   High Risk Pregnancy Diagnosis(es):   Hx of IUFD x 2(24, 28 weeks)  [id number] 2969w1d Estimated Date of Delivery: 01/27/17  Blood pressure 110/60, pulse 89, weight 235 lb 6.4 oz (106.8 kg).  Urinalysis: Negative   HPI: The patient is being seen today for ongoing management of as above. Today she reports no problems   BP weight and urine results all reviewed and noted. Patient reports good fetal movement, denies any bleeding and no rupture of membranes symptoms or regular contractions.  Fundal Height:  38 Fetal Heart rate:  135 Edema:  none  Patient is without complaints other than noted in her HPI. All questions were answered.  All lab and sonogram results have been reviewed. Comments:    Assessment:  1.  Pregnancy at 6669w1d,  Estimated Date of Delivery: 01/27/17 :                          2.  Hx of IUFD x2, 24, 28 weeks                        3.    Medication(s) Plans:  Baby ASA  Treatment Plan:  Twice weekly surveillance, sonogram alternating with NST, induction at 39 weeks or as clinically indicated   Return in about 3 days (around 12/27/2016) for BPP/sono, HROB. for appointment for high risk OB care  No orders of the defined types were placed in this encounter.  Orders Placed This Encounter  Procedures  . US Fetal BPP W/O Non Stress  . US UA Cord Doppler  . POCT urinalysis dipstick

## 2016-12-24 NOTE — MAU Provider Note (Signed)
History     CSN: [id number]  Arrival date and time: 12/24/16 1635   First Provider Initiated Contact with Patient 12/24/16 1803      No chief complaint on file.  [id number] @35 .1 weeks here with concerns of stain on toilet paper. She reports around 3pm she went to the BR, wiped and saw a yellow/brown color on the toilet tissue. She was concerned it was blood. She denies LOF or vaginal discharge. She reports good FM. No HA, visual disturbances, SOB, or epigastric pain. Review of records reveals pregnancy has been complicated by hx IUFD x2 and marijuana use. She admits to only 1 bottle of water today.      OB History    Gravida Para Term Preterm AB Living   5 2 0 2 2 0   SAB TAB Ectopic Multiple Live Births   1 1            Past Medical History:  Diagnosis Date  . Asthma   . Chronic pain syndrome   . 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 Hilda LiasKeeling  . PTSD (post-traumatic stress disorder)     Past Surgical History:  Procedure Laterality Date  . CARPAL TUNNEL RELEASE    . WISDOM TOOTH EXTRACTION      Family History  Problem Relation Age of Onset  . Bipolar disorder Mother   . Schizophrenia Mother   . Stroke Father   . Heart disease Maternal Grandmother   . Heart disease Paternal Grandfather     Social History  Substance Use Topics  . Smoking status: Former Smoker    Packs/day: 0.50    Years: 0.50    Types: Cigarettes    Quit date: 07/02/2016  . Smokeless tobacco: Never Used  . Alcohol use No     Comment: ocassilly    Allergies:  Allergies  Allergen Reactions  . Metronidazole Hives, Shortness Of Breath, Rash and Other (See Comments)    Burning sensation  . Penicillins Shortness Of Breath, Itching and Rash    Has patient had a PCN reaction causing immediate rash, facial/tongue/throat swelling, SOB or lightheadedness with hypotension: No Has patient had a PCN reaction causing severe rash involving mucus membranes or skin necrosis:  No Has patient had a PCN reaction that required hospitalization Yes Has patient had a PCN reaction occurring within the last 10 years: No If all of the above answers are "NO", then may proceed with Cephalosporin use.   Marland Kitchen. Amoxicillin Hives    Prescriptions Prior to Admission  Medication Sig Dispense Refill Last Dose  . aspirin EC 81 MG tablet Take 81 mg by mouth daily.   12/24/2016 at Unknown time  . Prenatal Vit-Fe Fumarate-FA (PRENATAL MULTIVITAMIN) TABS tablet Take 1 tablet by mouth daily at 12 noon.   12/24/2016 at Unknown time    Review of Systems  Gastrointestinal: Negative for abdominal pain.  Genitourinary: Positive for vaginal discharge. Negative for vaginal bleeding.   Physical Exam   Blood pressure 126/74, pulse 87, temperature 98.3 F (36.8 C), temperature source Oral, resp. rate 18, height 5\' 2"  (1.575 m), weight 106.7 kg (235 lb 4 oz).  Physical Exam  Constitutional: She is oriented to person, place, and time. She appears well-developed and well-nourished. No distress.  HENT:  Head: Normocephalic and atraumatic.  Neck: Normal range of motion.  Cardiovascular: Normal rate.   Respiratory: Effort normal.  GI: Soft. She exhibits no distension. There is no tenderness.  gravid  Genitourinary:  Genitourinary Comments: External: no lesions or erythema Vagina: rugated, parous, thin white discharge, no blood SVE: 1/50/-2, vtx   Musculoskeletal: Normal range of motion.  Neurological: She is alert and oriented to person, place, and time.  Skin: Skin is warm and dry.   EFM: 145 bpm, mod variability, + accels, no decels Toco: rare  Results for orders placed or performed during the hospital encounter of 12/24/16 (from the past 24 hour(s))  Urinalysis, Routine w reflex microscopic     Status: Abnormal   Collection Time: 12/24/16  5:05 PM  Result Value Ref Range   Color, Urine STRAW (A) YELLOW   APPearance CLEAR CLEAR   Specific Gravity, Urine 1.003 (L) 1.005 - 1.030   pH  7.0 5.0 - 8.0   Glucose, UA NEGATIVE NEGATIVE mg/dL   Hgb urine dipstick NEGATIVE NEGATIVE   Bilirubin Urine NEGATIVE NEGATIVE   Ketones, ur NEGATIVE NEGATIVE mg/dL   Protein, ur NEGATIVE NEGATIVE mg/dL   Nitrite NEGATIVE NEGATIVE   Leukocytes, UA NEGATIVE NEGATIVE  Wet prep, genital     Status: Abnormal   Collection Time: 12/24/16  6:11 PM  Result Value Ref Range   Yeast Wet Prep HPF POC NONE SEEN NONE SEEN   Trich, Wet Prep NONE SEEN NONE SEEN   Clue Cells Wet Prep HPF POC PRESENT (A) NONE SEEN   WBC, Wet Prep HPF POC MODERATE (A) NONE SEEN   Sperm NONE SEEN    MAU Course  Procedures  MDM Labs ordered and reviewed. No evidence of PTL. No signs of VB. Staining may have been concentrated urine or vaginal discharge. FHT reassurring. Stable for discharge home.  Assessment and Plan   1. Vaginal discharge during pregnancy in third trimester   2. Supervision of high risk pregnancy, antepartum   3. [redacted] weeks gestation of pregnancy   4. NST (non-stress test) reactive    Discharge home Follow up at [facility] in 2 days as scheduled [facility] daily PTL precautions  Allergies as of 12/24/2016      Reactions   Metronidazole Hives, Shortness Of Breath, Rash, Other (See Comments)   Burning sensation   Penicillins Shortness Of Breath, Itching, Rash   Has patient had a PCN reaction causing immediate rash, facial/tongue/throat swelling, SOB or lightheadedness with hypotension: No Has patient had a PCN reaction causing severe rash involving mucus membranes or skin necrosis: No Has patient had a PCN reaction that required hospitalization Yes Has patient had a PCN reaction occurring within the last 10 years: No If all of the above answers are "NO", then may proceed with Cephalosporin use.   Amoxicillin Hives      Medication List    TAKE these medications   aspirin EC 81 MG tablet Take 81 mg by mouth daily.   prenatal multivitamin Tabs tablet Take 1 tablet by mouth daily at 12 noon.       Donette Larry, CNM 12/24/2016, 6:18 PM

## 2016-12-24 NOTE — Telephone Encounter (Signed)
Patient called very upset/crying stating she noticed some dark brown blood when she went to the bathroom. She has been having some intense cramps, no leaking of fluid and baby is moving. Pt instructed to go to MAU to be evaluated per Dr Despina HiddenEure. Pt verbalized understanding.

## 2016-12-25 LAB — GC/CHLAMYDIA PROBE AMP (~~LOC~~) NOT AT ARMC
Chlamydia: NEGATIVE
Neisseria Gonorrhea: NEGATIVE

## 2016-12-27 ENCOUNTER — Encounter: Payer: Self-pay | Admitting: Obstetrics & Gynecology

## 2016-12-27 ENCOUNTER — Ambulatory Visit (INDEPENDENT_AMBULATORY_CARE_PROVIDER_SITE_OTHER): Payer: Medicaid Other

## 2016-12-27 ENCOUNTER — Ambulatory Visit (INDEPENDENT_AMBULATORY_CARE_PROVIDER_SITE_OTHER): Payer: Medicaid Other | Admitting: Obstetrics & Gynecology

## 2016-12-27 VITALS — BP 110/60 | HR 74 | Wt 236.0 lb

## 2016-12-27 DIAGNOSIS — Z331 Pregnant state, incidental: Secondary | ICD-10-CM

## 2016-12-27 DIAGNOSIS — Z1389 Encounter for screening for other disorder: Secondary | ICD-10-CM | POA: Diagnosis not present

## 2016-12-27 DIAGNOSIS — O0993 Supervision of high risk pregnancy, unspecified, third trimester: Secondary | ICD-10-CM | POA: Diagnosis not present

## 2016-12-27 DIAGNOSIS — Z3A36 36 weeks gestation of pregnancy: Secondary | ICD-10-CM | POA: Diagnosis not present

## 2016-12-27 DIAGNOSIS — O0990 Supervision of high risk pregnancy, unspecified, unspecified trimester: Secondary | ICD-10-CM

## 2016-12-27 DIAGNOSIS — O09293 Supervision of pregnancy with other poor reproductive or obstetric history, third trimester: Secondary | ICD-10-CM | POA: Diagnosis not present

## 2016-12-27 LAB — POCT URINALYSIS DIPSTICK
Blood, UA: NEGATIVE
Glucose, UA: NEGATIVE
KETONES UA: NEGATIVE
LEUKOCYTES UA: NEGATIVE
Nitrite, UA: NEGATIVE

## 2016-12-27 NOTE — Progress Notes (Signed)
Fetal Surveillance Testing today:  BPP 8/8 with excellent Doppler flow ratios   High Risk Pregnancy Diagnosis(es):   Hx of Fetal demise x 2  G5P0220 10537w4d Estimated Date of Delivery: 01/27/17  Blood pressure 110/60, pulse 74, weight 236 lb (107 kg).  Urinalysis: Negative   HPI: The patient is being seen today for ongoing management of as above. Today she reports no problems   BP weight and urine results all reviewed and noted. Patient reports good fetal movement, denies any bleeding and no rupture of membranes symptoms or regular contractions.  Fundal Height:  36 Fetal Heart rate:  132 Edema:  none  Patient is without complaints other than noted in her HPI. All questions were answered.  All lab and sonogram results have been reviewed. Comments:    Assessment:  1.  Pregnancy at 437w4d,  Estimated Date of Delivery: 01/27/17 :                          2.  Hx of IUFD x 2                        3.    Medication(s) Plans:  Baby aspirin  Treatment Plan:  Twice weekly assessments, NST alternating with sonogram, needs EFW next week, deliver at 39 weeks or as clinically indicated  Return in about 4 days (around 12/31/2016) for NST, HROB. for appointment for high risk OB care  No orders of the defined types were placed in this encounter.  Orders Placed This Encounter  Procedures  . POCT urinalysis dipstick

## 2016-12-27 NOTE — Progress Notes (Signed)
US 35+4 wks,cephalic,BPP 8/8,FHR 132 bpm,post pl gr 3,afi 14.5 cm, RI .65,.68

## 2016-12-31 ENCOUNTER — Encounter: Payer: Self-pay | Admitting: Obstetrics & Gynecology

## 2016-12-31 ENCOUNTER — Ambulatory Visit (INDEPENDENT_AMBULATORY_CARE_PROVIDER_SITE_OTHER): Payer: Medicaid Other | Admitting: Obstetrics & Gynecology

## 2016-12-31 VITALS — BP 122/70 | HR 80 | Wt 236.0 lb

## 2016-12-31 DIAGNOSIS — O09293 Supervision of pregnancy with other poor reproductive or obstetric history, third trimester: Secondary | ICD-10-CM | POA: Diagnosis not present

## 2016-12-31 DIAGNOSIS — Z331 Pregnant state, incidental: Secondary | ICD-10-CM | POA: Diagnosis not present

## 2016-12-31 DIAGNOSIS — Z1389 Encounter for screening for other disorder: Secondary | ICD-10-CM

## 2016-12-31 DIAGNOSIS — Z3A36 36 weeks gestation of pregnancy: Secondary | ICD-10-CM

## 2016-12-31 DIAGNOSIS — O0993 Supervision of high risk pregnancy, unspecified, third trimester: Secondary | ICD-10-CM | POA: Diagnosis not present

## 2016-12-31 LAB — POCT URINALYSIS DIPSTICK
Glucose, UA: NEGATIVE
KETONES UA: NEGATIVE
LEUKOCYTES UA: NEGATIVE
Nitrite, UA: NEGATIVE
PROTEIN UA: NEGATIVE
RBC UA: NEGATIVE

## 2016-12-31 NOTE — Progress Notes (Signed)
Fetal Surveillance Testing today:  Reactive NST   High Risk Pregnancy Diagnosis(es):   Hx of IUFD x 2  G5P0220 7238w1d Estimated Date of Delivery: 01/27/17  Blood pressure 122/70, pulse 80, weight 236 lb (107 kg).  Urinalysis: Negative   HPI: The patient is being seen today for ongoing management of as above. Today she reports no problems   BP weight and urine results all reviewed and noted. Patient reports good fetal movement, denies any bleeding and no rupture of membranes symptoms or regular contractions.  Fundal Height:  38 Fetal Heart rate:  140 Edema:  none  Patient is without complaints other than noted in her HPI. All questions were answered.  All lab and sonogram results have been reviewed. Comments:    Assessment:  1.  Pregnancy at 1238w1d,  Estimated Date of Delivery: 01/27/17 :                          2.  Hx of IUFD x 2                        3.    Medication(s) Plans:  none  Treatment Plan:  Twice weekly surveillance, sonogram alternating with NST, induction at 39 weeks or as clinically indicated   No Follow-up on file. for appointment for high risk OB care  No orders of the defined types were placed in this encounter.  Orders Placed This Encounter  Procedures  . US Fetal BPP W/O Non Stress  . US UA Cord Doppler  . POCT urinalysis dipstick

## 2017-01-02 ENCOUNTER — Other Ambulatory Visit: Payer: Medicaid Other

## 2017-01-03 ENCOUNTER — Encounter: Payer: Self-pay | Admitting: Obstetrics & Gynecology

## 2017-01-03 ENCOUNTER — Ambulatory Visit (INDEPENDENT_AMBULATORY_CARE_PROVIDER_SITE_OTHER): Payer: Medicaid Other | Admitting: Obstetrics & Gynecology

## 2017-01-03 ENCOUNTER — Other Ambulatory Visit: Payer: Self-pay | Admitting: Obstetrics & Gynecology

## 2017-01-03 ENCOUNTER — Ambulatory Visit (INDEPENDENT_AMBULATORY_CARE_PROVIDER_SITE_OTHER): Payer: Medicaid Other

## 2017-01-03 VITALS — BP 118/80 | HR 80 | Wt 235.0 lb

## 2017-01-03 DIAGNOSIS — Z3A36 36 weeks gestation of pregnancy: Secondary | ICD-10-CM | POA: Diagnosis not present

## 2017-01-03 DIAGNOSIS — Z3685 Encounter for antenatal screening for Streptococcus B: Secondary | ICD-10-CM

## 2017-01-03 DIAGNOSIS — O1213 Gestational proteinuria, third trimester: Secondary | ICD-10-CM | POA: Diagnosis not present

## 2017-01-03 DIAGNOSIS — O09293 Supervision of pregnancy with other poor reproductive or obstetric history, third trimester: Secondary | ICD-10-CM

## 2017-01-03 DIAGNOSIS — Z1389 Encounter for screening for other disorder: Secondary | ICD-10-CM | POA: Diagnosis not present

## 2017-01-03 DIAGNOSIS — O0990 Supervision of high risk pregnancy, unspecified, unspecified trimester: Secondary | ICD-10-CM

## 2017-01-03 DIAGNOSIS — O0993 Supervision of high risk pregnancy, unspecified, third trimester: Secondary | ICD-10-CM | POA: Diagnosis not present

## 2017-01-03 DIAGNOSIS — Z331 Pregnant state, incidental: Secondary | ICD-10-CM | POA: Diagnosis not present

## 2017-01-03 DIAGNOSIS — Z3A37 37 weeks gestation of pregnancy: Secondary | ICD-10-CM

## 2017-01-03 LAB — POCT URINALYSIS DIPSTICK
Blood, UA: NEGATIVE
GLUCOSE UA: NEGATIVE
KETONES UA: NEGATIVE
LEUKOCYTES UA: NEGATIVE
NITRITE UA: NEGATIVE

## 2017-01-03 LAB — OB RESULTS CONSOLE GBS: GBS: NEGATIVE

## 2017-01-03 NOTE — Addendum Note (Signed)
Addended by: Federico FlakeNES, Aneta Hendershott A on: 01/03/2017 01:54 PM   Modules accepted: Orders

## 2017-01-03 NOTE — Progress Notes (Signed)
Fetal Surveillance Testing today:  BPP 8/8 Dopplers normal   High Risk Pregnancy Diagnosis(es):   IUFD x 2  G5P0220 5977w4d Estimated Date of Delivery: 01/27/17  Blood pressure 118/80, pulse 80, weight 235 lb (106.6 kg).  Urinalysis: Positive for trace protein   HPI: The patient is being seen today for ongoing management of hx of IUFD x 2. Today she reports no prbolems   BP weight and urine results all reviewed and noted. Patient reports good fetal movement, denies any bleeding and no rupture of membranes symptoms or regular contractions.  Fundal Height:  38 Fetal Heart rate:  145 Edema:  none  Patient is without complaints other than noted in her HPI. All questions were answered.  All lab and sonogram results have been reviewed. Comments:    Assessment:  1.  Pregnancy at 4077w4d,  Estimated Date of Delivery: 01/27/17 :                          2.  Hx of IUFD x2                        3.    Medication(s) Plans:    Treatment Plan:  Twice weekly surveillance, sonogram alternating with NST, induction at 39 weeks or as clinically indicated   Return in about 4 days (around 01/07/2017) for NST, HROB. for appointment for high risk OB care  No orders of the defined types were placed in this encounter.  Orders Placed This Encounter  Procedures  . POCT urinalysis dipstick

## 2017-01-03 NOTE — Progress Notes (Signed)
US 36+4 wks,cephalic,BPP 8/8,post pl gr 3,bilat adnexa wnl,fhr 142 bpm,RI .69,.69 wnl,afi 14.8 cm,EFW 3050 g 57%

## 2017-01-05 LAB — STREP GP B NAA+RFLX: STREP GP B NAA+RFLX: NEGATIVE

## 2017-01-07 ENCOUNTER — Encounter: Payer: Self-pay | Admitting: Obstetrics & Gynecology

## 2017-01-07 ENCOUNTER — Ambulatory Visit (INDEPENDENT_AMBULATORY_CARE_PROVIDER_SITE_OTHER): Payer: Medicaid Other | Admitting: Obstetrics & Gynecology

## 2017-01-07 VITALS — BP 100/60 | HR 80 | Wt 242.0 lb

## 2017-01-07 DIAGNOSIS — Z1389 Encounter for screening for other disorder: Secondary | ICD-10-CM

## 2017-01-07 DIAGNOSIS — Z3A37 37 weeks gestation of pregnancy: Secondary | ICD-10-CM | POA: Diagnosis not present

## 2017-01-07 DIAGNOSIS — O0993 Supervision of high risk pregnancy, unspecified, third trimester: Secondary | ICD-10-CM | POA: Diagnosis not present

## 2017-01-07 DIAGNOSIS — O99513 Diseases of the respiratory system complicating pregnancy, third trimester: Secondary | ICD-10-CM | POA: Diagnosis not present

## 2017-01-07 DIAGNOSIS — Z331 Pregnant state, incidental: Secondary | ICD-10-CM

## 2017-01-07 DIAGNOSIS — O0990 Supervision of high risk pregnancy, unspecified, unspecified trimester: Secondary | ICD-10-CM

## 2017-01-07 DIAGNOSIS — O09293 Supervision of pregnancy with other poor reproductive or obstetric history, third trimester: Secondary | ICD-10-CM | POA: Diagnosis not present

## 2017-01-07 LAB — POCT URINALYSIS DIPSTICK
GLUCOSE UA: NEGATIVE
KETONES UA: NEGATIVE
Leukocytes, UA: NEGATIVE
Nitrite, UA: NEGATIVE
Protein, UA: NEGATIVE
RBC UA: NEGATIVE

## 2017-01-07 NOTE — Progress Notes (Signed)
  Fetal Surveillance Testing today:  Reactive NST   High Risk Pregnancy Diagnosis(es):   Hx of IUFD x 2   G5P0220 6116w1d Estimated Date of Delivery: 01/27/17  Blood pressure 100/60, pulse 80, weight 242 lb (109.8 kg).  Urinalysis: Negative   HPI: The patient is being seen today for ongoing management of as above. Today she reports cold symptoms   BP weight and urine results all reviewed and noted. Patient reports good fetal movement, denies any bleeding and no rupture of membranes symptoms or regular contractions.  Fundal Height:  38 Fetal Heart rate:  150 Edema:  none  Patient is without complaints other than noted in her HPI. All questions were answered.  All lab and sonogram results have been reviewed. Comments:    Assessment:  1.  Pregnancy at 3116w1d,  Estimated Date of Delivery: 01/27/17 :                          2.  Hx of IUFD x 2                        3.    Medication(s) Plans:  Baby ASA  Treatment Plan:  Twice weekly surveillance, sonogram alternating with NST, induction at 39 weeks or as clinically indicated   Return in about 3 days (around 01/10/2017) for BPP/sono, HROB. for appointment for high risk OB care  No orders of the defined types were placed in this encounter.  Orders Placed This Encounter  Procedures  . US Fetal BPP W/O Non Stress  . US UA Cord Doppler  . POCT urinalysis dipstick

## 2017-01-10 ENCOUNTER — Ambulatory Visit (INDEPENDENT_AMBULATORY_CARE_PROVIDER_SITE_OTHER): Payer: Medicaid Other

## 2017-01-10 DIAGNOSIS — O09293 Supervision of pregnancy with other poor reproductive or obstetric history, third trimester: Secondary | ICD-10-CM

## 2017-01-10 DIAGNOSIS — O0990 Supervision of high risk pregnancy, unspecified, unspecified trimester: Secondary | ICD-10-CM

## 2017-01-10 DIAGNOSIS — Z3A38 38 weeks gestation of pregnancy: Secondary | ICD-10-CM

## 2017-01-10 NOTE — Progress Notes (Signed)
US 37+4 wks,cephalic,post pl gr 3,BPP 8/8,fhr 163 bpm,afi 10.9 cm,RI .62,.55

## 2017-01-14 ENCOUNTER — Ambulatory Visit (INDEPENDENT_AMBULATORY_CARE_PROVIDER_SITE_OTHER): Payer: Medicaid Other | Admitting: Women's Health

## 2017-01-14 ENCOUNTER — Encounter: Payer: Self-pay | Admitting: Obstetrics & Gynecology

## 2017-01-14 VITALS — BP 110/72 | HR 80 | Wt 240.4 lb

## 2017-01-14 DIAGNOSIS — O09293 Supervision of pregnancy with other poor reproductive or obstetric history, third trimester: Secondary | ICD-10-CM | POA: Diagnosis not present

## 2017-01-14 DIAGNOSIS — Z331 Pregnant state, incidental: Secondary | ICD-10-CM

## 2017-01-14 DIAGNOSIS — O99213 Obesity complicating pregnancy, third trimester: Secondary | ICD-10-CM

## 2017-01-14 DIAGNOSIS — Z1389 Encounter for screening for other disorder: Secondary | ICD-10-CM | POA: Diagnosis not present

## 2017-01-14 DIAGNOSIS — Z3A38 38 weeks gestation of pregnancy: Secondary | ICD-10-CM

## 2017-01-14 DIAGNOSIS — O0993 Supervision of high risk pregnancy, unspecified, third trimester: Secondary | ICD-10-CM | POA: Diagnosis not present

## 2017-01-14 LAB — POCT URINALYSIS DIPSTICK
GLUCOSE UA: NEGATIVE
Ketones, UA: NEGATIVE
LEUKOCYTES UA: NEGATIVE
NITRITE UA: NEGATIVE
Protein, UA: NEGATIVE
RBC UA: NEGATIVE

## 2017-01-14 NOTE — Patient Instructions (Signed)
Your induction is scheduled for 2/27 @ 11:45pm. Go to [facility] hospital, Maternity Admissions Unit (Emergency) entrance and let them know you are there to be induced. They will send someone from Labor & Delivery to come get you.    Call the office [phone]) or go to [facility] if:  You begin to have strong, frequent contractions  Your water breaks.  Sometimes it is a big gush of fluid, sometimes it is just a trickle that keeps getting your panties wet or running down your legs  You have vaginal bleeding.  It is normal to have a small amount of spotting if your cervix was checked.   You don't feel your baby moving like normal.  If you don't, get you something to eat and drink and lay down and focus on feeling your baby move.  You should feel at least 10 movements in 2 hours.  If you don't, you should call the office or go to [facility].     Call the office [phone]) or go to [facility] hospital for these signs of pre-eclampsia:  Severe headache that does not go away with Tylenol  Visual changes- seeing spots, double, blurred vision  Pain under your right breast or upper abdomen that does not go away with Tums or heartburn medicine  Nausea and/or vomiting  Severe swelling in your hands, feet, and face      Braxton Hicks Contractions Contractions of the uterus can occur throughout pregnancy. Contractions are not always a sign that you are in labor.  WHAT ARE BRAXTON HICKS CONTRACTIONS?  Contractions that occur before labor are called Braxton Hicks contractions, or false labor. Toward the end of pregnancy (32-34 weeks), these contractions can develop more often and may become more forceful. This is not true labor because these contractions do not result in opening (dilatation) and thinning of the cervix. They are sometimes difficult to tell apart from true labor because these contractions can be forceful and people have different pain tolerances. You should not feel embarrassed if  you go to the hospital with false labor. Sometimes, the only way to tell if you are in true labor is for your health care provider to look for changes in the cervix. If there are no prenatal problems or other health problems associated with the pregnancy, it is completely safe to be sent home with false labor and await the onset of true labor. HOW CAN YOU TELL THE DIFFERENCE BETWEEN TRUE AND FALSE LABOR? False Labor   The contractions of false labor are usually shorter and not as hard as those of true labor.   The contractions are usually irregular.   The contractions are often felt in the front of the lower abdomen and in the groin.   The contractions may go away when you walk around or change positions while lying down.   The contractions get weaker and are shorter lasting as time goes on.   The contractions do not usually become progressively stronger, regular, and closer together as with true labor.  True Labor   Contractions in true labor last 30-70 seconds, become very regular, usually become more intense, and increase in frequency.   The contractions do not go away with walking.   The discomfort is usually felt in the top of the uterus and spreads to the lower abdomen and low back.   True labor can be determined by your health care provider with an exam. This will show that the cervix is dilating and getting thinner.  WHAT TO REMEMBER  Keep up with your usual exercises and follow other instructions given by your health care provider.   Take medicines as directed by your health care provider.   Keep your regular prenatal appointments.   Eat and drink lightly if you think you are going into labor.   If Braxton Hicks contractions are making you uncomfortable:   Change your position from lying down or resting to walking, or from walking to resting.   Sit and rest in a tub of warm water.   Drink 2-3 glasses of water. Dehydration may cause these  contractions.   Do slow and deep breathing several times an hour.  WHEN SHOULD I SEEK IMMEDIATE MEDICAL CARE? Seek immediate medical care if:  Your contractions become stronger, more regular, and closer together.   You have fluid leaking or gushing from your vagina.   You have a fever.   You pass blood-tinged mucus.   You have vaginal bleeding.   You have continuous abdominal pain.   You have low back pain that you never had before.   You feel your baby's head pushing down and causing pelvic pressure.   Your baby is not moving as much as it used to.  This information is not intended to replace advice given to you by your health care provider. Make sure you discuss any questions you have with your health care provider. Document Released: 11/11/2005 Document Revised: 03/04/2016 Document Reviewed: 08/23/2013 Elsevier Interactive Patient Education  2017 [employer]

## 2017-01-14 NOTE — Progress Notes (Signed)
High Risk Pregnancy Diagnosis(es): h/o IUFD x 2 G5P0220 6311w1d Estimated Date of Delivery: 01/27/17 BP 110/72   Pulse 80   Wt 240 lb 6.4 oz (109 kg)   LMP  (LMP Unknown)   BMI 43.97 kg/m  1st bp 140/70- states she was upset b/c she had to wait for awhile in waiting room  Urinalysis: Negative HPI:  Doing well. Headache yesterday, this is normal for her. Denies visual changes, ruq/epigastric pain, n/v, etc.  BP, weight, and urine reviewed.  Reports good fm. Denies regular uc's, lof, vb, uti s/s. No complaints.  Fundal Height:  38 Fetal Heart rate:  130, reactive NST Edema:  Trace DTRs 2+, no clonus  Reviewed labor s/s, fkc, pre-e s/s just in case All questions were answered Assessment: 10911w1d h/o IUFD x 2 Medication(s) Plans:  Baby asa Treatment Plan:  2x/wk testing  sono alt w/ nst, IOL @ 39wks- scheduled today for 2/27 @ 2345 (nothing available 2/26 @ 39.0wks) Follow up in 3d for high-risk OB appt and bpp/dopp u/s

## 2017-01-15 ENCOUNTER — Telehealth (HOSPITAL_COMMUNITY): Payer: Self-pay | Admitting: *Deleted

## 2017-01-15 ENCOUNTER — Encounter (HOSPITAL_COMMUNITY): Payer: Self-pay | Admitting: *Deleted

## 2017-01-15 NOTE — Telephone Encounter (Signed)
Preadmission screen  

## 2017-01-17 ENCOUNTER — Ambulatory Visit (INDEPENDENT_AMBULATORY_CARE_PROVIDER_SITE_OTHER): Payer: Medicaid Other

## 2017-01-17 ENCOUNTER — Encounter: Payer: Self-pay | Admitting: Obstetrics & Gynecology

## 2017-01-17 ENCOUNTER — Ambulatory Visit (INDEPENDENT_AMBULATORY_CARE_PROVIDER_SITE_OTHER): Payer: Medicaid Other | Admitting: Obstetrics & Gynecology

## 2017-01-17 VITALS — BP 110/70 | HR 76 | Wt 241.0 lb

## 2017-01-17 DIAGNOSIS — Z1389 Encounter for screening for other disorder: Secondary | ICD-10-CM

## 2017-01-17 DIAGNOSIS — O0990 Supervision of high risk pregnancy, unspecified, unspecified trimester: Secondary | ICD-10-CM

## 2017-01-17 DIAGNOSIS — O0993 Supervision of high risk pregnancy, unspecified, third trimester: Secondary | ICD-10-CM | POA: Diagnosis not present

## 2017-01-17 DIAGNOSIS — Z3A39 39 weeks gestation of pregnancy: Secondary | ICD-10-CM

## 2017-01-17 DIAGNOSIS — Z331 Pregnant state, incidental: Secondary | ICD-10-CM | POA: Diagnosis not present

## 2017-01-17 DIAGNOSIS — O09293 Supervision of pregnancy with other poor reproductive or obstetric history, third trimester: Secondary | ICD-10-CM

## 2017-01-17 DIAGNOSIS — O99213 Obesity complicating pregnancy, third trimester: Secondary | ICD-10-CM | POA: Diagnosis not present

## 2017-01-17 LAB — POCT URINALYSIS DIPSTICK
Blood, UA: NEGATIVE
Glucose, UA: NEGATIVE
KETONES UA: NEGATIVE
Leukocytes, UA: NEGATIVE
Nitrite, UA: NEGATIVE
PROTEIN UA: NEGATIVE

## 2017-01-17 NOTE — Progress Notes (Signed)
Fetal Surveillance Testing today:  BPP 8/8 with good Doppler flow ratios   High Risk Pregnancy Diagnosis(es):   Hx of fetal demise x 2  G5P0220 6723w4d Estimated Date of Delivery: 01/27/17  Blood pressure 110/70, pulse 76, weight 241 lb (109.3 kg), not currently breastfeeding.  Urinalysis: Negative   HPI: The patient is being seen today for ongoing management of as above. Today she reports no complaints   BP weight and urine results all reviewed and noted. Patient reports good fetal movement, denies any bleeding and no rupture of membranes symptoms or regular contractions.  Fundal Height:  40 Fetal Heart rate:  136 Edema:  none  Patient is without complaints other than noted in her HPI. All questions were answered.  All lab and sonogram results have been reviewed. Comments:    Assessment:  1.  Pregnancy at 6923w4d,  Estimated Date of Delivery: 01/27/17 :                          2.  Hx of previous fetal demise x 2                        3.    Medication(s) Plans:  Baby ASA  Treatment Plan:  Induction of labor at 39 weeks scheduled  Return in about 6 weeks (around 02/28/2017) for post partum visit. for appointment for high risk OB care  No orders of the defined types were placed in this encounter.  Orders Placed This Encounter  Procedures  . POCT urinalysis dipstick

## 2017-01-17 NOTE — Progress Notes (Signed)
US 38+4 wks,cephalic,post pl gr 3,afi 14.6 cm,fhr 132 bpm,BPP 8/8,RI .61,.63

## 2017-01-20 ENCOUNTER — Other Ambulatory Visit: Payer: Medicaid Other | Admitting: Obstetrics & Gynecology

## 2017-01-22 ENCOUNTER — Inpatient Hospital Stay (HOSPITAL_COMMUNITY)
Admission: RE | Admit: 2017-01-22 | Discharge: 2017-01-25 | DRG: 765 | Disposition: A | Discharge status: Home / Self Care | Payer: Medicaid Other | Source: Ambulatory Visit | Attending: Obstetrics & Gynecology | Admitting: Obstetrics & Gynecology

## 2017-01-22 DIAGNOSIS — O99214 Obesity complicating childbirth: Secondary | ICD-10-CM | POA: Diagnosis present

## 2017-01-22 DIAGNOSIS — Z87891 Personal history of nicotine dependence: Secondary | ICD-10-CM

## 2017-01-22 DIAGNOSIS — G894 Chronic pain syndrome: Secondary | ICD-10-CM | POA: Diagnosis present

## 2017-01-22 DIAGNOSIS — Z3493 Encounter for supervision of normal pregnancy, unspecified, third trimester: Secondary | ICD-10-CM | POA: Diagnosis present

## 2017-01-22 DIAGNOSIS — O99344 Other mental disorders complicating childbirth: Secondary | ICD-10-CM | POA: Diagnosis present

## 2017-01-22 DIAGNOSIS — F1220 Cannabis dependence, uncomplicated: Secondary | ICD-10-CM | POA: Diagnosis not present

## 2017-01-22 DIAGNOSIS — O0990 Supervision of high risk pregnancy, unspecified, unspecified trimester: Secondary | ICD-10-CM

## 2017-01-22 DIAGNOSIS — Z7982 Long term (current) use of aspirin: Secondary | ICD-10-CM

## 2017-01-22 DIAGNOSIS — O324XX0 Maternal care for high head at term, not applicable or unspecified: Secondary | ICD-10-CM | POA: Diagnosis not present

## 2017-01-22 DIAGNOSIS — Z8249 Family history of ischemic heart disease and other diseases of the circulatory system: Secondary | ICD-10-CM

## 2017-01-22 DIAGNOSIS — Z823 Family history of stroke: Secondary | ICD-10-CM

## 2017-01-22 DIAGNOSIS — O9952 Diseases of the respiratory system complicating childbirth: Secondary | ICD-10-CM | POA: Diagnosis present

## 2017-01-22 DIAGNOSIS — J45909 Unspecified asthma, uncomplicated: Secondary | ICD-10-CM | POA: Diagnosis present

## 2017-01-22 DIAGNOSIS — O99324 Drug use complicating childbirth: Secondary | ICD-10-CM | POA: Diagnosis present

## 2017-01-22 DIAGNOSIS — Z3A39 39 weeks gestation of pregnancy: Secondary | ICD-10-CM

## 2017-01-22 DIAGNOSIS — F4310 Post-traumatic stress disorder, unspecified: Secondary | ICD-10-CM | POA: Diagnosis present

## 2017-01-22 DIAGNOSIS — F1290 Cannabis use, unspecified, uncomplicated: Secondary | ICD-10-CM | POA: Diagnosis present

## 2017-01-22 LAB — CBC
HEMATOCRIT: 34 % — AB (ref 36.0–46.0)
Hemoglobin: 11.8 g/dL — ABNORMAL LOW (ref 12.0–15.0)
MCH: 30.1 pg (ref 26.0–34.0)
MCHC: 34.7 g/dL (ref 30.0–36.0)
MCV: 86.7 fL (ref 78.0–100.0)
Platelets: 181 10*3/uL (ref 150–400)
RBC: 3.92 MIL/uL (ref 3.87–5.11)
RDW: 13.5 % (ref 11.5–15.5)
WBC: 14.8 10*3/uL — ABNORMAL HIGH (ref 4.0–10.5)

## 2017-01-22 LAB — TYPE AND SCREEN
ABO/RH(D): A POS
ANTIBODY SCREEN: NEGATIVE

## 2017-01-22 LAB — MRSA PCR SCREENING: MRSA BY PCR: NEGATIVE

## 2017-01-22 MED ORDER — FENTANYL CITRATE (PF) 100 MCG/2ML IJ SOLN: 100.0000 ug | INTRAMUSCULAR | Status: DC | PRN

## 2017-01-22 MED ORDER — MISOPROSTOL 50MCG HALF TABLET
50.0000 ug | ORAL_TABLET | ORAL | Status: DC
  Administered 2017-01-22 (×2): 50 ug via ORAL
  Filled 2017-01-22 (×2): qty 0.5

## 2017-01-22 MED ORDER — TERBUTALINE SULFATE 1 MG/ML IJ SOLN: 0.2500 mg | Freq: Once | INTRAMUSCULAR | Status: DC | PRN

## 2017-01-22 MED ORDER — ONDANSETRON HCL 4 MG/2ML IJ SOLN: 4.0000 mg | Freq: Four times a day (QID) | INTRAMUSCULAR | Status: DC | PRN

## 2017-01-22 MED ORDER — MISOPROSTOL 25 MCG QUARTER TABLET
25.0000 ug | ORAL_TABLET | ORAL | Status: DC
  Administered 2017-01-22 – 2017-01-23 (×2): 25 ug via VAGINAL
  Filled 2017-01-22 (×2): qty 1
  Filled 2017-01-22: qty 0.25
  Filled 2017-01-22 (×2): qty 1
  Filled 2017-01-22: qty 0.25

## 2017-01-22 MED ORDER — OXYCODONE-ACETAMINOPHEN 5-325 MG PO TABS: 1.0000 | ORAL_TABLET | ORAL | Status: DC | PRN

## 2017-01-22 MED ORDER — LACTATED RINGERS IV SOLN
INTRAVENOUS | Status: DC
  Administered 2017-01-22 – 2017-01-23 (×5): via INTRAVENOUS

## 2017-01-22 MED ORDER — OXYCODONE-ACETAMINOPHEN 5-325 MG PO TABS: 2.0000 | ORAL_TABLET | ORAL | Status: DC | PRN

## 2017-01-22 MED ORDER — LACTATED RINGERS IV SOLN: 500.0000 mL | INTRAVENOUS | Status: DC | PRN

## 2017-01-22 MED ORDER — ZOLPIDEM TARTRATE 5 MG PO TABS
5.0000 mg | ORAL_TABLET | Freq: Every evening | ORAL | Status: DC | PRN
  Administered 2017-01-23: 5 mg via ORAL
  Filled 2017-01-22: qty 1

## 2017-01-22 MED ORDER — OXYTOCIN 40 UNITS IN LACTATED RINGERS INFUSION - SIMPLE MED
1.0000 m[IU]/min | INTRAVENOUS | Status: DC
  Administered 2017-01-22 – 2017-01-23 (×2): 2 m[IU]/min via INTRAVENOUS
  Filled 2017-01-22: qty 1000

## 2017-01-22 MED ORDER — LIDOCAINE HCL (PF) 1 % IJ SOLN: 30.0000 mL | INTRAMUSCULAR | Status: DC | PRN

## 2017-01-22 MED ORDER — NALBUPHINE HCL 10 MG/ML IJ SOLN: 5.0000 mg | INTRAMUSCULAR | Status: DC | PRN

## 2017-01-22 MED ORDER — OXYTOCIN BOLUS FROM INFUSION: 500.0000 mL | Freq: Once | INTRAVENOUS | Status: DC

## 2017-01-22 MED ORDER — ACETAMINOPHEN 325 MG PO TABS
650.0000 mg | ORAL_TABLET | ORAL | Status: DC | PRN
  Administered 2017-01-23: 650 mg via ORAL
  Filled 2017-01-22: qty 2

## 2017-01-22 MED ORDER — OXYTOCIN 40 UNITS IN LACTATED RINGERS INFUSION - SIMPLE MED: 2.5000 [IU]/h | INTRAVENOUS | Status: DC

## 2017-01-22 MED ORDER — SOD CITRATE-CITRIC ACID 500-334 MG/5ML PO SOLN: 30.0000 mL | ORAL | Status: DC | PRN

## 2017-01-22 NOTE — Progress Notes (Signed)
Patient ID: Jade Taylor, female   DOB: May 12, 1991, 26 y.o.   MRN: [id number] Jade HumblesGabrielle E Kingsford is a 26 y.o. [id number] at 3937w2d admitted for induction of labor due to h/o IUFD x 2.  Subjective: Doing well, feeling crampy  Objective: BP 124/82   Pulse 72   Temp 98.5 F (36.9 C) (Oral)   Resp 18   LMP  (LMP Unknown)  No intake/output data recorded.  FHT:  FHR: 135 bpm, variability: moderate,  accelerations:  Present,  decelerations:  Absent UC:   irregular  SVE:   Dilation: 3 Effacement (%): 50 Station: -3 Exam by:: kbooker, cnm   Labs: Lab Results  Component Value Date   WBC 14.8 (H) 01/22/2017   HGB 11.8 (L) 01/22/2017   HCT 34.0 (L) 01/22/2017   MCV 86.7 01/22/2017   PLT 181 01/22/2017    Assessment / Plan: IOL d/t h/o IUFD x 2, s/p cytotec x 2, last dose @ 0600, will plan for pitocin @ 1000 if needed  Labor: cervical ripening Fetal Wellbeing:  Category I Pain Control:  n/a Pre-eclampsia: n/a I/D:  n/a Anticipated MOD:  NSVD  Marge DuncansBooker, Coleston Dirosa Randall CNM, WHNP-BC 01/22/2017, 7:16 AM

## 2017-01-22 NOTE — Anesthesia Pain Management Evaluation Note (Signed)
  CRNA Pain Management Visit Note  Patient: Jade GauzeGabrielle E Cilento, 26 y.o., female  "Hello I am a member of the anesthesia team at [facility]. We have an anesthesia team available at all times to provide care throughout the hospital, including epidural management and anesthesia for C-section. I don't know your plan for the delivery whether it a natural birth, water birth, IV sedation, nitrous supplementation, doula or epidural, but we want to meet your pain goals."   1.Was your pain managed to your expectations on prior hospitalizations?   No prior hospitalizations  2.What is your expectation for pain management during this hospitalization?     Epidural  3.How can we help you reach that goal? epidural  Record the patient's initial score and the patient's pain goal.   Pain: 2  Pain Goal: 4 [facility] wants you to be able to say your pain was always managed very well.  Madison HickmanGREGORY,Lura Falor 01/22/2017

## 2017-01-22 NOTE — Progress Notes (Signed)
Patient seen. Doing well. No complaints. Cervix is unchanged after 8 hours on pitocin with elevation to 6416mu/min. Plan to transition back to cytotec at this time. Will start cytotec 25mcg q4 hrs PRN.  Ernestina PennaNicholas Kimiye Strathman, MD 01/22/17 [phone]

## 2017-01-22 NOTE — Progress Notes (Signed)
Cherrie GauzeGabrielle E Hovanec is a 26 y.o. [id number] at 239w2d  Subjective: Patient doing well. Pain is controlled. New nw complaints.  Objective: BP 131/69   Pulse 82   Temp 98.3 F (36.8 C) (Oral)   Resp 17   LMP  (LMP Unknown)  No intake/output data recorded. No intake/output data recorded.  FHT:  FHR: 150 bpm, variability: moderate,  accelerations:  Present,  decelerations:  Absent UC:   irregular, every 3-5 minutes SVE:   Dilation: 3 Effacement (%): 50 Station: -3 Exam by:: Devra DoppAmber Knox, RN   Labs: Lab Results  Component Value Date   WBC 14.8 (H) 01/22/2017   HGB 11.8 (L) 01/22/2017   HCT 34.0 (L) 01/22/2017   MCV 86.7 01/22/2017   PLT 181 01/22/2017    Assessment / Plan: Induction of labor d/t h/o IUFD x 2, s/p Cytotec x2, initiating and will titrate pitocin to achieve adequate labor  Labor: Progressing normally Preeclampsia:  None Fetal Wellbeing:  Category I Pain Control:  Labor support without medications I/D:  n/a Anticipated MOD:  NSVD  Wendee Beaversavid J McMullen, DO , PGY-1 01/22/2017, 11:36 AM

## 2017-01-22 NOTE — Progress Notes (Signed)
Patient ID: Jade HumblesGabrielle E Taylor, female   DOB: 03/18/1991, 26 y.o.   MRN: [id number]  Visited room- pt appears to be sleeping- not disturbed; last cytotec @ 2000 VSS, afeb FHR 150s, +accels, no decels No ctx currently Cx deferred  IUP@term  Hx IUFD x 2 Cx unfavorable  Plan to continue cytotec during the night and Pit again the morning Anticipate SVD  Cam HaiSHAW, Aashika Carta CNM 01/22/2017 11:26 PM

## 2017-01-22 NOTE — H&P (Signed)
Jade Taylor is a 26 y.o. [id number] female at 5112w2d by 10wk u/s, presenting for IOL d/t h/o IUFD x 2 at 4424 & 28wks.   Reports active fetal movement, contractions: none, vaginal bleeding: none, membranes: intact. [facility]   Most recent u/s 2/23 @ 38.4wks: afi 14.6cm, bpp 8/8, dopplers .61 & .63 Last efw 3050g/57% at 36.4wks   This pregnancy complicated by: +THC x 2  Prenatal History/Complications:  H/o IUFD x 2, 1st at 24.2wks (20wk fetal size) w/ true know, 2nd at 28.4wks etiology unclear, small 1cm placental bleed on pathology w/ neg [facility]  Past Medical History: Past Medical History:  Diagnosis Date  . Asthma   . Chronic pain syndrome   . 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 Hilda LiasKeeling  . PTSD (post-traumatic stress disorder)     Past Surgical History: Past Surgical History:  Procedure Laterality Date  . CARPAL TUNNEL RELEASE    . WISDOM TOOTH EXTRACTION      Obstetrical History: OB History    Gravida Para Term Preterm AB Living   5 2 0 2 2 0   SAB TAB Ectopic Multiple Live Births   1 1            Social History: Social History   Social History  . Marital status: Single    Spouse name: N/A  . Number of children: N/A  . Years of education: N/A   Social History Main Topics  . Smoking status: Former Smoker    Packs/day: 0.50    Years: 0.50    Types: Cigarettes    Quit date: 07/02/2016  . Smokeless tobacco: Never Used  . Alcohol use No     Comment: ocassilly  . Drug use: No     Comment: Prior pregnancy  . Sexual activity: Yes    Birth control/ protection: None   Other Topics Concern  . Not on file   Social History Narrative  . No narrative on file    Family History: Family History  Problem Relation Age of Onset  . Bipolar disorder Mother   . Schizophrenia Mother   . Stroke Father   . Heart disease Maternal Grandmother   . Heart disease  Paternal Grandfather     Allergies: Allergies  Allergen Reactions  . Metronidazole Hives, Shortness Of Breath, Rash and Other (See Comments)    Burning sensation  . Penicillins Shortness Of Breath, Itching and Rash    Has patient had a PCN reaction causing immediate rash, facial/tongue/throat swelling, SOB or lightheadedness with hypotension: No Has patient had a PCN reaction causing severe rash involving mucus membranes or skin necrosis: No Has patient had a PCN reaction that required hospitalization Yes Has patient had a PCN reaction occurring within the last 10 years: No If all of the above answers are "NO", then may proceed with Cephalosporin use.   Marland Kitchen. Amoxicillin Hives    Prescriptions Prior to Admission  Medication Sig Dispense Refill Last Dose  . aspirin EC 81 MG tablet Take 81 mg by mouth daily.   Taking  . Prenatal Vit-Fe Fumarate-FA (PRENATAL MULTIVITAMIN) TABS tablet Take 1 tablet by mouth daily at 12 noon.   Taking    Review of Systems  Pertinent pos/neg as indicated in HPI  Temperature 97.9 F (36.6 C), temperature source Oral, resp. rate 18. General appearance: alert, cooperative and no distress Lungs: clear to auscultation  bilaterally Heart: regular rate and rhythm Abdomen: gravid, soft, non-tender Extremities: trace edema DTR's 2+  Fetal monitoring: FHR: 125 bpm, variability: moderate,  Accelerations: Present,  decelerations:  Absent Uterine activity: irregular Dilation: 1 Effacement (%): 50 Station: -3 Exam by:: KBooker very posterior Presentation: cephalic   Prenatal labs: ABO, Rh: A/Positive/-- (08/16 1616) Antibody: Negative (12/11 0858) Rubella: !Error!immune RPR: Non Reactive (12/11 0858)  HBsAg: Negative (08/16 1616)  HIV: Non Reactive (12/11 0858)  GBS: Negative (02/09 0000)   2 hr GTT normal: 88/120/92 Genetic screening:  Nt/it neg Anatomy US: normal female  Results for orders placed or performed during the hospital encounter of  01/22/17 (from the past 24 hour(s))  CBC   Collection Time: 01/22/17  1:00 AM  Result Value Ref Range   WBC 14.8 (H) 4.0 - 10.5 K/uL   RBC 3.92 3.87 - 5.11 MIL/uL   Hemoglobin 11.8 (L) 12.0 - 15.0 g/dL   HCT 16.1 (L) 09.6 - 04.5 %   MCV 86.7 78.0 - 100.0 fL   MCH 30.1 26.0 - 34.0 pg   MCHC 34.7 30.0 - 36.0 g/dL   RDW 40.9 81.1 - 91.4 %   Platelets 181 150 - 400 K/uL     Assessment:  [redacted]w[redacted]d SIUP  G5P0220  IOL for h/o IUFD x 2  Cat 1 FHR  GBS Negative (02/09 0000)  Plan:  Admit to BS  IV pain meds/epidural prn active labor  Cytotec q4hr po, plan cervical foley bulb  Anticipate NSVB   Plans to bottlefeed  Contraception: COCs  Circumcision: @ FT  Marge Duncans CNM, WHNP-BC 01/22/2017, 1:30 AM

## 2017-01-22 NOTE — Progress Notes (Signed)
Jade Taylor is a 26 y.o. [id number] at 8145w2d   Subjective: Doing well with increasing contractions. No new complaints.  Objective: BP (!) 101/53   Pulse 71   Temp 98.3 F (36.8 C) (Oral)   Resp 18   LMP  (LMP Unknown)  No intake/output data recorded. No intake/output data recorded.  FHT:  FHR: 130 bpm, variability: moderate,  accelerations:  Present,  decelerations:  Absent UC:   irregular, every 4-6 minutes SVE:   Dilation: 3 Effacement (%): 50 Station: -3 Exam by:: Express Scriptsmber Knox, RN   Labs: Lab Results  Component Value Date   WBC 14.8 (H) 01/22/2017   HGB 11.8 (L) 01/22/2017   HCT 34.0 (L) 01/22/2017   MCV 86.7 01/22/2017   PLT 181 01/22/2017    Assessment / Plan: Induction of labor due to h/o IUFD x2, s/p Cytotec x2, initiating and will titrate pitocin to achieve adequate labor  Labor: Progressing normally Preeclampsia:  None Fetal Wellbeing:  Category I Pain Control:  Labor support without medications I/D:  n/a Anticipated MOD:  NSVD  Wendee Beaversavid J Nijae Doyel, DO, PGY-1 01/22/2017, 3:22 PM

## 2017-01-23 ENCOUNTER — Encounter (HOSPITAL_COMMUNITY)
Admission: RE | Disposition: A | Discharge status: Home / Self Care | Payer: Self-pay | Source: Ambulatory Visit | Attending: Obstetrics & Gynecology

## 2017-01-23 ENCOUNTER — Inpatient Hospital Stay (HOSPITAL_COMMUNITY): Payer: Medicaid Other | Admitting: Anesthesiology

## 2017-01-23 ENCOUNTER — Encounter (HOSPITAL_COMMUNITY): Payer: Self-pay

## 2017-01-23 DIAGNOSIS — O324XX0 Maternal care for high head at term, not applicable or unspecified: Secondary | ICD-10-CM

## 2017-01-23 DIAGNOSIS — F1220 Cannabis dependence, uncomplicated: Secondary | ICD-10-CM

## 2017-01-23 DIAGNOSIS — Z3A39 39 weeks gestation of pregnancy: Secondary | ICD-10-CM

## 2017-01-23 DIAGNOSIS — O99324 Drug use complicating childbirth: Secondary | ICD-10-CM

## 2017-01-23 LAB — RPR: RPR: NONREACTIVE

## 2017-01-23 SURGERY — Surgical Case
Anesthesia: Epidural | Wound class: Clean Contaminated

## 2017-01-23 MED ORDER — MORPHINE SULFATE (PF) 0.5 MG/ML IJ SOLN
INTRAMUSCULAR | Status: AC
  Filled 2017-01-23: qty 10

## 2017-01-23 MED ORDER — ACETAMINOPHEN 500 MG PO TABS
1000.0000 mg | ORAL_TABLET | Freq: Four times a day (QID) | ORAL | Status: AC
  Administered 2017-01-23 – 2017-01-24 (×3): 1000 mg via ORAL
  Filled 2017-01-23 (×3): qty 2

## 2017-01-23 MED ORDER — SODIUM CHLORIDE 0.9 % IR SOLN
Status: DC | PRN
  Administered 2017-01-23: 1000 mL

## 2017-01-23 MED ORDER — HYDROMORPHONE HCL 1 MG/ML IJ SOLN
INTRAMUSCULAR | Status: DC | PRN
  Administered 2017-01-23: 1 mg via INTRAVENOUS

## 2017-01-23 MED ORDER — ACETAMINOPHEN 325 MG PO TABS: 650.0000 mg | ORAL_TABLET | ORAL | Status: DC | PRN

## 2017-01-23 MED ORDER — EPHEDRINE 5 MG/ML INJ: 10.0000 mg | INTRAVENOUS | Status: DC | PRN

## 2017-01-23 MED ORDER — FENTANYL CITRATE (PF) 100 MCG/2ML IJ SOLN: 25.0000 ug | INTRAMUSCULAR | Status: DC | PRN

## 2017-01-23 MED ORDER — LACTATED RINGERS IV SOLN
INTRAVENOUS | Status: DC
  Administered 2017-01-23 (×2): via INTRAVENOUS

## 2017-01-23 MED ORDER — PRENATAL MULTIVITAMIN CH
1.0000 | ORAL_TABLET | Freq: Every day | ORAL | Status: DC
  Administered 2017-01-24: 1 via ORAL
  Filled 2017-01-23: qty 1

## 2017-01-23 MED ORDER — DIPHENHYDRAMINE HCL 50 MG/ML IJ SOLN: 12.5000 mg | INTRAMUSCULAR | Status: DC | PRN

## 2017-01-23 MED ORDER — PHENYLEPHRINE 40 MCG/ML (10ML) SYRINGE FOR IV PUSH (FOR BLOOD PRESSURE SUPPORT): 80.0000 ug | PREFILLED_SYRINGE | INTRAVENOUS | Status: DC | PRN

## 2017-01-23 MED ORDER — COCONUT OIL OIL: 1.0000 "application " | TOPICAL_OIL | Status: DC | PRN

## 2017-01-23 MED ORDER — SIMETHICONE 80 MG PO CHEW
80.0000 mg | CHEWABLE_TABLET | ORAL | Status: DC
  Administered 2017-01-23 – 2017-01-25 (×2): 80 mg via ORAL
  Filled 2017-01-23 (×2): qty 1

## 2017-01-23 MED ORDER — PROMETHAZINE HCL 25 MG/ML IJ SOLN: 6.2500 mg | INTRAMUSCULAR | Status: DC | PRN

## 2017-01-23 MED ORDER — METHYLERGONOVINE MALEATE 0.2 MG/ML IJ SOLN
INTRAMUSCULAR | Status: AC
  Filled 2017-01-23: qty 1

## 2017-01-23 MED ORDER — MEPERIDINE HCL 25 MG/ML IJ SOLN: 6.2500 mg | INTRAMUSCULAR | Status: DC | PRN

## 2017-01-23 MED ORDER — SCOPOLAMINE 1 MG/3DAYS TD PT72
MEDICATED_PATCH | TRANSDERMAL | Status: AC
  Filled 2017-01-23: qty 1

## 2017-01-23 MED ORDER — OXYTOCIN 40 UNITS IN LACTATED RINGERS INFUSION - SIMPLE MED: 2.5000 [IU]/h | INTRAVENOUS | Status: AC

## 2017-01-23 MED ORDER — SIMETHICONE 80 MG PO CHEW
80.0000 mg | CHEWABLE_TABLET | Freq: Three times a day (TID) | ORAL | Status: DC
  Administered 2017-01-24 – 2017-01-25 (×4): 80 mg via ORAL
  Filled 2017-01-23 (×4): qty 1

## 2017-01-23 MED ORDER — BUPIVACAINE HCL (PF) 0.5 % IJ SOLN
INTRAMUSCULAR | Status: AC
  Filled 2017-01-23: qty 30

## 2017-01-23 MED ORDER — ONDANSETRON HCL 4 MG/2ML IJ SOLN: 4.0000 mg | Freq: Three times a day (TID) | INTRAMUSCULAR | Status: DC | PRN

## 2017-01-23 MED ORDER — OXYTOCIN 10 UNIT/ML IJ SOLN
INTRAMUSCULAR | Status: AC
  Filled 2017-01-23: qty 4

## 2017-01-23 MED ORDER — METHYLERGONOVINE MALEATE 0.2 MG/ML IJ SOLN
INTRAMUSCULAR | Status: DC | PRN
  Administered 2017-01-23: 0.2 mg via INTRAMUSCULAR

## 2017-01-23 MED ORDER — NALBUPHINE HCL 10 MG/ML IJ SOLN
5.0000 mg | INTRAMUSCULAR | Status: DC | PRN
  Administered 2017-01-23: 5 mg via INTRAVENOUS
  Filled 2017-01-23: qty 1

## 2017-01-23 MED ORDER — LIDOCAINE-EPINEPHRINE (PF) 2 %-1:200000 IJ SOLN: INTRAMUSCULAR | Status: DC | PRN

## 2017-01-23 MED ORDER — SCOPOLAMINE 1 MG/3DAYS TD PT72SCOPOLAMINE 1 MG/3DAYS
MEDICATED_PATCH | TRANSDERMAL | Status: DC | PRN
Start: 2017-01-23 — End: 2017-01-23
  Administered 2017-01-23: 1 via TRANSDERMAL

## 2017-01-23 MED ORDER — NALOXONE HCL 0.4 MG/ML IJ SOLN: 0.4000 mg | INTRAMUSCULAR | Status: DC | PRN

## 2017-01-23 MED ORDER — NALOXONE HCL 2 MG/2ML IJ SOSY: 1.0000 ug/kg/h | PREFILLED_SYRINGE | INTRAVENOUS | Status: DC | PRN

## 2017-01-23 MED ORDER — NALBUPHINE HCL 10 MG/ML IJ SOLN: 5.0000 mg | Freq: Once | INTRAMUSCULAR | Status: DC | PRN

## 2017-01-23 MED ORDER — KETOROLAC TROMETHAMINE 30 MG/ML IJ SOLN
30.0000 mg | Freq: Four times a day (QID) | INTRAMUSCULAR | Status: AC | PRN
  Administered 2017-01-23: 30 mg via INTRAVENOUS

## 2017-01-23 MED ORDER — LACTATED RINGERS IV SOLN: 500.0000 mL | Freq: Once | INTRAVENOUS | Status: DC

## 2017-01-23 MED ORDER — MENTHOL 3 MG MT LOZG: 1.0000 | LOZENGE | OROMUCOSAL | Status: DC | PRN

## 2017-01-23 MED ORDER — PHENYLEPHRINE 40 MCG/ML (10ML) SYRINGE FOR IV PUSH (FOR BLOOD PRESSURE SUPPORT)
PREFILLED_SYRINGE | INTRAVENOUS | Status: AC
  Filled 2017-01-23: qty 20

## 2017-01-23 MED ORDER — LIDOCAINE-EPINEPHRINE (PF) 2 %-1:200000 IJ SOLN
INTRAMUSCULAR | Status: AC
  Filled 2017-01-23: qty 20

## 2017-01-23 MED ORDER — HYDROMORPHONE HCL 1 MG/ML IJ SOLN
INTRAMUSCULAR | Status: AC
  Filled 2017-01-23: qty 1

## 2017-01-23 MED ORDER — DEXAMETHASONE SODIUM PHOSPHATE 4 MG/ML IJ SOLN
INTRAMUSCULAR | Status: DC | PRN
  Administered 2017-01-23: 4 mg via INTRAVENOUS

## 2017-01-23 MED ORDER — IBUPROFEN 600 MG PO TABS
600.0000 mg | ORAL_TABLET | Freq: Four times a day (QID) | ORAL | Status: DC
  Administered 2017-01-24 – 2017-01-25 (×6): 600 mg via ORAL
  Filled 2017-01-23 (×6): qty 1

## 2017-01-23 MED ORDER — SIMETHICONE 80 MG PO CHEW: 80.0000 mg | CHEWABLE_TABLET | ORAL | Status: DC | PRN

## 2017-01-23 MED ORDER — MORPHINE SULFATE (PF) 0.5 MG/ML IJ SOLN
INTRAMUSCULAR | Status: DC | PRN
  Administered 2017-01-23: 4 mg via EPIDURAL
  Administered 2017-01-23: 1 mg via INTRAVENOUS

## 2017-01-23 MED ORDER — DIPHENHYDRAMINE HCL 25 MG PO CAPS
25.0000 mg | ORAL_CAPSULE | Freq: Four times a day (QID) | ORAL | Status: DC | PRN
  Filled 2017-01-23 (×2): qty 1

## 2017-01-23 MED ORDER — OXYCODONE HCL 5 MG PO TABS: 5.0000 mg | ORAL_TABLET | ORAL | Status: DC | PRN

## 2017-01-23 MED ORDER — KETOROLAC TROMETHAMINE 30 MG/ML IJ SOLN
INTRAMUSCULAR | Status: AC
  Filled 2017-01-23: qty 1

## 2017-01-23 MED ORDER — SODIUM CHLORIDE 0.9% FLUSH: 3.0000 mL | INTRAVENOUS | Status: DC | PRN

## 2017-01-23 MED ORDER — DIPHENHYDRAMINE HCL 25 MG PO CAPS
25.0000 mg | ORAL_CAPSULE | ORAL | Status: DC | PRN
  Administered 2017-01-24 (×2): 25 mg via ORAL

## 2017-01-23 MED ORDER — WITCH HAZEL-GLYCERIN EX PADS: 1.0000 "application " | MEDICATED_PAD | CUTANEOUS | Status: DC | PRN

## 2017-01-23 MED ORDER — SENNOSIDES-DOCUSATE SODIUM 8.6-50 MG PO TABS
2.0000 | ORAL_TABLET | ORAL | Status: DC
  Administered 2017-01-23 – 2017-01-25 (×2): 2 via ORAL
  Filled 2017-01-23 (×2): qty 2

## 2017-01-23 MED ORDER — LACTATED RINGERS IV SOLN
500.0000 mL | Freq: Once | INTRAVENOUS | Status: AC
  Administered 2017-01-23: 500 mL via INTRAVENOUS

## 2017-01-23 MED ORDER — DIBUCAINE 1 % RE OINT: 1.0000 "application " | TOPICAL_OINTMENT | RECTAL | Status: DC | PRN

## 2017-01-23 MED ORDER — KETOROLAC TROMETHAMINE 30 MG/ML IJ SOLN: 30.0000 mg | Freq: Four times a day (QID) | INTRAMUSCULAR | Status: AC | PRN

## 2017-01-23 MED ORDER — ONDANSETRON HCL 4 MG/2ML IJ SOLN
INTRAMUSCULAR | Status: DC | PRN
  Administered 2017-01-23: 4 mg via INTRAVENOUS

## 2017-01-23 MED ORDER — NALBUPHINE HCL 10 MG/ML IJ SOLN: 5.0000 mg | INTRAMUSCULAR | Status: DC | PRN

## 2017-01-23 MED ORDER — LIDOCAINE HCL (PF) 1 % IJ SOLN
INTRAMUSCULAR | Status: DC | PRN
  Administered 2017-01-23 (×2): 5 mL via EPIDURAL

## 2017-01-23 MED ORDER — ONDANSETRON HCL 4 MG/2ML IJ SOLN
INTRAMUSCULAR | Status: AC
  Filled 2017-01-23: qty 2

## 2017-01-23 MED ORDER — BUPIVACAINE HCL (PF) 0.5 % IJ SOLN
INTRAMUSCULAR | Status: DC | PRN
  Administered 2017-01-23: 30 mL

## 2017-01-23 MED ORDER — LACTATED RINGERS IV SOLN: INTRAVENOUS | Status: DC

## 2017-01-23 MED ORDER — SODIUM BICARBONATE 8.4 % IV SOLN
INTRAVENOUS | Status: DC | PRN
  Administered 2017-01-23: 3 mL via EPIDURAL
  Administered 2017-01-23 (×2): 2 mL via EPIDURAL
  Administered 2017-01-23: 4 mL via EPIDURAL
  Administered 2017-01-23: 5 mL via EPIDURAL
  Administered 2017-01-23 (×2): 2 mL via EPIDURAL

## 2017-01-23 MED ORDER — ZOLPIDEM TARTRATE 5 MG PO TABS: 5.0000 mg | ORAL_TABLET | Freq: Every evening | ORAL | Status: DC | PRN

## 2017-01-23 MED ORDER — TETANUS-DIPHTH-ACELL PERTUSSIS 5-2.5-18.5 LF-MCG/0.5 IM SUSP: 0.5000 mL | Freq: Once | INTRAMUSCULAR | Status: DC

## 2017-01-23 MED ORDER — DEXAMETHASONE SODIUM PHOSPHATE 4 MG/ML IJ SOLN
INTRAMUSCULAR | Status: AC
  Filled 2017-01-23: qty 1

## 2017-01-23 MED ORDER — OXYTOCIN 10 UNIT/ML IJ SOLN
INTRAMUSCULAR | Status: DC | PRN
  Administered 2017-01-23: 40 [IU] via INTRAVENOUS

## 2017-01-23 MED ORDER — FENTANYL 2.5 MCG/ML BUPIVACAINE 1/10 % EPIDURAL INFUSION (WH - ANES)
INTRAMUSCULAR | Status: AC
  Filled 2017-01-23: qty 100

## 2017-01-23 MED ORDER — FENTANYL 2.5 MCG/ML BUPIVACAINE 1/10 % EPIDURAL INFUSION (WH - ANES)
14.0000 mL/h | INTRAMUSCULAR | Status: DC | PRN
  Administered 2017-01-23: 14 mL/h via EPIDURAL
  Filled 2017-01-23: qty 100

## 2017-01-23 MED ORDER — PHENYLEPHRINE HCL 10 MG/ML IJ SOLN
INTRAMUSCULAR | Status: DC | PRN
  Administered 2017-01-23: 80 ug via INTRAVENOUS

## 2017-01-23 MED ORDER — FENTANYL 2.5 MCG/ML BUPIVACAINE 1/10 % EPIDURAL INFUSION (WH - ANES)
14.0000 mL/h | INTRAMUSCULAR | Status: DC | PRN
  Administered 2017-01-23: 14 mL/h via EPIDURAL

## 2017-01-23 MED ORDER — OXYCODONE HCL 5 MG PO TABS: 10.0000 mg | ORAL_TABLET | ORAL | Status: DC | PRN

## 2017-01-23 MED ORDER — DEXTROSE 5 % IV SOLN
INTRAVENOUS | Status: AC
  Administered 2017-01-23: 119.75 mL via INTRAVENOUS
  Filled 2017-01-23: qty 13.75

## 2017-01-23 SURGICAL SUPPLY — 36 items
BENZOIN TINCTURE PRP APPL 2/3 (GAUZE/BANDAGES/DRESSINGS) ×3 IMPLANT
BLADE TIP J-PLASMA PRECISE LAP (MISCELLANEOUS) ×3 IMPLANT
CHLORAPREP W/TINT 26ML (MISCELLANEOUS) ×3 IMPLANT
CLAMP CORD UMBIL (MISCELLANEOUS) ×3 IMPLANT
CLOSURE WOUND 1/2 X4 (GAUZE/BANDAGES/DRESSINGS) ×1
CLOTH BEACON ORANGE TIMEOUT ST (SAFETY) ×3 IMPLANT
DRSG OPSITE POSTOP 4X10 (GAUZE/BANDAGES/DRESSINGS) ×3 IMPLANT
ELECT REM PT RETURN 9FT ADLT (ELECTROSURGICAL) ×3
ELECTRODE REM PT RTRN 9FT ADLT (ELECTROSURGICAL) ×1 IMPLANT
EXTRACTOR VACUUM KIWI (MISCELLANEOUS) IMPLANT
GLOVE BIO SURGEON STRL SZ7 (GLOVE) ×9 IMPLANT
GLOVE BIOGEL PI IND STRL 7.0 (GLOVE) ×2 IMPLANT
GLOVE BIOGEL PI INDICATOR 7.0 (GLOVE) ×4
GOWN STRL REUS W/TWL LRG LVL3 (GOWN DISPOSABLE) ×9 IMPLANT
GOWN STRL REUS W/TWL XL LVL3 (GOWN DISPOSABLE) ×3 IMPLANT
KIT ABG SYR 3ML LUER SLIP (SYRINGE) IMPLANT
NEEDLE HYPO 22GX1.5 SAFETY (NEEDLE) ×3 IMPLANT
NEEDLE HYPO 25X5/8 SAFETYGLIDE (NEEDLE) IMPLANT
NS IRRIG 1000ML POUR BTL (IV SOLUTION) ×3 IMPLANT
PACK C SECTION WH (CUSTOM PROCEDURE TRAY) ×3 IMPLANT
PAD OB MATERNITY 4.3X12.25 (PERSONAL CARE ITEMS) ×3 IMPLANT
PENCIL SMOKE EVAC W/HOLSTER (ELECTROSURGICAL) ×3 IMPLANT
RTRCTR C-SECT PINK 25CM LRG (MISCELLANEOUS) IMPLANT
SPONGE LAP 18X18 X RAY DECT (DISPOSABLE) ×6 IMPLANT
SPONGE SURGIFOAM ABS GEL 12-7 (HEMOSTASIS) IMPLANT
STRIP CLOSURE SKIN 1/2X4 (GAUZE/BANDAGES/DRESSINGS) ×2 IMPLANT
SUT PDS AB 0 CTX 60 (SUTURE) IMPLANT
SUT PLAIN 0 NONE (SUTURE) IMPLANT
SUT SILK 0 TIES 10X30 (SUTURE) IMPLANT
SUT VIC AB 0 CT1 36 (SUTURE) ×12 IMPLANT
SUT VIC AB 3-0 CT1 27 (SUTURE) ×2
SUT VIC AB 3-0 CT1 TAPERPNT 27 (SUTURE) ×1 IMPLANT
SUT VIC AB 4-0 KS 27 (SUTURE) IMPLANT
SYR CONTROL 10ML LL (SYRINGE) ×3 IMPLANT
TOWEL OR 17X24 6PK STRL BLUE (TOWEL DISPOSABLE) ×3 IMPLANT
TRAY FOLEY CATH SILVER 14FR (SET/KITS/TRAYS/PACK) ×3 IMPLANT

## 2017-01-23 NOTE — Progress Notes (Signed)
Jade GauzeGabrielle E Taylor is a 26 y.o. [id number] at 7132w3d   Subjective: Patient says pain is increasing due to more frequent contractions. Epidural is working well. No other complaints.  Objective: BP 110/60   Pulse 78   Temp 98.5 F (36.9 C) (Oral)   Resp 20   LMP  (LMP Unknown)   SpO2 99%  No intake/output data recorded. Total I/O In: -  Out: 850 [Urine:850]  FHT:  FHR: 140 bpm, variability: moderate,  accelerations:  Present,  decelerations:  Absent UC:   regular, every 2-3 minutes SVE:   Dilation: 3.5 Effacement (%): 60 Station: -3 Exam by:: Abelardo DieselMcMullen, MD  Labs: Lab Results  Component Value Date   WBC 14.8 (H) 01/22/2017   HGB 11.8 (L) 01/22/2017   HCT 34.0 (L) 01/22/2017   MCV 86.7 01/22/2017   PLT 181 01/22/2017    Assessment / Plan: Induction of labor due to h/o IUFD x2, IUPC inserted with bloody return easily flushed with great reads.  Labor: Progressing normally, will restart and titrate pit to achieve adequate contractions Preeclampsia:  None Fetal Wellbeing:  Category I Pain Control:  Epidural I/D:  n/a Anticipated MOD:  NSVD  Wendee Beaversavid J McMullen, DO, PGY-1 01/23/2017, 11:09 AM

## 2017-01-23 NOTE — Consult Note (Signed)
Neonatology Note:   Attendance at C-section:    I was asked by Dr. Harraway-Smith to attend this C/S at term due to failed IOL and FTP. The mother is a G5P2A2L0 A pos, GBS neg with history of 2 IUFDs at 23 and 28 weeks (one had true knot in cord, the other etiology unknown). ROM 14 hours prior to delivery, fluid with light meconium, but clear at c-section. Infant vigorous with good spontaneous cry and tone. Needed only minimal bulb suctioning. Delayed cord clamping for 40 seconds. Ap 9/9. Lungs clear to ausc in DR. To CN to care of Pediatrician.   Jade Taylor C. Jade Kohut, MD 

## 2017-01-23 NOTE — Op Note (Signed)
Cesarean Section Operative Report  Jade Taylor  01/22/2017 - 01/23/2017  Indications: Failure to progress   Pre-operative Diagnosis: Primary low transverse cesarean, failure to progress   Post-operative Diagnosis: Same   Surgeon: Surgeon(s) and Role:    * Willodean Rosenthalarolyn Harraway-Smith, MD - Primary    * Michaele OfferElizabeth Woodland Kassandra Meriweather, DO - Fellow   Attending Attestation: I was present and scrubbed for the entire procedure.   Assistants: Jen MowElizabeth Kriston Mckinnie, DO  Anesthesia: epidural    Estimated Blood Loss: 800 ml  Total IV Fluids: 1200 ml LR  Urine Output:: 50 ml clear urine  Specimens: None  Findings: Viable female infant in cephalic presentation; Apgars 9/9; weight pending; clear amniotic fluid; intact placenta with three vessel cord; normal uterus, fallopian tubes and ovaries bilaterally.  Baby condition / location:  Couplet care / Skin to Skin   Complications: no complications  Indications: Jade Taylor is a 26 y.o. B1Y7829G5P1221 with an IUP 2070w3d presenting with failure to progress.  The risks, benefits, complications, treatment options, and exected outcomes were discussed with the patient . The patient dwith the proposed plan, giving informed consent. identified as Jade Taylor and the procedure verified as C-Section Delivery.  Procedure Details:  The patient was taken back to the operative suite where epidural anesthesia was bolused.  A time out was held and the above information confirmed.   After induction of anesthesia, the patient was draped and prepped in the usual sterile manner and placed in a dorsal supine position with a leftward tilt. A Pfannenstiel incision was made and carried down through the subcutaneous tissue to the fascia. Fascial incision was made and sharply extended transversely with Mayo scissors. The fascia was separated from the underlying rectus tissue superiorly and inferiorly. The peritoneum was identified and bluntly entered and extended  longitudinally. A bladder flap was created. A low transverse uterine incision was made and extended bluntly. Delivered from cephalic presentation was a viable infant with Apgars and weight as above.  After waiting 60 seconds for delayed cord cutting, the umbilical cord was clamped and cut cord blood was obtained for evaluation. Cord ph was not sent. The placenta was removed Intact and appeared normal. The uterine outline, tubes and ovaries appeared normal. The uterine incision was closed with running locked sutures of 0Vicryl with an imbricating layer of the same.   Hemostasis was observed. The rectus muscles with the peritoneum was closed with 0 Vicryl using a single interrupted stitch. The rectus muscles were examined and hemostasis observed. The fascia was then reapproximated with running sutures of 0Vicryl. A total of 30 ml 0.5% Marcaine was injected subcutaneously at the margins of the incision. The subcuticular closure was performed using 3-0Vicryl. The skin was closed with 4-0Vicryl.   Instrument, sponge, and needle counts were correct prior the abdominal closure and were correct at the conclusion of the case.     Disposition: PACU - hemodynamically stable.   Maternal Condition: stable       SignedJustice Deeds: Sherryn Pollino MumawDO 01/23/2017 8:25 PM

## 2017-01-23 NOTE — Anesthesia Postprocedure Evaluation (Signed)
Anesthesia Post Note  Patient: Cherrie GauzeGabrielle E Serio  Procedure(s) Performed: Procedure(s) (LRB): CESAREAN SECTION (N/A)  Patient location during evaluation: PACU Anesthesia Type: Epidural Level of consciousness: awake, awake and alert and oriented Pain management: pain level controlled Vital Signs Assessment: post-procedure vital signs reviewed and stable Respiratory status: spontaneous breathing, nonlabored ventilation and respiratory function stable Cardiovascular status: stable Postop Assessment: no headache, no backache, epidural receding, patient able to bend at knees and no signs of nausea or vomiting Anesthetic complications: no        Last Vitals:  Vitals:   01/23/17 2145 01/23/17 2156  BP:  117/63  Pulse: 67 73  Resp:  16  Temp:  37.8 C    Last Pain:  Vitals:   01/23/17 2100  TempSrc:   PainSc: 4    Pain Goal: Patients Stated Pain Goal: 2 (01/23/17 2004)               Cecile HearingStephen Edward Deyna Carbon

## 2017-01-23 NOTE — Progress Notes (Signed)
Patient ID: Holli HumblesGabrielle E Hagstrom, female   DOB: 05-Dec-1990, 26 y.o.   MRN: [id number] Pt has not changed her cervix despite multiple doses of cytotec and pitocin. On last check her cervix has become swollen.  Will proceed to primary cesarean section for failed induction.  The risks of cesarean section discussed with the patient included but were not limited to: bleeding which may require transfusion or reoperation; infection which may require antibiotics; injury to bowel, bladder, ureters or other surrounding organs; injury to the fetus; need for additional procedures including hysterectomy in the event of a life-threatening hemorrhage; placental abnormalities wth subsequent pregnancies, incisional problems, thromboembolic phenomenon and other postoperative/anesthesia complications. The patient concurred with the proposed plan, giving informed written consent for the procedure.   Patient has been NPO and she will remain NPO for procedure. Anesthesia and OR aware. Preoperative prophylactic antibiotics and SCDs ordered on call to the OR.  To OR when ready.  Zuma Hust L. Harraway-Smith, M.D., Evern CoreFACOG

## 2017-01-23 NOTE — Progress Notes (Signed)
Patient ID: Jade Taylor, female   DOB: [birth year], 26 y.o.   MRN: [id number] Doing well, but feeling pain in bottom  Vitals:   01/23/17 1332 01/23/17 1402 01/23/17 1424 01/23/17 1502  BP: 113/64 98/62 (!) 104/57 (!) 112/55  Pulse: 67 72 80 76  Resp: 20 17 18 20   Temp:   98.2 F (36.8 C)   TempSrc:   Oral   SpO2:       FHR reactive UCs adequate per IUPC  Dilation: 4.5 Effacement (%): 80 Cervical Position: Middle Station: -2 Presentation: Vertex Exam by:: Wynelle BourgeoisMarie Nollan Muldrow, CNM   Vertex molding EFW by leopolds is 7.5lbs by my estimate, possibly bigger  Will continue to observe

## 2017-01-23 NOTE — Transfer of Care (Signed)
Immediate Anesthesia Transfer of Care Note  Patient: Jade Taylor  Procedure(s) Performed: Procedure(s): CESAREAN SECTION (N/A)  Patient Location: PACU  Anesthesia Type:Epidural  Level of Consciousness: awake, alert  and oriented  Airway & Oxygen Therapy: Patient Spontanous Breathing  Post-op Assessment: Report given to RN and Post -op Vital signs reviewed and stable  Post vital signs: Reviewed and stableSao2 99%, HR 81, RR18, BP 111/90  Last Vitals:  Vitals:   01/23/17 1602 01/23/17 1632  BP: (!) 108/57 (!) 100/59  Pulse: (!) 102 79  Resp: 17 18  Temp:  36.8 C    Last Pain:  Vitals:   01/23/17 1721  TempSrc:   PainSc: 8       Patients Stated Pain Goal: 7 (01/23/17 0550)  Complications: No apparent anesthesia complications

## 2017-01-23 NOTE — Anesthesia Procedure Notes (Signed)
Epidural Patient location during procedure: OB Start time: 01/23/2017 5:56 AM End time: 01/23/2017 6:08 AM  Staffing Anesthesiologist: Heather RobertsSINGER, Torres Hardenbrook Performed: anesthesiologist   Preanesthetic Checklist Completed: patient identified, site marked, pre-op evaluation, timeout performed, IV checked, risks and benefits discussed and monitors and equipment checked  Epidural Patient position: sitting Prep: DuraPrep Patient monitoring: heart rate, cardiac monitor, continuous pulse ox and blood pressure Approach: midline Location: L2-L3 Injection technique: LOR saline  Needle:  Needle type: Tuohy  Needle gauge: 17 G Needle length: 9 cm Needle insertion depth: 8 cm Catheter size: 20 Guage Catheter at skin depth: 12 cm Test dose: negative and Other  Assessment Events: blood not aspirated, injection not painful, no injection resistance and negative IV test  Additional Notes Informed consent obtained prior to proceeding including risk of failure, 1% risk of PDPH, risk of minor discomfort and bruising.  Discussed rare but serious complications including epidural abscess, permanent nerve injury, epidural hematoma.  Discussed alternatives to epidural analgesia and patient desires to proceed.  Timeout performed pre-procedure verifying patient name, procedure, and platelet count.  Patient tolerated procedure well.

## 2017-01-23 NOTE — Anesthesia Preprocedure Evaluation (Addendum)
Anesthesia Evaluation  Patient identified by MRN, date of birth, ID band Patient awake    Reviewed: Allergy & Precautions, NPO status , Patient's Chart, lab work & pertinent test results  History of Anesthesia Complications Negative for: history of anesthetic complications  Airway Mallampati: II  TM Distance: >3 FB Neck ROM: Full    Dental no notable dental hx. (+) Dental Advisory Given   Pulmonary asthma , former smoker,    Pulmonary exam normal        Cardiovascular negative cardio ROS Normal cardiovascular exam     Neuro/Psych negative neurological ROS  negative psych ROS   GI/Hepatic negative GI ROS, Neg liver ROS,   Endo/Other  Morbid obesity  Renal/GU negative Renal ROS  negative genitourinary   Musculoskeletal negative musculoskeletal ROS (+)   Abdominal   Peds negative pediatric ROS (+)  Hematology negative hematology ROS (+)   Anesthesia Other Findings   Reproductive/Obstetrics negative OB ROS                             Anesthesia Physical Anesthesia Plan  ASA: III and emergent  Anesthesia Plan: Epidural   Post-op Pain Management:    Induction:   Airway Management Planned: Natural Airway  Additional Equipment:   Intra-op Plan:   Post-operative Plan:   Informed Consent: I have reviewed the patients History and Physical, chart, labs and discussed the procedure including the risks, benefits and alternatives for the proposed anesthesia with the patient or authorized representative who has indicated his/her understanding and acceptance.   Dental advisory given  Plan Discussed with: Anesthesiologist  Anesthesia Plan Comments:        Anesthesia Quick Evaluation

## 2017-01-23 NOTE — Progress Notes (Signed)
Cherrie GauzeGabrielle E Sedlacek is a 26 y.o. [id number] at 6715w3d   Subjective: Patient says pain is well controlled on epidural. No new complaints.  Objective: BP 121/68   Pulse 73   Temp 98.7 F (37.1 C) (Oral)   Resp 18   LMP  (LMP Unknown)   SpO2 99%  No intake/output data recorded. Total I/O In: -  Out: 850 [Urine:850]  FHT:  FHR: 135 bpm, variability: moderate,  accelerations:  Present,  decelerations:  Absent UC:   regular, every 2-3 minutes SVE:   Dilation: 5 Effacement (%): 70 Station: -3, -2 Exam by:: Devra DoppAmber Knox, RN   Labs: Lab Results  Component Value Date   WBC 14.8 (H) 01/22/2017   HGB 11.8 (L) 01/22/2017   HCT 34.0 (L) 01/22/2017   MCV 86.7 01/22/2017   PLT 181 01/22/2017    Assessment / Plan: Induction of labor due to h/o IUFD x2,  progressing well on pitocin  Labor: Progressing normally Preeclampsia:  None Fetal Wellbeing:  Category I Pain Control:  Epidural I/D:  n/a Anticipated MOD:  NSVD  Wendee Beaversavid J McMullen, DO, PGY-1 01/23/2017, 1:17 PM

## 2017-01-24 ENCOUNTER — Encounter (HOSPITAL_COMMUNITY): Payer: Self-pay

## 2017-01-24 LAB — CBC
HCT: 30.7 % — ABNORMAL LOW (ref 36.0–46.0)
HEMOGLOBIN: 10.8 g/dL — AB (ref 12.0–15.0)
MCH: 30.4 pg (ref 26.0–34.0)
MCHC: 35.2 g/dL (ref 30.0–36.0)
MCV: 86.5 fL (ref 78.0–100.0)
Platelets: 191 10*3/uL (ref 150–400)
RBC: 3.55 MIL/uL — ABNORMAL LOW (ref 3.87–5.11)
RDW: 13.6 % (ref 11.5–15.5)
WBC: 19.4 10*3/uL — AB (ref 4.0–10.5)

## 2017-01-24 NOTE — Anesthesia Postprocedure Evaluation (Signed)
Anesthesia Post Note  Patient: Jade Taylor  Procedure(s) Performed: Procedure(s) (LRB): CESAREAN SECTION (N/A)  Patient location during evaluation: Mother Baby Anesthesia Type: Epidural Level of consciousness: awake and alert Pain management: satisfactory to patient Vital Signs Assessment: post-procedure vital signs reviewed and stable Respiratory status: respiratory function stable Cardiovascular status: stable Postop Assessment: no headache, no backache, epidural receding, patient able to bend at knees, no signs of nausea or vomiting and adequate PO intake Anesthetic complications: no        Last Vitals:  Vitals:   01/24/17 0456 01/24/17 0900  BP: (!) 113/59 (!) 100/54  Pulse: 64 68  Resp: 18 20  Temp: 36.8 C 36.7 C    Last Pain:  Vitals:   01/24/17 0456  TempSrc:   PainSc: 0-No pain   Pain Goal: Patients Stated Pain Goal: 2 (01/23/17 2004)               Artesia Berkey

## 2017-01-24 NOTE — Addendum Note (Signed)
Addendum  created 01/24/17 0912 by Graciela HusbandsWynn O Anhelica Fowers, CRNA   Sign clinical note

## 2017-01-24 NOTE — Clinical Social Work Maternal (Signed)
  CLINICAL SOCIAL WORK MATERNAL/CHILD NOTE  Patient Details  Name: Jade Taylor MRN: [id number] Date of Birth: [birth year]  Date:  01/24/2017  Clinical Social Worker Initiating Note:  Laurey Arrow Date/ Time Initiated:  01/24/17/1556     Child's Name:  Jade Taylor   Legal Guardian:  Mother (FOB is Susy Manor 02/28/1986)   Need for Interpreter:  None   Date of Referral:  01/24/17     Reason for Referral:  Current Substance Use/Substance Use During Pregnancy  (hx of THC use. )   Referral Source:  [employer]   Address:  [place]. [place] [address]  Phone number:  [phone]   Household Members:  Self, Significant Other   Natural Supports (not living in the home):  Immediate Family, Extended Family, Friends, Parent (FOB's family will also be a source of support.)   Professional Supports: None   Employment: Unemployed   Type of Work:     Education:      Pensions consultant:      Other Resources:      Cultural/Religious Considerations Which May Impact Care:  None Reported  Strengths:  Ability to meet basic needs , Home prepared for child    Risk Factors/Current Problems:  Substance Use    Cognitive State:  Able to Concentrate , Alert , Linear Thinking , Insightful    Mood/Affect:  Bright , Happy , Interested , Comfortable    CSW Assessment: CSW met with [name] to complete an assessment for hx THC.  [name] was receptive to meeting with CSW.  When CSW arrived, [name] was resting in bed and infant was asleep in bassinet.  CSW inquired about [name]'s substance use hx, and [name] acknowledged the use of marijuana throughou pregnancy.  [name] denied the use of any substance during pregnancy. CSW informed [name] of the hospital's drug screen policy, and informed [name] of the 2 screenings for the infant. [name] appeared understanding and communicated she was not concerned about the infant having a positive UDS or CDS. CSW shared with [name] that the infant's UDS negative and  CSW will continue to monitor the infant's CDS. CSW made [name] aware that if the infant's CDS is positive without an explanation, CSW will make a report to [facility].  [name] did not have any questions regarding the hospital's policy. CSW offered [name] resources and referrals for SA, and [name] declined. [name] reported that [name] engaged in marijuana use until [name] was 28 weeks.  [name] stated that [name] smoke to help decease [name]'s anxiety as it related to [name]'sprevious miscarriages and loss. CSW thanked [name] for [employer]. CSW educated [name] about PPD. CSW informed [name] of possible supports and interventions to decrease PPD.  CSW also encouraged [name] to seek medical attention if needed for increased signs and symptoms for PPD. CSW reviewed safe sleep, and SIDS. [name] was knowledgeable and asked appropriate questions.  [name] communicated that she has a pack n play for the baby, and feels prepared for the infant.  [name] did not have any further questions, concerns, or needs at this time. CSW thanked [name] for meeting with CSW and provided [name] with CSW contact information.  CSW Plan/Description:  No Further Intervention Required/No Barriers to Discharge, Information/Referral to [employer] , Patient/Family Education  (CSW will monitor infant's cord and will make a report if warranted. )    Kendi Defalco D BOYD-GILYARD, LCSW 01/24/2017, 3:59 PM

## 2017-01-24 NOTE — Progress Notes (Signed)
Post OP Day #1 Subjective: no complaints, up ad lib and tolerating PO   Objective: Blood pressure (!) 113/59, pulse 64, temperature 98.3 F (36.8 C), resp. rate 18, SpO2 95 %, unknown if currently breastfeeding.  Physical Exam:  General: alert Lochia: appropriate Uterine Fundus: firm and NT at U Pressure dressing c/d/i DVT Evaluation: No evidence of DVT seen on physical exam.   Recent Labs  01/22/17 0100 01/24/17 0521  HGB 11.8* 10.8*  HCT 34.0* 30.7*    Assessment/Plan: Continue current care   LOS: 2 days   Allie BossierMyra C Daizha Anand 01/24/2017, 6:38 AM

## 2017-01-24 NOTE — Lactation Note (Signed)
This note was copied from a baby's chart. Lactation Consultation Note  Patient Name: Jade Taylor: 01/24/2017 Reason for consult: Initial assessment   Initial consult; mom has only BF once and formula via bottle since birth 18 hrs ago.  GA 39.3; BW 7 lbs, 6.9 oz.  Hx +THC -urine.  Voids-3; stools-4.  Mom is a 64P1 Mom stated she plans to only formula both for her duration here in hospital and at home. Gave Formula amount guideline sheet with instructions to increase amounts after day 3 of life based on infant's cues for more milk.   Gave Formula preparation sheet and formula information sheet. Reviewed paced feeding method and gave rationale for using paced feeding method with bottle feeding.   Lactation brochure and information given since brought into room.   Mom was appreciative of information given.     Consult Status Consult Status: Complete    Jade Taylor, Jade Taylor 01/24/2017, 1:05 PM

## 2017-01-25 LAB — BIRTH TISSUE RECOVERY COLLECTION (PLACENTA DONATION)

## 2017-01-25 MED ORDER — OXYCODONE HCL 5 MG PO TABS: 5.0000 mg | ORAL_TABLET | ORAL | 0 refills | Status: DC | PRN

## 2017-01-25 MED ORDER — IBUPROFEN 600 MG PO TABS: 600.0000 mg | ORAL_TABLET | Freq: Four times a day (QID) | ORAL | 0 refills | Status: DC | PRN

## 2017-01-25 NOTE — Discharge Summary (Signed)
OB Discharge Summary     Patient Name: Jade Taylor DOB: 1991-04-02 MRN: [id number]  Date of admission: 01/22/2017 Delivering MD: Willodean RosenthalHARRAWAY-SMITH, CAROLYN   Date of discharge: 01/25/2017  Admitting diagnosis: INDUCTION Intrauterine pregnancy: 331w3d     Secondary diagnosis:  Active Problems:   High-risk pregnancy IOL due to hx IUFD @ 24 and 28wks  Additional problems: +THC use in preg; asthma; chronic pain; PTSD     Discharge diagnosis: Term Pregnancy Delivered                                                                                                Post partum procedures:none  Augmentation: Pitocin and Cytotec  Complications: None  Hospital course:  Induction of Labor With Cesarean Section  26 y.o. yo [id number] at 611w3d was admitted to the hospital 01/22/2017 for induction of labor. Patient had a labor course significant for being induced at midnight of 2/28 due to hx IUFD. She received multiple doses of cytotec during the night and then had Pitocin during the following day without much progress. Cytotec for the second night was initiated, followed by Pit the following day with eventual failed progress of labor/swollen cx by the evening of 3/1. The patient went for cesarean section due to Arrest of Dilation, and delivered a Viable infant,@BABYSUPPRESS (DBLINK,ept,110,,1,,) Membrane Rupture Time/Date: )4:37 AM ,01/23/2017   @Details  of operation can be found in separate operative Note.  Patient had an uncomplicated postpartum course. She is ambulating, tolerating a regular diet, passing flatus, and urinating well.  Patient is discharged home in stable condition on 01/25/17.                                    Physical exam  Vitals:   01/24/17 0900 01/24/17 1300 01/24/17 1700 01/25/17 0500  BP: (!) 100/54 (!) 102/58 (!) 97/49 101/70  Pulse: 68 80 83 87  Resp: 20 20 (!) 22 20  Temp: 98.1 F (36.7 C) 98.5 F (36.9 C) 98.4 F (36.9 C) 98 F (36.7 C)  TempSrc:   Oral Oral   SpO2: 98% 99% 99%    General: alert and cooperative Lochia: appropriate Uterine Fundus: firm Incision: pressure dsg still intact, to be removed today for shower DVT Evaluation: No evidence of DVT seen on physical exam. Labs: Hgb 2/28- 11.8 Lab Results  Component Value Date   WBC 19.4 (H) 01/24/2017   HGB 10.8 (L) 01/24/2017   HCT 30.7 (L) 01/24/2017   MCV 86.5 01/24/2017   PLT 191 01/24/2017   CMP Latest Ref Rng & Units 07/10/2014  Glucose 70 - 99 mg/dL 147(W100(H)  BUN 6 - 23 mg/dL 5(L)  Creatinine 2.950.50 - 1.10 mg/dL 6.210.70  Sodium 308137 - 657147 mEq/L 139  Potassium 3.7 - 5.3 mEq/L 3.4(L)  Chloride 96 - 112 mEq/L 102  CO2 19 - 32 mEq/L 26  Calcium 8.4 - 10.5 mg/dL 9.4  Total Protein 6.0 - 8.3 g/dL -  Total Bilirubin 0.3 - 1.2 mg/dL -  Alkaline Phos 39 - 846117 U/L -  AST 0 - 37 U/L -  ALT 0 - 35 U/L -    Discharge instruction: per After Visit Summary and "Baby and Me Booklet".  After visit meds:  Allergies as of 01/25/2017      Reactions   Metronidazole Hives, Shortness Of Breath, Rash, Other (See Comments)   Rash, Burning sensation- was hospitalized   Penicillins Shortness Of Breath, Itching, Rash   Has patient had a PCN reaction causing immediate rash, facial/tongue/throat swelling, SOB or lightheadedness with hypotension: No Has patient had a PCN reaction causing severe rash involving mucus membranes or skin necrosis: No Has patient had a PCN reaction that required hospitalization Yes Has patient had a PCN reaction occurring within the last 10 years: No If all of the above answers are "NO", then may proceed with Cephalosporin use.   Amoxicillin Hives   Has patient had a PCN reaction causing immediate rash, facial/tongue/throat swelling, SOB or lightheadedness with hypotension: Yes Has patient had a PCN reaction causing severe rash involving mucus membranes or skin necrosis: No Has patient had a PCN reaction that required hospitalization No Has patient had a PCN reaction occurring  within the last 10 years: No If all of the above answers are "NO", then may proceed with Cephalosporin use.      Medication List    STOP taking these medications   aspirin EC 81 MG tablet     TAKE these medications   ibuprofen 600 MG tablet Commonly known as:  ADVIL,MOTRIN Take 1 tablet (600 mg total) by mouth every 6 (six) hours as needed.   oxyCODONE 5 MG immediate release tablet Commonly known as:  Oxy IR/ROXICODONE Take 1 tablet (5 mg total) by mouth every 4 (four) hours as needed (pain scale 4-7).   prenatal multivitamin Tabs tablet Take 1 tablet by mouth daily at 12 noon.       Diet: routine diet  Activity: Advance as tolerated. Pelvic rest for 6 weeks.   Outpatient follow up:1-2 week incision check, then 4-6 wk PP visit Follow up Appt:Future Appointments Date Time Provider Department Center  02/28/2017 9:30 AM Cheral Marker, CNM [facility]   Follow up Visit:No Follow-up on file.  Postpartum contraception: Combination OCPs  Newborn Data: Live born female  Birth Weight: 7 lb 6.9 oz (3370 g) APGAR: 9, 9  Baby Feeding: Bottle Disposition:home with mother   01/25/2017 Cam Hai, CNM  10:09 AM

## 2017-01-31 ENCOUNTER — Ambulatory Visit (INDEPENDENT_AMBULATORY_CARE_PROVIDER_SITE_OTHER): Payer: Medicaid Other | Admitting: Obstetrics & Gynecology

## 2017-01-31 ENCOUNTER — Encounter: Payer: Self-pay | Admitting: Obstetrics & Gynecology

## 2017-01-31 VITALS — BP 110/60 | HR 98 | Temp 98.2°F | Ht 62.0 in | Wt 235.5 lb

## 2017-01-31 DIAGNOSIS — Z9889 Other specified postprocedural states: Secondary | ICD-10-CM

## 2017-01-31 DIAGNOSIS — L0390 Cellulitis, unspecified: Secondary | ICD-10-CM

## 2017-01-31 DIAGNOSIS — Z98891 History of uterine scar from previous surgery: Secondary | ICD-10-CM | POA: Diagnosis not present

## 2017-01-31 MED ORDER — SILVER SULFADIAZINE 1 % EX CREA: TOPICAL_CREAM | CUTANEOUS | 11 refills | Status: DC

## 2017-01-31 MED ORDER — CLINDAMYCIN HCL 300 MG PO CAPS: 300.0000 mg | ORAL_CAPSULE | Freq: Three times a day (TID) | ORAL | 0 refills | Status: DC

## 2017-01-31 NOTE — Progress Notes (Signed)
  HPI: Patient returns for routine postoperative follow-up having undergone primary Caesaen section on 01/23/2017.  The patient's immediate postoperative recovery has been unremarkable. Since hospital discharge the patient reports blister and cellulitis from the C section drape adhesive.   Current Outpatient Prescriptions: acetaminophen (TYLENOL) 325 MG tablet, Take 325 mg by mouth as needed., Disp: , Rfl:  ibuprofen (ADVIL,MOTRIN) 600 MG tablet, Take 1 tablet (600 mg total) by mouth every 6 (six) hours as needed., Disp: 30 tablet, Rfl: 0 oxyCODONE (OXY IR/ROXICODONE) 5 MG immediate release tablet, Take 1 tablet (5 mg total) by mouth every 4 (four) hours as needed (pain scale 4-7)., Disp: 50 tablet, Rfl: 0 Prenatal Vit-Fe Fumarate-FA (PRENATAL MULTIVITAMIN) TABS tablet, Take 1 tablet by mouth daily at 12 noon., Disp: , Rfl:  clindamycin (CLEOCIN) 300 MG capsule, Take 1 capsule (300 mg total) by mouth 3 (three) times daily., Disp: 30 capsule, Rfl: 0 silver sulfADIAZINE (SILVADENE) 1 % cream, Use to are 3 times a day, Disp: 50 g, Rfl: 11  No current facility-administered medications for this visit.     Blood pressure 110/60, pulse 98, temperature 98.2 F (36.8 C), height 5\' 2"  (1.575 m), weight 235 lb 8 oz (106.8 kg), not currently breastfeeding.  Physical Exam: Cellulitis surrounding a ruptured blister from post operative tape Incision itself looks good  Diagnostic Tests:   Pathology:   Impression: S/p primary c section with post operative cellulitis around drape adhesive  Plan: 10 days  Follow up:   10 days  Lazaro ArmsEURE,LUTHER H, MD

## 2017-02-10 ENCOUNTER — Telehealth: Payer: Self-pay | Admitting: Obstetrics and Gynecology

## 2017-02-10 ENCOUNTER — Other Ambulatory Visit: Payer: Self-pay | Admitting: Obstetrics & Gynecology

## 2017-02-10 MED ORDER — SILVER SULFADIAZINE 1 % EX CREA: TOPICAL_CREAM | CUTANEOUS | 11 refills | Status: DC

## 2017-02-10 NOTE — Telephone Encounter (Signed)
Pt called stating that she would like a medication called into her pharmacy, [facility] in [place] please contact pt

## 2017-02-10 NOTE — Telephone Encounter (Signed)
Patient called stating she has refills on her Silvadene cream but Medicaid will not pay for it for another 15 days. The pharmacist states if the dosage is increased to either 100g or 150g, then they should cover it. Please advise.

## 2017-02-28 ENCOUNTER — Ambulatory Visit (INDEPENDENT_AMBULATORY_CARE_PROVIDER_SITE_OTHER): Payer: Medicaid Other | Admitting: Women's Health

## 2017-02-28 ENCOUNTER — Encounter: Payer: Self-pay | Admitting: Women's Health

## 2017-02-28 DIAGNOSIS — Z91048 Other nonmedicinal substance allergy status: Secondary | ICD-10-CM

## 2017-02-28 MED ORDER — NORETHIN-ETH ESTRAD-FE BIPHAS 1 MG-10 MCG / 10 MCG PO TABS
1.0000 | ORAL_TABLET | Freq: Every day | ORAL | 3 refills | Status: DC
Start: 2017-02-28 — End: 2017-06-16

## 2017-02-28 NOTE — Progress Notes (Signed)
Subjective:    Jade Taylor is a 26 y.o. G50P1221 Caucasian female who presents for a postpartum visit. She is 4 weeks postpartum following a primary cesarean section, low transverse incision at 39.3 gestational weeks d/t failed IOL/FTP @ 4.5cm, after multiple doses of cytotec and arom/rounds of pitocin. IOL was for h/o IUFD x 2. Baby weighed 7lb6oz. Anesthesia: epidural. I have fully reviewed the prenatal and intrapartum course. Postpartum course has been complicated by large blister/cellulitis Rt abd from c/s drape- seen by Dr. Despina Hidden 3/9, rx'd silvadene cream TID. Area still not completely healed, but pt states is much better and much smaller. Still using silvadene TID. No s/s infection. Baby's course has been uncomplicated. Baby is feeding by bottle. Bleeding no bleeding. Bowel function is normal. Bladder function is normal. Patient is not sexually active. Last sexual activity: prior to birth of baby. Contraception method is abstinence and wants pills. Does not smoke, no h/o HTN, DVT/PE, CVA, MI, or migraines w/ aura. Postpartum depression screening: negative. Score 0.  Last pap 09/15/14 and was neg at [facility].  The following portions of the patient's history were reviewed and updated as appropriate: allergies, current medications, past medical history, past surgical history and problem list.  Review of Systems Pertinent items are noted in HPI.   Vitals:   02/28/17 0934  BP: 110/70  Pulse: 78  Weight: 229 lb 12.8 oz (104.2 kg)   No LMP recorded (lmp unknown).  Objective:   General:  alert, cooperative and no distress   Breasts:  deferred, no complaints  Lungs: clear to auscultation bilaterally  Heart:  regular rate and rhythm  Abdomen: soft, nontender, c/s incision well healed, small suture sticking out from mid/rt side- removed. ~2cm round area of healing/granulation tissue from c/s drape. Co-exam w/ LHE who saw her earlier- looking much better   Vulva: normal  Vagina: normal  vagina  Cervix:  closed  Corpus: Well-involuted  Adnexa:  Non-palpable  Rectal Exam: No hemorrhoids        Assessment:   Postpartum exam 4 wks s/p PLTCS for failed IOL/FTP bottlefeeding Healing blister/cellulitis Depression screening Contraception counseling   Plan:  Contraception: rx LoLoestrin 3pk w/ 3RF, condoms x 2wks Follow up in: 3 months for coc f/u, or earlier if needed Continue silvadene TID to abdominal wound- if not continuing to improve or if worsening let us know  Marge Duncans CNM, [facility] 02/28/2017 9:58 AM

## 2017-02-28 NOTE — Patient Instructions (Addendum)
Hidradenitis Suppurtiva . Loose, light clothing. Avoid heat, friction, shearing. Don't squeeze! . Wash clothes in perfume/dye free detergent . Gentle non-soap cleanser, wash gently w/ fingers (No cloth, loofah), can use antibacterial cleanser . Stop smoking! . Weight loss . Decrease/no dairy  Oral Contraception Use Oral contraceptive pills (OCPs) are medicines taken to prevent pregnancy. OCPs work by preventing the ovaries from releasing eggs. The hormones in OCPs also cause the cervical mucus to thicken, preventing the sperm from entering the uterus. The hormones also cause the uterine lining to become thin, not allowing a fertilized egg to attach to the inside of the uterus. OCPs are highly effective when taken exactly as prescribed. However, OCPs do not prevent sexually transmitted diseases (STDs). Safe sex practices, such as using condoms along with an OCP, can help prevent STDs. Before taking OCPs, you may have a physical exam and Pap test. Your health care provider may also order blood tests if necessary. Your health care provider will make sure you are a good candidate for oral contraception. Discuss with your health care provider the possible side effects of the OCP you may be prescribed. When starting an OCP, it can take 2 to 3 months for the body to adjust to the changes in hormone levels in your body. How to take oral contraceptive pills Your health care provider may advise you on how to start taking the first cycle of OCPs. Otherwise, you can:  Start on day 1 of your menstrual period. You will not need any backup contraceptive protection with this start time.  Start on the first Sunday after your menstrual period or the day you get your prescription. In these cases, you will need to use backup contraceptive protection for the first week.  Start the pill at any time of your cycle. If you take the pill within 5 days of the start of your period, you are protected against pregnancy right  away. In this case, you will not need a backup form of birth control. If you start at any other time of your menstrual cycle, you will need to use another form of birth control for 7 days. If your OCP is the type called a minipill, it will protect you from pregnancy after taking it for 2 days (48 hours). After you have started taking OCPs:  If you forget to take 1 pill, take it as soon as you remember. Take the next pill at the regular time.  If you miss 2 or more pills, call your health care provider because different pills have different instructions for missed doses. Use backup birth control until your next menstrual period starts.  If you use a 28-day pack that contains inactive pills and you miss 1 of the last 7 pills (pills with no hormones), it will not matter. Throw away the rest of the non-hormone pills and start a new pill pack. No matter which day you start the OCP, you will always start a new pack on that same day of the week. Have an extra pack of OCPs and a backup contraceptive method available in case you miss some pills or lose your OCP pack. Follow these instructions at home:  Do not smoke.  Always use a condom to protect against STDs. OCPs do not protect against STDs.  Use a calendar to mark your menstrual period days.  Read the information and directions that came with your OCP. Talk to your health care provider if you have questions. Contact a health care provider if:  You develop nausea and vomiting.  You have abnormal vaginal discharge or bleeding.  You develop a rash.  You miss your menstrual period.  You are losing your hair.  You need treatment for mood swings or depression.  You get dizzy when taking the OCP.  You develop acne from taking the OCP.  You become pregnant. Get help right away if:  You develop chest pain.  You develop shortness of breath.  You have an uncontrolled or severe headache.  You develop numbness or slurred speech.  You  develop visual problems.  You develop pain, redness, and swelling in the legs. This information is not intended to replace advice given to you by your health care provider. Make sure you discuss any questions you have with your health care provider. Document Released: 10/31/2011 Document Revised: 04/18/2016 Document Reviewed: 05/02/2013 Elsevier Interactive Patient Education  2017 [employer]  Hidradenitis Suppurativa Hidradenitis suppurativa is a long-term (chronic) skin disease that starts with blocked sweat glands or hair follicles. Bacteria may grow in these blocked openings of your skin. Hidradenitis suppurativa is like a severe form of acne that develops in areas of your body where acne would be unusual. It is most likely to affect the areas of your body where skin rubs against skin and becomes moist. This includes your:  Underarms.  Groin.  Genital areas.  Buttocks.  Upper thighs.  Breasts. Hidradenitis suppurativa may start out with small pimples. The pimples can develop into deep sores that break open (rupture) and drain pus. Over time your skin may thicken and become scarred. Hidradenitis suppurativa cannot be passed from person to person. What are the causes? The exact cause of hidradenitis suppurativa is not known. This condition may be due to:  Female and female hormones. The condition is rare before and after puberty.  An overactive body defense system (immune system). Your immune system may overreact to the blocked hair follicles or sweat glands and cause swelling and pus-filled sores. What increases the risk? You may have a higher risk of hidradenitis suppurativa if you:  Are a woman.  Are between ages 36 and 61.  Have a family history of hidradenitis suppurativa.  Have a personal history of acne.  Are overweight.  Smoke.  Take the drug lithium. What are the signs or symptoms? The first signs of an outbreak are usually painful skin bumps that look like  pimples. As the condition progresses:  Skin bumps may get bigger and grow deeper into the skin.  Bumps under the skin may rupture and drain smelly pus.  Skin may become itchy and infected.  Skin may thicken and scar.  Drainage may continue through tunnels under the skin (fistulas).  Walking and moving your arms can become painful. How is this diagnosed? Your health care provider may diagnose hidradenitis suppurativa based on your medical history and your signs and symptoms. A physical exam will also be done. You may need to see a health care provider who specializes in skin diseases (dermatologist). You may also have tests done to confirm the diagnosis. These can include:  Swabbing a sample of pus or drainage from your skin so it can be sent to the lab and tested for infection.  Blood tests to check for infection. How is this treated? The same treatment will not work for everybody with hidradenitis suppurativa. Your treatment will depend on how severe your symptoms are. You may need to try several treatments to find what works best for you. Part of your treatment  may include cleaning and bandaging (dressing) your wounds. You may also have to take medicines, such as the following:  Antibiotics.  Acne medicines.  Medicines to block or suppress the immune system.  A diabetes medicine (metformin) is sometimes used to treat this condition.  For women, birth control pills can sometimes help relieve symptoms. You may need surgery if you have a severe case of hidradenitis suppurativa that does not respond to medicine. Surgery may involve:  Using a laser to clear the skin and remove hair follicles.  Opening and draining deep sores.  Removing the areas of skin that are diseased and scarred. Follow these instructions at home:  Learn as much as you can about your disease, and work closely with your health care providers.  Take medicines only as directed by your health care  provider.  If you were prescribed an antibiotic medicine, finish it all even if you start to feel better.  If you are overweight, losing weight may be very helpful. Try to reach and maintain a healthy weight.  Do not use any tobacco products, including cigarettes, chewing tobacco, or electronic cigarettes. If you need help quitting, ask your health care provider.  Do not shave the areas where you get hidradenitis suppurativa.  Do not wear deodorant.  Wear loose-fitting clothes.  Try not to overheat and get sweaty.  Take a daily bleach bath as directed by your health care provider.  Fill your bathtub halfway with water.  Pour in  cup of unscented household bleach.  Soak for 5-10 minutes.  Cover sore areas with a warm, clean washcloth (compress) for 5-10 minutes. Contact a health care provider if:  You have a flare-up of hidradenitis suppurativa.  You have chills or a fever.  You are having trouble controlling your symptoms at home. This information is not intended to replace advice given to you by your health care provider. Make sure you discuss any questions you have with your health care provider. Document Released: 06/25/2004 Document Revised: 04/18/2016 Document Reviewed: 02/11/2014 Elsevier Interactive Patient Education  2017 [employer].

## 2017-04-09 ENCOUNTER — Encounter: Payer: Self-pay | Admitting: Orthopaedic Surgery

## 2017-04-09 ENCOUNTER — Ambulatory Visit (INDEPENDENT_AMBULATORY_CARE_PROVIDER_SITE_OTHER): Payer: Medicaid Other | Admitting: Orthopaedic Surgery

## 2017-04-09 VITALS — BP 100/51 | HR 80 | Temp 98.1°F | Ht 62.0 in | Wt 221.0 lb

## 2017-04-09 DIAGNOSIS — M25511 Pain in right shoulder: Secondary | ICD-10-CM

## 2017-04-09 DIAGNOSIS — G8929 Other chronic pain: Secondary | ICD-10-CM | POA: Diagnosis not present

## 2017-04-09 DIAGNOSIS — G5600 Carpal tunnel syndrome, unspecified upper limb: Secondary | ICD-10-CM

## 2017-04-09 DIAGNOSIS — M25512 Pain in left shoulder: Secondary | ICD-10-CM | POA: Diagnosis not present

## 2017-04-09 DIAGNOSIS — O26899 Other specified pregnancy related conditions, unspecified trimester: Secondary | ICD-10-CM

## 2017-04-09 NOTE — Progress Notes (Signed)
Patient AV:WUJWJXBJY:Jade Taylor, female DOB:06/26/1991, 26 y.o. NWG:[id number]  Chief Complaint  Patient presents with  . Follow-up    bilateral shoulders, bilateral carpal tunnel    HPI  Jade Taylor is a 26 y.o. female who complains of bilateral carpal tunnel pain.  She has had surgery but still has pain.  She had pregnancy recently.She uses splints and they do not help.  She has pain in median nerve distribution.   I will repeat EMGs. HPI  Body mass index is 40.42 kg/m.  ROS  Review of Systems  Constitutional:       Patient does not have Diabetes Mellitus. Patient does not have hypertension. Patient does not have COPD or shortness of breath. Patient has BMI > 35. Patient has current smoking history.  HENT: Negative for congestion.   Respiratory: Negative for cough and shortness of breath.   Endocrine: Positive for cold intolerance.  Musculoskeletal: Positive for arthralgias, myalgias and neck pain.  Allergic/Immunologic: Positive for environmental allergies.    Past Medical History:  Diagnosis Date  . Asthma   . Chronic pain syndrome   . 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 Hilda LiasKeeling  . PTSD (post-traumatic stress disorder)     Past Surgical History:  Procedure Laterality Date  . CARPAL TUNNEL RELEASE    . CESAREAN SECTION N/A 01/23/2017   Procedure: CESAREAN SECTION;  Surgeon: Willodean Rosenthalarolyn Harraway-Smith, MD;  [facility];  Service: Obstetrics;  Laterality: N/A;  . WISDOM TOOTH EXTRACTION      Family History  Problem Relation Age of Onset  . Bipolar disorder Mother   . Schizophrenia Mother   . Stroke Father   . Heart disease Maternal Grandmother   . Heart disease Paternal Grandfather     Social History Social History  Substance Use Topics  . Smoking status: Former Smoker    Packs/day: 0.50    Years: 0.50    Types: Cigarettes    Quit date: 07/02/2016  . Smokeless tobacco: Never Used  . Alcohol use No   Comment: occ    Allergies  Allergen Reactions  . Metronidazole Hives, Shortness Of Breath, Rash and Other (See Comments)    Rash, Burning sensation- was hospitalized  . Penicillins Shortness Of Breath, Itching and Rash    Has patient had a PCN reaction causing immediate rash, facial/tongue/throat swelling, SOB or lightheadedness with hypotension: No Has patient had a PCN reaction causing severe rash involving mucus membranes or skin necrosis: No Has patient had a PCN reaction that required hospitalization Yes Has patient had a PCN reaction occurring within the last 10 years: No If all of the above answers are "NO", then may proceed with Cephalosporin use.   Marland Kitchen. Amoxicillin Hives    Has patient had a PCN reaction causing immediate rash, facial/tongue/throat swelling, SOB or lightheadedness with hypotension: Yes Has patient had a PCN reaction causing severe rash involving mucus membranes or skin necrosis: No Has patient had a PCN reaction that required hospitalization No Has patient had a PCN reaction occurring within the last 10 years: No If all of the above answers are "NO", then may proceed with Cephalosporin use.     Current Outpatient Prescriptions  Medication Sig Dispense Refill  . Norethindrone-Ethinyl Estradiol-Fe Biphas (LO LOESTRIN FE) 1 MG-10 MCG / 10 MCG tablet Take 1 tablet by mouth daily. 3 Package 3   No current facility-administered medications for this visit.      Physical Exam  Blood pressure Marland Kitchen(!)  100/51, pulse 80, temperature 98.1 F (36.7 C), height 5\' 2"  (1.575 m), weight 221 lb (100.2 kg), not currently breastfeeding.  Constitutional: overall normal hygiene, normal nutrition, well developed, normal grooming, normal body habitus. Assistive device:none  Musculoskeletal: gait and station Limp none, muscle tone and strength are normal, no tremors or atrophy is present.  .  Neurological: coordination overall normal.  Deep tendon reflex/nerve stretch intact.   Sensation normal.  Cranial nerves II-XII intact.   Skin:   Normal overall no scars, lesions, ulcers or rashes. No psoriasis.  Psychiatric: Alert and oriented x 3.  Recent memory intact, remote memory unclear.  Normal mood and affect. Well groomed.  Good eye contact.  Cardiovascular: overall no swelling, no varicosities, no edema bilaterally, normal temperatures of the legs and arms, no clubbing, cyanosis and good capillary refill.  Lymphatic: palpation is normal.  Both hands have well healed scars over wrists.  NV intact.  She has negative Tinel and Phalens.  She complains of decreased sensation at times but normal today.  The patient has been educated about the nature of the problem(s) and counseled on treatment options.  The patient appeared to understand what I have discussed and is in agreement with it.  Encounter Diagnoses  Name Primary?  . Carpal tunnel syndrome during pregnancy Yes  . Chronic pain of both shoulders     PLAN Call if any problems.  Precautions discussed.  Continue current medications.   Return to clinic after EMGs   Electronically Signed Darreld Mclean, MD 5/16/201810:22 AM

## 2017-05-30 ENCOUNTER — Ambulatory Visit: Payer: Medicaid Other | Admitting: Women's Health

## 2017-06-04 ENCOUNTER — Ambulatory Visit: Payer: Medicaid Other | Admitting: Women's Health

## 2017-06-16 ENCOUNTER — Other Ambulatory Visit (HOSPITAL_COMMUNITY)
Admission: RE | Admit: 2017-06-16 | Discharge: 2017-06-16 | Disposition: A | Discharge status: Home / Self Care | Payer: Medicaid Other | Source: Ambulatory Visit | Attending: Obstetrics & Gynecology | Admitting: Obstetrics & Gynecology

## 2017-06-16 ENCOUNTER — Ambulatory Visit (INDEPENDENT_AMBULATORY_CARE_PROVIDER_SITE_OTHER): Payer: Medicaid Other | Admitting: Obstetrics & Gynecology

## 2017-06-16 ENCOUNTER — Encounter: Payer: Self-pay | Admitting: Obstetrics & Gynecology

## 2017-06-16 VITALS — BP 120/80 | HR 76 | Ht 62.0 in | Wt 222.4 lb

## 2017-06-16 DIAGNOSIS — Z01419 Encounter for gynecological examination (general) (routine) without abnormal findings: Secondary | ICD-10-CM | POA: Insufficient documentation

## 2017-06-16 DIAGNOSIS — Z309 Encounter for contraceptive management, unspecified: Secondary | ICD-10-CM

## 2017-06-16 DIAGNOSIS — L732 Hidradenitis suppurativa: Secondary | ICD-10-CM

## 2017-06-16 MED ORDER — NORETHIN-ETH ESTRAD-FE BIPHAS 1 MG-10 MCG / 10 MCG PO TABS
1.0000 | ORAL_TABLET | Freq: Every day | ORAL | 3 refills | Status: DC
Start: 2017-06-16 — End: 2018-05-29

## 2017-06-16 NOTE — Progress Notes (Signed)
Subjective:     Jade Taylor is a 26 y.o. female here for a routine exam.  Patient's last menstrual period was 05/31/2017. [id number] Birth Control Method:  OCP Lo LO estrin Menstrual Calendar(currently): generally regular started bleeding early with this pak  Current complaints: area on abdomen is healing up well.   Current acute medical issues:  Hidradenitis suppurativa   Recent Gynecologic History Patient's last menstrual period was 05/31/2017. Last Pap: 2017,  normal Last mammogram: n/a,    Past Medical History:  Diagnosis Date  . Asthma   . Chronic pain syndrome   . 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 Hilda LiasKeeling  . PTSD (post-traumatic stress disorder)     Past Surgical History:  Procedure Laterality Date  . CARPAL TUNNEL RELEASE    . CESAREAN SECTION N/A 01/23/2017   Procedure: CESAREAN SECTION;  Surgeon: Willodean Rosenthalarolyn Harraway-Smith, MD;  [facility];  Service: Obstetrics;  Laterality: N/A;  . WISDOM TOOTH EXTRACTION      OB History    Gravida Para Term Preterm AB Living   5 3 1 2 2 1    SAB TAB Ectopic Multiple Live Births   1 1   0 1      Social History   Social History  . Marital status: Single    Spouse name: N/A  . Number of children: N/A  . Years of education: N/A   Social History Main Topics  . Smoking status: Former Smoker    Packs/day: 0.50    Years: 0.50    Types: Cigarettes    Quit date: 07/02/2016  . Smokeless tobacco: Never Used  . Alcohol use No     Comment: occ  . Drug use: No     Comment: Prior pregnancy  . Sexual activity: Yes    Birth control/ protection: None   Other Topics Concern  . None   Social History Narrative  . None    Family History  Problem Relation Age of Onset  . Bipolar disorder Mother   . Schizophrenia Mother   . Diabetes Mother   . Hypertension Mother   . Stroke Father   . Heart disease Maternal Grandmother   . Heart disease Paternal Grandfather      Current  Outpatient Prescriptions:  .  Norethindrone-Ethinyl Estradiol-Fe Biphas (LO LOESTRIN FE) 1 MG-10 MCG / 10 MCG tablet, Take 1 tablet by mouth daily., Disp: 3 Package, Rfl: 3  Review of Systems  Review of Systems  Constitutional: Negative for fever, chills, weight loss, malaise/fatigue and diaphoresis.  HENT: Negative for hearing loss, ear pain, nosebleeds, congestion, sore throat, neck pain, tinnitus and ear discharge.   Eyes: Negative for blurred vision, double vision, photophobia, pain, discharge and redness.  Respiratory: Negative for cough, hemoptysis, sputum production, shortness of breath, wheezing and stridor.   Cardiovascular: Negative for chest pain, palpitations, orthopnea, claudication, leg swelling and PND.  Gastrointestinal: negative for abdominal pain. Negative for heartburn, nausea, vomiting, diarrhea, constipation, blood in stool and melena.  Genitourinary: Negative for dysuria, urgency, frequency, hematuria and flank pain.  Musculoskeletal: Negative for myalgias, back pain, joint pain and falls.  Skin: Negative for itching and rash.  Neurological: Negative for dizziness, tingling, tremors, sensory change, speech change, focal weakness, seizures, loss of consciousness, weakness and headaches.  Endo/Heme/Allergies: Negative for environmental allergies and polydipsia. Does not bruise/bleed easily.  Psychiatric/Behavioral: Negative for depression, suicidal ideas, hallucinations, memory loss and substance abuse. The patient is not nervous/anxious and does  not have insomnia.        Objective:  Blood pressure 120/80, pulse 76, height 5\' 2"  (1.575 m), weight 222 lb 6.4 oz (100.9 kg), last menstrual period 05/31/2017, not currently breastfeeding.   Physical Exam  Vitals reviewed. Constitutional: She is oriented to person, place, and time. She appears well-developed and well-nourished.  HENT:  Head: Normocephalic and atraumatic.        Right Ear: External ear normal.  Left Ear:  External ear normal.  Nose: Nose normal.  Mouth/Throat: Oropharynx is clear and moist.  Eyes: Conjunctivae and EOM are normal. Pupils are equal, round, and reactive to light. Right eye exhibits no discharge. Left eye exhibits no discharge. No scleral icterus.  Neck: Normal range of motion. Neck supple. No tracheal deviation present. No thyromegaly present.  Cardiovascular: Normal rate, regular rhythm, normal heart sounds and intact distal pulses.  Exam reveals no gallop and no friction rub.   No murmur heard. Respiratory: Effort normal and breath sounds normal. No respiratory distress. She has no wheezes. She has no rales. She exhibits no tenderness.  GI: Soft. Bowel sounds are normal. She exhibits no distension and no mass. There is no tenderness. There is no rebound and no guarding.  Genitourinary:  Breasts no masses skin changes or nipple changes bilaterally      Vulva is normal without lesions Vagina is pink moist without discharge Cervix normal in appearance and pap is done Uterus is normal size shape and contour Adnexa is negative with normal sized ovaries   Musculoskeletal: Normal range of motion. She exhibits no edema and no tenderness.  Neurological: She is alert and oriented to person, place, and time. She has normal reflexes. She displays normal reflexes. No cranial nerve deficit. She exhibits normal muscle tone. Coordination normal.  Skin: Skin is warm and dry. No rash noted. No erythema. No pallor.  Psychiatric: She has a normal mood and affect. Her behavior is normal. Judgment and thought content normal.       Medications Ordered at today's visit: No orders of the defined types were placed in this encounter.   Other orders placed at today's visit: No orders of the defined types were placed in this encounter.     Assessment:    Healthy female exam.    Plan:    Contraception: OCP (estrogen/progesterone). Follow up in: 1 year.     Return in about 1 year (around  06/16/2018) for yearly, with Dr Despina Hidden.

## 2017-06-16 NOTE — Addendum Note (Signed)
Addended by: Federico FlakeNES, PEGGY A on: 06/16/2017 12:46 PM   Modules accepted: Orders

## 2017-06-17 LAB — CYTOLOGY - PAP: Diagnosis: NEGATIVE

## 2017-06-30 NOTE — Telephone Encounter (Signed)
Sign off

## 2018-01-31 ENCOUNTER — Other Ambulatory Visit: Payer: Self-pay

## 2018-01-31 ENCOUNTER — Emergency Department (HOSPITAL_COMMUNITY): Payer: Self-pay

## 2018-01-31 ENCOUNTER — Encounter (HOSPITAL_COMMUNITY): Payer: Self-pay | Admitting: Emergency Medicine

## 2018-01-31 ENCOUNTER — Emergency Department (HOSPITAL_COMMUNITY)
Admission: EM | Admit: 2018-01-31 | Discharge: 2018-01-31 | Disposition: A | Discharge status: Home / Self Care | Payer: Self-pay | Attending: Emergency Medicine | Admitting: Emergency Medicine

## 2018-01-31 DIAGNOSIS — J45909 Unspecified asthma, uncomplicated: Secondary | ICD-10-CM | POA: Insufficient documentation

## 2018-01-31 DIAGNOSIS — M546 Pain in thoracic spine: Secondary | ICD-10-CM | POA: Insufficient documentation

## 2018-01-31 DIAGNOSIS — F17210 Nicotine dependence, cigarettes, uncomplicated: Secondary | ICD-10-CM | POA: Insufficient documentation

## 2018-01-31 LAB — COMPREHENSIVE METABOLIC PANEL
ALT: 13 U/L — ABNORMAL LOW (ref 14–54)
ANION GAP: 9 (ref 5–15)
AST: 15 U/L (ref 15–41)
Albumin: 3.6 g/dL (ref 3.5–5.0)
Alkaline Phosphatase: 67 U/L (ref 38–126)
BILIRUBIN TOTAL: 0.4 mg/dL (ref 0.3–1.2)
BUN: 9 mg/dL (ref 6–20)
CALCIUM: 9.2 mg/dL (ref 8.9–10.3)
CO2: 24 mmol/L (ref 22–32)
Chloride: 103 mmol/L (ref 101–111)
Creatinine, Ser: 0.61 mg/dL (ref 0.44–1.00)
Glucose, Bld: 112 mg/dL — ABNORMAL HIGH (ref 65–99)
Potassium: 3.6 mmol/L (ref 3.5–5.1)
SODIUM: 136 mmol/L (ref 135–145)
TOTAL PROTEIN: 7.4 g/dL (ref 6.5–8.1)

## 2018-01-31 LAB — CBC
HCT: 44 % (ref 36.0–46.0)
HEMOGLOBIN: 14.4 g/dL (ref 12.0–15.0)
MCH: 29.5 pg (ref 26.0–34.0)
MCHC: 32.7 g/dL (ref 30.0–36.0)
MCV: 90.2 fL (ref 78.0–100.0)
PLATELETS: 204 10*3/uL (ref 150–400)
RBC: 4.88 MIL/uL (ref 3.87–5.11)
RDW: 13.4 % (ref 11.5–15.5)
WBC: 10.2 10*3/uL (ref 4.0–10.5)

## 2018-01-31 LAB — URINALYSIS, ROUTINE W REFLEX MICROSCOPIC
Bilirubin Urine: NEGATIVE
Glucose, UA: NEGATIVE mg/dL
Hgb urine dipstick: NEGATIVE
Ketones, ur: NEGATIVE mg/dL
Leukocytes, UA: NEGATIVE
NITRITE: NEGATIVE
PROTEIN: NEGATIVE mg/dL
SPECIFIC GRAVITY, URINE: 1.009 (ref 1.005–1.030)
pH: 7 (ref 5.0–8.0)

## 2018-01-31 LAB — PREGNANCY, URINE: Preg Test, Ur: NEGATIVE

## 2018-01-31 LAB — LIPASE, BLOOD: Lipase: 30 U/L (ref 11–51)

## 2018-01-31 MED ORDER — KETOROLAC TROMETHAMINE 60 MG/2ML IM SOLN
60.0000 mg | Freq: Once | INTRAMUSCULAR | Status: AC
  Administered 2018-01-31: 60 mg via INTRAMUSCULAR
  Filled 2018-01-31: qty 2

## 2018-01-31 MED ORDER — CYCLOBENZAPRINE HCL 10 MG PO TABS: 10.0000 mg | ORAL_TABLET | Freq: Two times a day (BID) | ORAL | 0 refills | Status: DC | PRN

## 2018-01-31 MED ORDER — IBUPROFEN 800 MG PO TABS: 800.0000 mg | ORAL_TABLET | Freq: Three times a day (TID) | ORAL | 0 refills | Status: DC

## 2018-01-31 NOTE — ED Provider Notes (Signed)
[facility] EMERGENCY DEPARTMENT Provider Note   CSN: [id number] Arrival date & time: 01/31/18  1320     History   Chief Complaint Chief Complaint  Patient presents with  . Abdominal Pain    Jade Taylor is a 27 y.o. female.  Jade  27 year old female, she has a history of asthma, chronic pain syndrome, history of narcotic use for many years and a history of posttraumatic stress disorder.  She reports having intermittent back pain ever since receiving an epidural 1 year ago for her last pregnancy.  Since that time she has had 2 or 3 episodes per week of mid upper back pain above the epidural site, this is usually very mild but recurrent however today she woke up and was having severe pain which has been constant all day long and is located around T6-T7, it is seemingly made worse with bending or rotating and has radiation around her bilateral ribs towards the chest.  She denies any shortness of breath, cough, fever or swelling of the legs and denies any lower back pain numbness or weakness of the legs, urinary frequency dysuria hematuria retention or incontinence.  There is been no history of IV drug use, there is no history of cancer, no history of trauma.  At this time the pain is 9 out of 10.  Past Medical History:  Diagnosis Date  . Asthma   . Chronic pain syndrome   . 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 Hilda Lias  . PTSD (post-traumatic stress disorder)     Patient Active Problem List   Diagnosis Date Noted  . 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 Marijuana use 08/07/2016  . History of fetal demise x 2, not currently pregnant 08/07/2016  . Chronic pain syndrome   . PTSD (post-traumatic stress disorder)   . Asthma   . Bilateral shoulder pain 01/25/2016  . CARPAL TUNNEL SYNDROME 02/01/2009    Past Surgical History:  Procedure Laterality Date  . CARPAL TUNNEL RELEASE    . CESAREAN SECTION N/A 01/23/2017   Procedure: CESAREAN SECTION;   Surgeon: Willodean Rosenthal, MD;  Location: [facility] BIRTHING SUIT[facility];  Service: Obstetrics;  Laterality: N/A;  . WISDOM TOOTH EXTRACTION      OB History    Gravida Para Term Preterm AB Living   5 3 1 2 2 1    SAB TAB Ectopic Multiple Live Births   1 1   0 1       Home Medications    Prior to Admission medications   Medication Sig Start Date End Date Taking? Authorizing Provider  Norethindrone-Ethinyl Estradiol-Fe Biphas (LO LOESTRIN FE) 1 MG-10 MCG / 10 MCG tablet Take 1 tablet by mouth daily. 06/16/17  Yes Lazaro Arms, MD  silver sulfADIAZINE (SILVADENE) 1 % cream Apply 1 application topically daily. Affected areas   Yes [provider]  cyclobenzaprine (FLEXERIL) 10 MG tablet Take 1 tablet (10 mg total) by mouth 2 (two) times daily as needed for muscle spasms. 01/31/18   Eber Hong, MD  ibuprofen (ADVIL,MOTRIN) 800 MG tablet Take 1 tablet (800 mg total) by mouth 3 (three) times daily. 01/31/18   Eber Hong, MD    Family History Family History  Problem Relation Age of Onset  . Bipolar disorder Mother   . Schizophrenia Mother   . Diabetes Mother   . Hypertension Mother   . Stroke Father   . Heart disease Maternal Grandmother   . Heart disease Paternal  Grandfather     Social History Social History   Tobacco Use  . Smoking status: Light Tobacco Smoker    Packs/day: 0.50    Years: 0.50    Pack years: 0.25    Types: Cigarettes    Last attempt to quit: 07/02/2016    Years since quitting: 1.5  . Smokeless tobacco: Never Used  Substance Use Topics  . Alcohol use: No    Comment: occ  . Drug use: No    Comment: Prior pregnancy     Allergies   Metronidazole; Penicillins; and Amoxicillin   Review of Systems Review of Systems  All other systems reviewed and are negative.    Physical Exam Updated Vital Signs BP 112/67 (BP Location: Right Arm)   Pulse 87   Temp 98.3 F (36.8 C) (Oral)   Resp 16   Ht 5\' 2"  (1.575 m)   Wt 95.3 kg (210 lb)   LMP  01/03/2018   SpO2 100%   BMI 38.41 kg/m   Physical Exam  Constitutional: She appears well-developed and well-nourished. No distress.  HENT:  Head: Normocephalic and atraumatic.  Mouth/Throat: Oropharynx is clear and moist. No oropharyngeal exudate.  Eyes: Conjunctivae and EOM are normal. Pupils are equal, round, and reactive to light. Right eye exhibits no discharge. Left eye exhibits no discharge. No scleral icterus (bilatearl mid axillary ttp without rash or masses).  Neck: Normal range of motion. Neck supple. No JVD present. No thyromegaly present.  Cardiovascular: Normal rate, regular rhythm, normal heart sounds and intact distal pulses. Exam reveals no gallop and no friction rub.  No murmur heard. Pulmonary/Chest: Effort normal and breath sounds normal. No respiratory distress. She has no wheezes. She has no rales. She exhibits tenderness.  Abdominal: Soft. Bowel sounds are normal. She exhibits no distension and no mass. There is no tenderness.  Musculoskeletal: Normal range of motion. She exhibits tenderness ( focal ttp in the mid to upper back around T5-T8 - midline and paraspinal muscles). She exhibits no edema.  Lymphadenopathy:    She has no cervical adenopathy.  Neurological: She is alert. Coordination normal.  Neurologic exam:  Speech clear, pupils equal round reactive to light, extraocular movements intact  Normal peripheral visual fields Cranial nerves III through XII normal including no facial droop Follows commands, moves all extremities x4, normal strength to bilateral upper and lower extremities at all major muscle groups including grip Sensation normal to light touch and pinprick Coordination intact, no limb ataxia, finger-nose-finger normal Rapid alternating movements normal No pronator drift Gait normal   Skin: Skin is warm and dry. No rash noted. No erythema.  Psychiatric: She has a normal mood and affect. Her behavior is normal.  Nursing note and vitals  reviewed.    ED Treatments / Results  Labs (all labs ordered are listed, but only abnormal results are displayed) Labs Reviewed  COMPREHENSIVE METABOLIC PANEL - Abnormal; Notable for the following components:      Result Value   Glucose, Bld 112 (*)    ALT 13 (*)    All other components within normal limits  URINALYSIS, ROUTINE W REFLEX MICROSCOPIC - Abnormal; Notable for the following components:   Color, Urine STRAW (*)    All other components within normal limits  LIPASE, BLOOD  CBC  PREGNANCY, URINE     Radiology Dg Chest 2 View  Result Date: 01/31/2018 CLINICAL DATA:  Chest pain for several hours, initial encounter EXAM: CHEST - 2 VIEW COMPARISON:  07/10/2014 FINDINGS: The heart  size and mediastinal contours are within normal limits. Both lungs are clear. The visualized skeletal structures are unremarkable. IMPRESSION: No active cardiopulmonary disease. Electronically Signed   By: Alcide Clever M.D.   On: 01/31/2018 17:20   Dg Thoracic Spine 2 View  Result Date: 01/31/2018 CLINICAL DATA:  Back pain common no known injury, initial encounter EXAM: THORACIC SPINE 2 VIEWS COMPARISON:  None. FINDINGS: There is no evidence of thoracic spine fracture. Alignment is normal. No other significant bone abnormalities are identified. IMPRESSION: No acute abnormality noted. Electronically Signed   By: Alcide Clever M.D.   On: 01/31/2018 17:19    Procedures Procedures (including critical care time)  Medications Ordered in ED Medications  ketorolac (TORADOL) injection 60 mg (60 mg Intramuscular Given 01/31/18 1631)     Initial Impression / Assessment and Plan / ED Course  I have reviewed the triage vital signs and the nursing notes.  Pertinent labs & imaging results that were available during my care of the patient were reviewed by me and considered in my medical decision making (see chart for details).    Has midline ttp - thoracic spine and CXR - pt is low risk for other findings of  spine an the pain is not daily - no neuro sx - toradol, imaging - pt agrteeable.  Became very hostile that x-ray was not done immediately on her arrival.  She was given intramuscular Toradol, states it did not help, she was ambulatory back and forth to the desk complaining about the speed of care.  We expedited her x-rays after urine pregnancy came back negative.  X-rays negative for acute findings, patient informed, she will be treated with anti-inflammatories and a muscle relaxant,  For discharge  Final Clinical Impressions(s) / ED Diagnoses   Final diagnoses:  Acute midline thoracic back pain    ED Discharge Orders        Ordered    ibuprofen (ADVIL,MOTRIN) 800 MG tablet  3 times daily     01/31/18 1724    cyclobenzaprine (FLEXERIL) 10 MG tablet  2 times daily PRN     01/31/18 1724       Eber Hong, MD 01/31/18 1725

## 2018-01-31 NOTE — ED Triage Notes (Signed)
Patient complains of abdominal pain that that started this morning. States the pain starts in her abdomin and radiates towards her back. She states nothing she does makes it better. She states nothing seems to make it worse. The pain is "just there." Patient is tearful in triage.

## 2018-01-31 NOTE — ED Notes (Addendum)
Per Thurston HoleAnne, RN: Pt shouting in department   Demands to leave as she has been here 4 hours (3.4 hrs)  Dr Hyacinth MeekerMiller speaking with  As pt begins to sign AMA paperwork, she is asked if she wishes to go to rad and is going with Rad tech

## 2018-01-31 NOTE — ED Notes (Signed)
Pt to desk shouting  Desiring to go  Dr Hyacinth MeekerMiller discussing w pt

## 2018-01-31 NOTE — Discharge Instructions (Signed)
Your xrays are normal Take Ibuprofen every 8 hours as needed Flexeril for muscle strain and spasm ER for increased pain, weakness, numbness, or fevers or difficulty breathing

## 2018-03-09 ENCOUNTER — Other Ambulatory Visit: Payer: Self-pay | Admitting: Obstetrics & Gynecology

## 2018-05-29 ENCOUNTER — Telehealth: Payer: Self-pay | Admitting: Advanced Practice Midwife

## 2018-05-29 ENCOUNTER — Telehealth: Payer: Self-pay | Admitting: Obstetrics & Gynecology

## 2018-05-29 ENCOUNTER — Other Ambulatory Visit: Payer: Self-pay | Admitting: Obstetrics & Gynecology

## 2018-05-29 MED ORDER — NORETHIN-ETH ESTRAD-FE BIPHAS 1 MG-10 MCG / 10 MCG PO TABS: 1.0000 | ORAL_TABLET | Freq: Every day | ORAL | 3 refills | Status: DC

## 2018-05-29 NOTE — Telephone Encounter (Signed)
I refilled it  As far as I know I have not received a refill request til this one

## 2018-05-29 NOTE — Telephone Encounter (Signed)
[facility] called stating [facility]. Pt would like the refill sent today. Please contact pt

## 2018-06-01 NOTE — Telephone Encounter (Signed)
Meds ordered this encounter  Medications  . Norethindrone-Ethinyl Estradiol-Fe Biphas (LO LOESTRIN FE) 1 MG-10 MCG / 10 MCG tablet    Sig: Take 1 tablet by mouth daily.    Dispense:  84 tablet    Refill:  3

## 2018-08-06 ENCOUNTER — Telehealth: Payer: Self-pay | Admitting: Obstetrics & Gynecology

## 2018-08-06 NOTE — Telephone Encounter (Signed)
Pt reports that she has been having periods every two weeks. She is on lo lo estrin. She has been on it since her son was born over a year ago. She takes pill at the same time every day. She never had bleeding between pills prior to this. It has been happening for the past two months. She denies any new sexual partners. No vaginal discharge. Advised patient to make an appointment to be seen for Pap/Physical as she has not been seen since 06/16/17. Patient agreeable.

## 2018-08-06 NOTE — Telephone Encounter (Signed)
Has question about bleeding on Birth Control

## 2018-08-06 NOTE — Telephone Encounter (Signed)
LMOVM returning pts call.  

## 2018-08-17 ENCOUNTER — Encounter (HOSPITAL_COMMUNITY): Payer: Self-pay | Admitting: Emergency Medicine

## 2018-08-17 ENCOUNTER — Emergency Department (HOSPITAL_COMMUNITY)
Admission: EM | Admit: 2018-08-17 | Discharge: 2018-08-17 | Disposition: A | Discharge status: Home / Self Care | Payer: Self-pay | Attending: General Surgery | Admitting: General Surgery

## 2018-08-17 ENCOUNTER — Emergency Department (HOSPITAL_COMMUNITY): Payer: Self-pay | Admitting: Anesthesiology

## 2018-08-17 ENCOUNTER — Other Ambulatory Visit: Payer: Self-pay

## 2018-08-17 ENCOUNTER — Encounter (HOSPITAL_COMMUNITY)
Admission: EM | Disposition: A | Discharge status: Home / Self Care | Payer: Self-pay | Source: Home / Self Care | Attending: Emergency Medicine

## 2018-08-17 ENCOUNTER — Emergency Department (HOSPITAL_COMMUNITY): Payer: Self-pay

## 2018-08-17 DIAGNOSIS — Z880 Allergy status to penicillin: Secondary | ICD-10-CM | POA: Insufficient documentation

## 2018-08-17 DIAGNOSIS — R1011 Right upper quadrant pain: Secondary | ICD-10-CM

## 2018-08-17 DIAGNOSIS — K810 Acute cholecystitis: Secondary | ICD-10-CM

## 2018-08-17 DIAGNOSIS — K8000 Calculus of gallbladder with acute cholecystitis without obstruction: Secondary | ICD-10-CM

## 2018-08-17 DIAGNOSIS — K8010 Calculus of gallbladder with chronic cholecystitis without obstruction: Secondary | ICD-10-CM | POA: Insufficient documentation

## 2018-08-17 DIAGNOSIS — Z79899 Other long term (current) drug therapy: Secondary | ICD-10-CM | POA: Insufficient documentation

## 2018-08-17 DIAGNOSIS — F17210 Nicotine dependence, cigarettes, uncomplicated: Secondary | ICD-10-CM | POA: Insufficient documentation

## 2018-08-17 DIAGNOSIS — Z881 Allergy status to other antibiotic agents status: Secondary | ICD-10-CM | POA: Insufficient documentation

## 2018-08-17 DIAGNOSIS — G894 Chronic pain syndrome: Secondary | ICD-10-CM | POA: Insufficient documentation

## 2018-08-17 DIAGNOSIS — F4310 Post-traumatic stress disorder, unspecified: Secondary | ICD-10-CM | POA: Insufficient documentation

## 2018-08-17 DIAGNOSIS — J45909 Unspecified asthma, uncomplicated: Secondary | ICD-10-CM | POA: Insufficient documentation

## 2018-08-17 HISTORY — PX: CHOLECYSTECTOMY: SHX55

## 2018-08-17 LAB — COMPREHENSIVE METABOLIC PANEL
ALT: 12 U/L (ref 0–44)
ANION GAP: 10 (ref 5–15)
AST: 14 U/L — ABNORMAL LOW (ref 15–41)
Albumin: 3.8 g/dL (ref 3.5–5.0)
Alkaline Phosphatase: 64 U/L (ref 38–126)
BILIRUBIN TOTAL: 0.2 mg/dL — AB (ref 0.3–1.2)
BUN: 10 mg/dL (ref 6–20)
CO2: 22 mmol/L (ref 22–32)
Calcium: 9.4 mg/dL (ref 8.9–10.3)
Chloride: 107 mmol/L (ref 98–111)
Creatinine, Ser: 0.72 mg/dL (ref 0.44–1.00)
GFR calc Af Amer: >60 mL/min (ref >60)
Glucose, Bld: 123 mg/dL — ABNORMAL HIGH (ref 70–99)
POTASSIUM: 3.4 mmol/L — AB (ref 3.5–5.1)
Sodium: 139 mmol/L (ref 135–145)
Total Protein: 7.5 g/dL (ref 6.5–8.1)

## 2018-08-17 LAB — URINALYSIS, ROUTINE W REFLEX MICROSCOPIC
Bilirubin Urine: NEGATIVE
Glucose, UA: NEGATIVE mg/dL
Hgb urine dipstick: NEGATIVE
KETONES UR: NEGATIVE mg/dL
LEUKOCYTES UA: NEGATIVE
NITRITE: NEGATIVE
PH: 6 (ref 5.0–8.0)
Protein, ur: NEGATIVE mg/dL
Specific Gravity, Urine: 1.014 (ref 1.005–1.030)

## 2018-08-17 LAB — CBC WITH DIFFERENTIAL/PLATELET
BASOS ABS: 0 10*3/uL (ref 0.0–0.1)
Basophils Relative: 0 %
Eosinophils Absolute: 0.1 10*3/uL (ref 0.0–0.7)
Eosinophils Relative: 1 %
HEMATOCRIT: 43.9 % (ref 36.0–46.0)
HEMOGLOBIN: 14.7 g/dL (ref 12.0–15.0)
Lymphocytes Relative: 21 %
Lymphs Abs: 2.4 10*3/uL (ref 0.7–4.0)
MCH: 30 pg (ref 26.0–34.0)
MCHC: 33.5 g/dL (ref 30.0–36.0)
MCV: 89.6 fL (ref 78.0–100.0)
Monocytes Absolute: 0.7 10*3/uL (ref 0.1–1.0)
Monocytes Relative: 6 %
NEUTROS ABS: 8.3 10*3/uL — AB (ref 1.7–7.7)
NEUTROS PCT: 72 %
Platelets: 195 10*3/uL (ref 150–400)
RBC: 4.9 MIL/uL (ref 3.87–5.11)
RDW: 13 % (ref 11.5–15.5)
WBC: 11.5 10*3/uL — AB (ref 4.0–10.5)

## 2018-08-17 LAB — POC URINE PREG, ED: Preg Test, Ur: NEGATIVE

## 2018-08-17 LAB — LIPASE, BLOOD: LIPASE: 40 U/L (ref 11–51)

## 2018-08-17 SURGERY — LAPAROSCOPIC CHOLECYSTECTOMY
Anesthesia: General | Site: Abdomen

## 2018-08-17 MED ORDER — ONDANSETRON HCL 4 MG/2ML IJ SOLN
INTRAMUSCULAR | Status: DC | PRN
  Administered 2018-08-17: 4 mg via INTRAVENOUS

## 2018-08-17 MED ORDER — GLYCOPYRROLATE 0.2 MG/ML IJ SOLN
INTRAMUSCULAR | Status: AC
  Filled 2018-08-17: qty 1

## 2018-08-17 MED ORDER — LACTATED RINGERS IV SOLN
INTRAVENOUS | Status: DC | PRN
  Administered 2018-08-17: 12:00:00 via INTRAVENOUS

## 2018-08-17 MED ORDER — SUCCINYLCHOLINE CHLORIDE 20 MG/ML IJ SOLN
INTRAMUSCULAR | Status: DC | PRN
  Administered 2018-08-17: 140 mg via INTRAVENOUS

## 2018-08-17 MED ORDER — GLYCOPYRROLATE 0.2 MG/ML IJ SOLN
INTRAMUSCULAR | Status: DC | PRN
  Administered 2018-08-17: .6 mg via INTRAVENOUS

## 2018-08-17 MED ORDER — DIPHENHYDRAMINE HCL 50 MG/ML IJ SOLN
25.0000 mg | Freq: Once | INTRAMUSCULAR | Status: AC
  Administered 2018-08-17: 25 mg via INTRAVENOUS
  Filled 2018-08-17: qty 1

## 2018-08-17 MED ORDER — BUPIVACAINE IN DEXTROSE 0.75-8.25 % IT SOLN
INTRATHECAL | Status: AC
  Filled 2018-08-17: qty 2

## 2018-08-17 MED ORDER — SUFENTANIL CITRATE 50 MCG/ML IV SOLN
INTRAVENOUS | Status: DC | PRN
  Administered 2018-08-17: 5 ug via INTRAVENOUS
  Administered 2018-08-17: 10 ug via INTRAVENOUS
  Administered 2018-08-17 (×3): 5 ug via INTRAVENOUS

## 2018-08-17 MED ORDER — NEOSTIGMINE METHYLSULFATE 10 MG/10ML IV SOLN
INTRAVENOUS | Status: AC
  Filled 2018-08-17: qty 2

## 2018-08-17 MED ORDER — PROMETHAZINE HCL 25 MG/ML IJ SOLN: 6.2500 mg | INTRAMUSCULAR | Status: DC | PRN

## 2018-08-17 MED ORDER — MIDAZOLAM HCL 2 MG/2ML IJ SOLN
INTRAMUSCULAR | Status: DC | PRN
  Administered 2018-08-17: 2 mg via INTRAVENOUS

## 2018-08-17 MED ORDER — NEOSTIGMINE METHYLSULFATE 10 MG/10ML IV SOLN
INTRAVENOUS | Status: DC | PRN
  Administered 2018-08-17: 4 mg via INTRAVENOUS

## 2018-08-17 MED ORDER — MEPERIDINE HCL 50 MG/ML IJ SOLN: 6.2500 mg | INTRAMUSCULAR | Status: DC | PRN

## 2018-08-17 MED ORDER — POVIDONE-IODINE 10 % OINT PACKET
TOPICAL_OINTMENT | CUTANEOUS | Status: DC | PRN
  Administered 2018-08-17: 1 via TOPICAL

## 2018-08-17 MED ORDER — DEXAMETHASONE SODIUM PHOSPHATE 4 MG/ML IJ SOLN
INTRAMUSCULAR | Status: AC
  Filled 2018-08-17: qty 1

## 2018-08-17 MED ORDER — DEXAMETHASONE SODIUM PHOSPHATE 4 MG/ML IJ SOLN
INTRAMUSCULAR | Status: DC | PRN
  Administered 2018-08-17: 4 mg via INTRAVENOUS

## 2018-08-17 MED ORDER — MIDAZOLAM HCL 2 MG/2ML IJ SOLN
INTRAMUSCULAR | Status: AC
  Filled 2018-08-17: qty 2

## 2018-08-17 MED ORDER — KETOROLAC TROMETHAMINE 30 MG/ML IJ SOLN
30.0000 mg | Freq: Once | INTRAMUSCULAR | Status: AC
  Administered 2018-08-17: 30 mg via INTRAVENOUS
  Filled 2018-08-17: qty 1

## 2018-08-17 MED ORDER — FENTANYL CITRATE (PF) 100 MCG/2ML IJ SOLN
INTRAMUSCULAR | Status: AC
  Filled 2018-08-17: qty 2

## 2018-08-17 MED ORDER — SUCCINYLCHOLINE CHLORIDE 20 MG/ML IJ SOLN
INTRAMUSCULAR | Status: AC
  Filled 2018-08-17: qty 1

## 2018-08-17 MED ORDER — MORPHINE SULFATE (PF) 4 MG/ML IV SOLN
4.0000 mg | Freq: Once | INTRAVENOUS | Status: AC
  Administered 2018-08-17: 4 mg via INTRAVENOUS
  Filled 2018-08-17: qty 1

## 2018-08-17 MED ORDER — HYDROMORPHONE HCL 1 MG/ML IJ SOLN
0.2500 mg | INTRAMUSCULAR | Status: DC | PRN
  Administered 2018-08-17 (×2): 0.5 mg via INTRAVENOUS
  Filled 2018-08-17 (×2): qty 0.5

## 2018-08-17 MED ORDER — SODIUM CHLORIDE 0.9 % IV SOLN
INTRAVENOUS | Status: DC
  Administered 2018-08-17 (×2): via INTRAVENOUS

## 2018-08-17 MED ORDER — ROCURONIUM BROMIDE 100 MG/10ML IV SOLN
INTRAVENOUS | Status: DC | PRN
  Administered 2018-08-17: 30 mg via INTRAVENOUS

## 2018-08-17 MED ORDER — SODIUM CHLORIDE 0.9 % IJ SOLN
INTRAMUSCULAR | Status: AC
  Filled 2018-08-17: qty 10

## 2018-08-17 MED ORDER — SODIUM CHLORIDE 0.9 % IR SOLN
Status: DC | PRN
  Administered 2018-08-17: 1000 mL

## 2018-08-17 MED ORDER — BUPIVACAINE LIPOSOME 1.3 % IJ SUSP
INTRAMUSCULAR | Status: DC | PRN
  Administered 2018-08-17: 20 mL

## 2018-08-17 MED ORDER — KETOROLAC TROMETHAMINE 30 MG/ML IJ SOLN
INTRAMUSCULAR | Status: DC | PRN
  Administered 2018-08-17: 30 mg via INTRAVENOUS

## 2018-08-17 MED ORDER — CHLORHEXIDINE GLUCONATE CLOTH 2 % EX PADS: 6.0000 | MEDICATED_PAD | Freq: Once | CUTANEOUS | Status: DC

## 2018-08-17 MED ORDER — SUGAMMADEX SODIUM 200 MG/2ML IV SOLN
INTRAVENOUS | Status: AC
  Filled 2018-08-17: qty 2

## 2018-08-17 MED ORDER — BUPIVACAINE LIPOSOME 1.3 % IJ SUSP
INTRAMUSCULAR | Status: AC
  Filled 2018-08-17: qty 20

## 2018-08-17 MED ORDER — CIPROFLOXACIN IN D5W 400 MG/200ML IV SOLN
400.0000 mg | Freq: Once | INTRAVENOUS | Status: AC
  Administered 2018-08-17: 400 mg via INTRAVENOUS
  Filled 2018-08-17: qty 200

## 2018-08-17 MED ORDER — LACTATED RINGERS IV SOLN: INTRAVENOUS | Status: DC

## 2018-08-17 MED ORDER — FENTANYL CITRATE (PF) 100 MCG/2ML IJ SOLN
INTRAMUSCULAR | Status: DC | PRN
  Administered 2018-08-17: 100 ug via INTRAVENOUS

## 2018-08-17 MED ORDER — LABETALOL HCL 5 MG/ML IV SOLN
INTRAVENOUS | Status: AC
  Filled 2018-08-17: qty 4

## 2018-08-17 MED ORDER — ONDANSETRON HCL 4 MG/2ML IJ SOLN
4.0000 mg | Freq: Once | INTRAMUSCULAR | Status: AC
  Administered 2018-08-17: 4 mg via INTRAVENOUS
  Filled 2018-08-17: qty 2

## 2018-08-17 MED ORDER — GLYCOPYRROLATE 0.2 MG/ML IJ SOLN
INTRAMUSCULAR | Status: AC
  Filled 2018-08-17: qty 3

## 2018-08-17 MED ORDER — HYDROMORPHONE HCL 1 MG/ML IJ SOLN
1.0000 mg | Freq: Once | INTRAMUSCULAR | Status: AC
  Administered 2018-08-17: 1 mg via INTRAVENOUS
  Filled 2018-08-17: qty 1

## 2018-08-17 MED ORDER — SUFENTANIL CITRATE 50 MCG/ML IV SOLN
INTRAVENOUS | Status: AC
  Filled 2018-08-17: qty 1

## 2018-08-17 MED ORDER — OXYCODONE HCL 5 MG PO TABS: 5.0000 mg | ORAL_TABLET | ORAL | 0 refills | Status: DC | PRN

## 2018-08-17 MED ORDER — HEMOSTATIC AGENTS (NO CHARGE) OPTIME
TOPICAL | Status: DC | PRN
  Administered 2018-08-17: 1 via TOPICAL

## 2018-08-17 MED ORDER — PROPOFOL 10 MG/ML IV BOLUS
INTRAVENOUS | Status: DC | PRN
  Administered 2018-08-17: 20 mg via INTRAVENOUS
  Administered 2018-08-17: 180 mg via INTRAVENOUS
  Administered 2018-08-17: 20 mg via INTRAVENOUS

## 2018-08-17 MED ORDER — HYDROCODONE-ACETAMINOPHEN 7.5-325 MG PO TABS: 1.0000 | ORAL_TABLET | Freq: Once | ORAL | Status: DC | PRN

## 2018-08-17 MED ORDER — POVIDONE-IODINE 10 % EX OINT
TOPICAL_OINTMENT | CUTANEOUS | Status: AC
  Filled 2018-08-17: qty 1

## 2018-08-17 SURGICAL SUPPLY — 49 items
APPLIER CLIP ROT 10 11.4 M/L (STAPLE) ×3
BAG RETRIEVAL 10 (BASKET) ×1
BAG RETRIEVAL 10MM (BASKET) ×1
CHLORAPREP W/TINT 26ML (MISCELLANEOUS) ×3 IMPLANT
CLIP APPLIE ROT 10 11.4 M/L (STAPLE) ×1 IMPLANT
CLOTH BEACON ORANGE TIMEOUT ST (SAFETY) ×3 IMPLANT
COVER LIGHT HANDLE STERIS (MISCELLANEOUS) ×6 IMPLANT
ELECT REM PT RETURN 9FT ADLT (ELECTROSURGICAL) ×3
ELECTRODE REM PT RTRN 9FT ADLT (ELECTROSURGICAL) ×1 IMPLANT
FILTER SMOKE EVAC LAPAROSHD (FILTER) ×3 IMPLANT
GLOVE BIOGEL PI IND STRL 6.5 (GLOVE) ×1 IMPLANT
GLOVE BIOGEL PI IND STRL 7.0 (GLOVE) ×1 IMPLANT
GLOVE BIOGEL PI INDICATOR 6.5 (GLOVE) ×2
GLOVE BIOGEL PI INDICATOR 7.0 (GLOVE) ×2
GLOVE ECLIPSE 6.5 STRL STRAW (GLOVE) ×3 IMPLANT
GLOVE SURG SS PI 6.5 STRL IVOR (GLOVE) ×3 IMPLANT
GLOVE SURG SS PI 7.5 STRL IVOR (GLOVE) ×3 IMPLANT
GOWN STRL REUS W/ TWL XL LVL3 (GOWN DISPOSABLE) ×1 IMPLANT
GOWN STRL REUS W/TWL LRG LVL3 (GOWN DISPOSABLE) ×6 IMPLANT
GOWN STRL REUS W/TWL XL LVL3 (GOWN DISPOSABLE) ×2
HEMOSTAT SNOW SURGICEL 2X4 (HEMOSTASIS) ×3 IMPLANT
INST SET LAPROSCOPIC AP (KITS) ×3 IMPLANT
IV NS IRRIG 3000ML ARTHROMATIC (IV SOLUTION) IMPLANT
KIT TURNOVER KIT A (KITS) ×3 IMPLANT
MANIFOLD NEPTUNE II (INSTRUMENTS) ×3 IMPLANT
NEEDLE HYPO 18GX1.5 BLUNT FILL (NEEDLE) ×3 IMPLANT
NEEDLE HYPO 22GX1.5 SAFETY (NEEDLE) ×3 IMPLANT
NEEDLE INSUFFLATION 14GA 120MM (NEEDLE) ×3 IMPLANT
NS IRRIG 1000ML POUR BTL (IV SOLUTION) ×3 IMPLANT
PACK LAP CHOLE LZT030E (CUSTOM PROCEDURE TRAY) ×3 IMPLANT
PAD ARMBOARD 7.5X6 YLW CONV (MISCELLANEOUS) ×3 IMPLANT
SET BASIN LINEN APH (SET/KITS/TRAYS/PACK) ×3 IMPLANT
SET TUBE IRRIG SUCTION NO TIP (IRRIGATION / IRRIGATOR) IMPLANT
SLEEVE ENDOPATH XCEL 5M (ENDOMECHANICALS) ×3 IMPLANT
SPONGE GAUZE 2X2 8PLY STER LF (GAUZE/BANDAGES/DRESSINGS) ×1
SPONGE GAUZE 2X2 8PLY STRL LF (GAUZE/BANDAGES/DRESSINGS) ×2 IMPLANT
STAPLER VISISTAT (STAPLE) ×3 IMPLANT
SUT VICRYL 0 UR6 27IN ABS (SUTURE) ×3 IMPLANT
SYR 20CC LL (SYRINGE) ×3 IMPLANT
SYS BAG RETRIEVAL 10MM (BASKET) ×1
SYSTEM BAG RETRIEVAL 10MM (BASKET) ×1 IMPLANT
TAPE PAPER 2X10 WHT MICROPORE (GAUZE/BANDAGES/DRESSINGS) ×3 IMPLANT
TROCAR ENDO BLADELESS 11MM (ENDOMECHANICALS) ×3 IMPLANT
TROCAR XCEL NON-BLD 5MMX100MML (ENDOMECHANICALS) ×3 IMPLANT
TROCAR XCEL UNIV SLVE 11M 100M (ENDOMECHANICALS) ×3 IMPLANT
TUBE CONNECTING 12'X1/4 (SUCTIONS) ×1
TUBE CONNECTING 12X1/4 (SUCTIONS) ×2 IMPLANT
TUBING INSUFFLATION (TUBING) ×3 IMPLANT
WARMER LAPAROSCOPE (MISCELLANEOUS) ×3 IMPLANT

## 2018-08-17 NOTE — Discharge Instructions (Signed)
Laparoscopic Cholecystectomy, Care After °This sheet gives you information about how to care for yourself after your procedure. Your health care provider may also give you more specific instructions. If you have problems or questions, contact your health care provider. °What can I expect after the procedure? °After the procedure, it is common to have: °· Pain at your incision sites. You will be given medicines to control this pain. °· Mild nausea or vomiting. °· Bloating and possible shoulder pain from the air-like gas that was used during the procedure. ° °Follow these instructions at home: °Incision care ° °· Follow instructions from your health care provider about how to take care of your incisions. Make sure you: °? Wash your hands with soap and water before you change your bandage (dressing). If soap and water are not available, use hand sanitizer. °? Change your dressing as told by your health care provider. °? Leave stitches (sutures), skin glue, or adhesive strips in place. These skin closures may need to be in place for 2 weeks or longer. If adhesive strip edges start to loosen and curl up, you may trim the loose edges. Do not remove adhesive strips completely unless your health care provider tells you to do that. °· Do not take baths, swim, or use a hot tub until your health care provider approves. Ask your health care provider if you can take showers. You may only be allowed to take sponge baths for bathing. °· Check your incision area every day for signs of infection. Check for: °? More redness, swelling, or pain. °? More fluid or blood. °? Warmth. °? Pus or a bad smell. °Activity °· Do not drive or use heavy machinery while taking prescription pain medicine. °· Do not lift anything that is heavier than 10 lb (4.5 kg) until your health care provider approves. °· Do not play contact sports until your health care provider approves. °· Do not drive for 24 hours if you were given a medicine to help you relax  (sedative). °· Rest as needed. Do not return to work or school until your health care provider approves. °General instructions °· Take over-the-counter and prescription medicines only as told by your health care provider. °· To prevent or treat constipation while you are taking prescription pain medicine, your health care provider may recommend that you: °? Drink enough fluid to keep your urine clear or pale yellow. °? Take over-the-counter or prescription medicines. °? Eat foods that are high in fiber, such as fresh fruits and vegetables, whole grains, and beans. °? Limit foods that are high in fat and processed sugars, such as fried and sweet foods. °Contact a health care provider if: °· You develop a rash. °· You have more redness, swelling, or pain around your incisions. °· You have more fluid or blood coming from your incisions. °· Your incisions feel warm to the touch. °· You have pus or a bad smell coming from your incisions. °· You have a fever. °· One or more of your incisions breaks open. °Get help right away if: °· You have trouble breathing. °· You have chest pain. °· You have increasing pain in your shoulders. °· You faint or feel dizzy when you stand. °· You have severe pain in your abdomen. °· You have nausea or vomiting that lasts for more than one day. °· You have leg pain. °This information is not intended to replace advice given to you by your health care provider. Make sure you discuss any questions you   have with your health care provider. °Document Released: 11/11/2005 Document Revised: 06/01/2016 Document Reviewed: 04/29/2016 °Elsevier Interactive Patient Education © 2018 [employer] °PATIENT INSTRUCTIONS °POST-ANESTHESIA ° °IMMEDIATELY FOLLOWING SURGERY:  Do not drive or operate machinery for the first twenty four hours after surgery.  Do not make any important decisions for twenty four hours after surgery or while taking narcotic pain medications or sedatives.  If you develop intractable  nausea and vomiting or a severe headache please notify your doctor immediately. ° °FOLLOW-UP:  Please make an appointment with your surgeon as instructed. You do not need to follow up with anesthesia unless specifically instructed to do so. ° °WOUND CARE INSTRUCTIONS (if applicable):  Keep a dry clean dressing on the anesthesia/puncture wound site if there is drainage.  Once the wound has quit draining you may leave it open to air.  Generally you should leave the bandage intact for twenty four hours unless there is drainage.  If the epidural site drains for more than 36-48 hours please call the anesthesia department. ° °QUESTIONS?:  Please feel free to call your physician or the hospital operator if you have any questions, and they will be happy to assist you.    ° ° ° ° °

## 2018-08-17 NOTE — ED Provider Notes (Signed)
[facility]ER[facility]ENCY DEPARTMENT Provider Note   CSN: [id number] Arrival date & time: 08/17/18  0435     History   Chief Complaint Chief Complaint  Patient presents with  . Abdominal Pain    HPI Jade Taylor is a 27 y.o. female.  The history is provided by the patient.  She has history of asthma, posttraumatic stress disorder, chronic pain and comes in because of epigastric pain radiating to the back.  She was awakened by this pain.  There is associated nausea and vomiting.  Pain is slightly improved after vomiting.  She denies diarrhea.  She denies fever or chills.  She has been having similar episodes over the last 4 months, always waking her up from sleep.  She had pork chops for dinner at about 6 PM.  She denies eating anything following that.  She denies fatty food intolerance.  She is on birth control pills, youngest child is 4418 months old.  Past Medical History:  Diagnosis Date  . Asthma   . Chronic pain syndrome   . 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 Hilda LiasKeeling  . PTSD (post-traumatic stress disorder)     Patient Active Problem List   Diagnosis Date Noted  . 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 Marijuana use 08/07/2016  . History of fetal demise x 2, not currently pregnant 08/07/2016  . Chronic pain syndrome   . PTSD (post-traumatic stress disorder)   . Asthma   . Bilateral shoulder pain 01/25/2016  . CARPAL TUNNEL SYNDROME 02/01/2009    Past Surgical History:  Procedure Laterality Date  . CARPAL TUNNEL RELEASE    . CESAREAN SECTION N/A 01/23/2017   Procedure: CESAREAN SECTION;  Surgeon: Willodean Rosenthalarolyn Harraway-Smith, MD;  [facility];  Service: Obstetrics;  Laterality: N/A;  . WISDOM TOOTH EXTRACTION       OB History    Gravida  5   Para  3   Term  1   Preterm  2   AB  2   Living  1     SAB  1   TAB  1   Ectopic      Multiple  0   Live Births  1            Home Medications    Prior  to Admission medications   Medication Sig Start Date End Date Taking? Authorizing Provider  cyclobenzaprine (FLEXERIL) 10 MG tablet Take 1 tablet (10 mg total) by mouth 2 (two) times daily as needed for muscle spasms. 01/31/18   Eber HongMiller, Brian, MD  ibuprofen (ADVIL,MOTRIN) 800 MG tablet Take 1 tablet (800 mg total) by mouth 3 (three) times daily. 01/31/18   Eber HongMiller, Brian, MD  Norethindrone-Ethinyl Estradiol-Fe Biphas (LO LOESTRIN FE) 1 MG-10 MCG / 10 MCG tablet Take 1 tablet by mouth daily. 05/29/18   Lazaro ArmsEure, Luther H, MD  SSD 1 % cream APPLY TO AFFECTED AREA THREE TIMES A DAY 03/09/18   Lazaro ArmsEure, Luther H, MD    Family History Family History  Problem Relation Age of Onset  . Bipolar disorder Mother   . Schizophrenia Mother   . Diabetes Mother   . Hypertension Mother   . Stroke Father   . Heart disease Maternal Grandmother   . Heart disease Paternal Grandfather     Social History Social History   Tobacco Use  . Smoking status: Light Tobacco Smoker    Packs/day: 0.50    Years: 0.50  Pack years: 0.25    Types: Cigarettes    Last attempt to quit: 07/02/2016    Years since quitting: 2.1  . Smokeless tobacco: Never Used  Substance Use Topics  . Alcohol use: No    Comment: occ  . Drug use: Yes    Frequency: 1.0 times per week    Types: Marijuana    Comment: Prior pregnancy     Allergies   Metronidazole; Penicillins; and Amoxicillin   Review of Systems Review of Systems  All other systems reviewed and are negative.    Physical Exam Updated Vital Signs BP 115/60 (BP Location: Left Arm)   Pulse 87   Temp 98.3 F (36.8 C) (Oral)   Resp 17   Ht 5\' 2"  (1.575 m)   Wt 99.8 kg   LMP 07/20/2018   SpO2 100%   BMI 40.24 kg/m   Physical Exam  Nursing note and vitals reviewed.  27 year old female, resting comfortably and in no acute distress. Vital signs are normal. Oxygen saturation is 100%, which is normal. Head is normocephalic and atraumatic. PERRLA, EOMI. Oropharynx is  clear. Neck is nontender and supple without adenopathy or JVD. Back is nontender and there is no CVA tenderness. Lungs are clear without rales, wheezes, or rhonchi. Chest is nontender. Heart has regular rate and rhythm without murmur. Abdomen is soft, with moderate epigastric and right upper quadrant tenderness.  There is no rebound or guarding.  There is a plus/minus Murphy sign present.  There are no masses or hepatosplenomegaly and peristalsis is hypoactive. Extremities have no cyanosis or edema, full range of motion is present. Skin is warm and dry without rash. Neurologic: Mental status is normal, cranial nerves are intact, there are no motor or sensory deficits.  ED Treatments / Results  Labs (all labs ordered are listed, but only abnormal results are displayed) Labs Reviewed  COMPREHENSIVE METABOLIC PANEL - Abnormal; Notable for the following components:      Result Value   Potassium 3.4 (*)    Glucose, Bld 123 (*)    AST 14 (*)    Total Bilirubin 0.2 (*)    All other components within normal limits  CBC WITH DIFFERENTIAL/PLATELET - Abnormal; Notable for the following components:   WBC 11.5 (*)    Neutro Abs 8.3 (*)    All other components within normal limits  LIPASE, BLOOD  URINALYSIS, ROUTINE W REFLEX MICROSCOPIC  POC URINE PREG, ED   Radiology No results found.  Procedures Procedures  Medications Ordered in ED Medications  ondansetron (ZOFRAN) injection 4 mg (has no administration in time range)  morphine 4 MG/ML injection 4 mg (has no administration in time range)     Initial Impression / Assessment and Plan / ED Course  I have reviewed the triage vital signs and the nursing notes.  Pertinent labs & imaging results that were available during my care of the patient were reviewed by me and considered in my medical decision making (see chart for details).  Abdominal pain concerning for biliary colic.  Consider GERD, peptic ulcer disease, gastric ulcer.  Doubt  pancreatitis.  Old records are reviewed, and she has no relevant past visits.  We will give IV fluids, morphine, ondansetron and obtain screening labs as well as abdominal ultrasound.  Of note, CT of abdomen and pelvis in 2012 showed no evidence of cholelithiasis.  Screening labs are unremarkable.  Abdominal ultrasound is pending.  Case is signed out to Dr. Jodi Mourning.  Final Clinical Impressions(s) /  ED Diagnoses   Final diagnoses:  RUQ abdominal pain    ED Discharge Orders    None       Dione Booze, MD 08/17/18 [phone]

## 2018-08-17 NOTE — Transfer of Care (Signed)
Immediate Anesthesia Transfer of Care Note  Patient: Jade Taylor  Procedure(s) Performed: LAPAROSCOPIC CHOLECYSTECTOMY (N/A Abdomen)  Patient Location: PACU  Anesthesia Type:General  Level of Consciousness: awake, alert  and patient cooperative  Airway & Oxygen Therapy: Patient Spontanous Breathing and non-rebreather face mask  Post-op Assessment: Report given to RN and Post -op Vital signs reviewed and stable  Post vital signs: Reviewed and stable  Last Vitals:  Vitals Value Taken Time  BP    Temp    Pulse 77 08/17/2018 11:56 AM  Resp 11 08/17/2018 11:56 AM  SpO2 99 % 08/17/2018 11:56 AM  Vitals shown include unvalidated device data.  Last Pain:  Vitals:   08/17/18 0829  TempSrc:   PainSc: 0-No pain         Complications: No apparent anesthesia complications

## 2018-08-17 NOTE — ED Notes (Signed)
Pt came to desk while I was in another room. Other staff said pt was very upset.  Went in room to talk with pt who was crying and states the staff was rude.  Wants to know when she is going to ultrasound.  Advised I had called them and she was next.  Pt upset about this and states she is in an emergency room and her test should come first.  Ultrasound arrived to transport pt while she was talking with me.  Pt requesting something to eat and drink and was very upset when I declined until ultrasound completed.  States she will just drink out of the sink.

## 2018-08-17 NOTE — ED Provider Notes (Signed)
Patient US showed gallstones and cholecystitis. Fluids and pain medicines ordered. Discussed with general surgery for consult. LFT nl, no fever, minimal WBC elevation.  Jade Taylor    Orvan Papadakis, MD 08/17/18 [phone]

## 2018-08-17 NOTE — ED Notes (Signed)
Dr Lovell SheehanJenkins in with pt.

## 2018-08-17 NOTE — ED Triage Notes (Signed)
Pt c/o abd and back pain, N/V, loose stool x30 minutes ago.

## 2018-08-17 NOTE — Anesthesia Procedure Notes (Signed)
Procedure Name: Intubation Date/Time: 08/17/2018 11:01 AM Performed by: Vista Deck, CRNA Pre-anesthesia Checklist: Patient identified, Patient being monitored, Timeout performed, Emergency Drugs available and Suction available Patient Re-evaluated:Patient Re-evaluated prior to induction Oxygen Delivery Method: Circle System Utilized Preoxygenation: Pre-oxygenation with 100% oxygen Induction Type: IV induction, Rapid sequence and Cricoid Pressure applied Laryngoscope Size: Mac and 3 Grade View: Grade I Tube type: Oral Tube size: 7.0 mm Number of attempts: 1 Airway Equipment and Method: stylet and Oral airway Placement Confirmation: ETT inserted through vocal cords under direct vision,  positive ETCO2 and breath sounds checked- equal and bilateral Secured at: 21 cm Tube secured with: Tape Dental Injury: Teeth and Oropharynx as per pre-operative assessment

## 2018-08-17 NOTE — Anesthesia Postprocedure Evaluation (Signed)
Anesthesia Post Note  Patient: Cherrie GauzeGabrielle E Deines  Procedure(s) Performed: LAPAROSCOPIC CHOLECYSTECTOMY (N/A Abdomen)  Patient location during evaluation: PACU Anesthesia Type: General Level of consciousness: awake and alert and patient cooperative Pain management: satisfactory to patient Vital Signs Assessment: post-procedure vital signs reviewed and stable Respiratory status: spontaneous breathing Cardiovascular status: stable Postop Assessment: no apparent nausea or vomiting Anesthetic complications: no     Last Vitals:  Vitals:   08/17/18 1230 08/17/18 1245  BP: 112/63 126/79  Pulse: 64 65  Resp: 16 14  Temp:    SpO2: 96% 96%    Last Pain:  Vitals:   08/17/18 1250  TempSrc:   PainSc: Asleep                 Shem Plemmons

## 2018-08-17 NOTE — ED Notes (Addendum)
Pt taken to Day surgery. [facility]

## 2018-08-17 NOTE — Op Note (Signed)
Patient:  Jade Taylor  DOB:  04-Mar-1991  MRN:  [id number]   Preop Diagnosis: Acute cholecystitis, cholelithiasis  Postop Diagnosis: Same  Procedure: Laparoscopic cholecystectomy  Surgeon: Franky MachoMark Anthony Tamburo, MD  Anes: General endotracheal  Indications: Patient is a 27 year old white female who presents with acute cholecystitis secondary to cholelithiasis.  The risks and benefits of the procedure including bleeding, infection, hepatobiliary injury, and the possibility of an open procedure were fully explained to the patient, who gave informed consent.  Procedure note: The patient was placed in the supine position.  After induction of general endotracheal anesthesia, the abdomen was prepped and draped using the usual sterile technique with DuraPrep.  Surgical site confirmation was performed.  A supraumbilical incision was made down to the fascia.  A Veress needle was introduced into the abdominal cavity and confirmation of placement was done using the saline drop test.  The abdomen was then insufflated to 16 mmHg pressure.  An 11 mm trocar was introduced into the abdominal cavity under direct visualization without difficulty.  The patient was placed in reverse Trendelenburg position and an 11 mm trocar was placed in the epigastric region and 5 mm trochars were placed the right upper quadrant and right flank regions.  Liver was inspected and noted to be within normal limits.  The gallbladder wall was noted to be edematous with a large stone towards the neck of the gallbladder.  The gallbladder was retracted in a dynamic fashion in order to provide a critical view of the triangle of Calot.  The cystic duct was first identified.  Its juncture to the infundibulum was fully identified.  Endoclips were placed proximally and distally on the cystic duct, and the cystic duct was divided.  This was likewise done to the cystic artery.  The gallbladder was freed away from the gallbladder fossa using Bovie  electrocautery.  The gallbladder was delivered through the epigastric trocar site using an Endo Catch bag.  The gallbladder fossa was inspected and no abnormal bleeding or bile leakage was noted.  Surgicel was placed in the gallbladder fossa.  All fluid and air were then evacuated from the abdominal cavity prior to removal of the trochars.  All wounds were irrigated with normal saline.  All wounds were injected with Exparel.  The epigastric fascia was reapproximated using 0 Vicryl interrupted suture.  All skin incisions were closed using staples.  Betadine ointment and dry sterile dressings were applied.  All tape and needle counts were correct at the end of the procedure.  Patient was extubated in the operating room and transferred to PACU in stable condition.    Complications: None  EBL: Minimal  Specimen: Gallbladder

## 2018-08-17 NOTE — H&P (Signed)
Jade Taylor is an 27 y.o. female.   Chief Complaint: Right upper quadrant abdominal pain HPI: Patient is a 27 year old white female who presented to the emergency room with worsening right upper quadrant abdominal pain and nausea.  Patient states she has had this intermittently for some time now.  The pain became intense and she presented to the emergency room.  Ultrasound the gallbladder reveals cholelithiasis with a thickened gallbladder wall.  She continues to have right upper quadrant abdominal pain and nausea.  Patient denies any fever, chills, jaundice.  Past Medical History:  Diagnosis Date  . Asthma   . Chronic pain syndrome   . 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 Luna Glasgow  . PTSD (post-traumatic stress disorder)     Past Surgical History:  Procedure Laterality Date  . CARPAL TUNNEL RELEASE    . CESAREAN SECTION N/A 01/23/2017   Procedure: CESAREAN SECTION;  Surgeon: Lavonia Drafts, MD;  Location: [place];  Service: Obstetrics;  Laterality: N/A;  . WISDOM TOOTH EXTRACTION      Family History  Problem Relation Age of Onset  . Bipolar disorder Mother   . Schizophrenia Mother   . Diabetes Mother   . Hypertension Mother   . Stroke Father   . Heart disease Maternal Grandmother   . Heart disease Paternal Grandfather    Social History:  reports that she has been smoking cigarettes. She has a 0.25 pack-year smoking history. She has never used smokeless tobacco. She reports that she has current or past drug history. Drug: Marijuana. Frequency: 1.00 time per week. She reports that she does not drink alcohol.  Allergies:  Allergies  Allergen Reactions  . Metronidazole Hives, Shortness Of Breath, Rash and Other (See Comments)    Rash, Burning sensation- was hospitalized  . Penicillins Shortness Of Breath, Itching and Rash    Has patient had a PCN reaction causing immediate rash, facial/tongue/throat swelling, SOB or  lightheadedness with hypotension: No Has patient had a PCN reaction causing severe rash involving mucus membranes or skin necrosis: No Has patient had a PCN reaction that required hospitalization Yes Has patient had a PCN reaction occurring within the last 10 years: No If all of the above answers are "NO", then may proceed with Cephalosporin use.   Marland Kitchen Amoxicillin Hives    Has patient had a PCN reaction causing immediate rash, facial/tongue/throat swelling, SOB or lightheadedness with hypotension: Yes Has patient had a PCN reaction causing severe rash involving mucus membranes or skin necrosis: No Has patient had a PCN reaction that required hospitalization No Has patient had a PCN reaction occurring within the last 10 years: No If all of the above answers are "NO", then may proceed with Cephalosporin use.      (Not in a hospital admission)  Results for orders placed or performed during the hospital encounter of 08/17/18 (from the past 48 hour(s))  Urinalysis, Routine w reflex microscopic     Status: None   Collection Time: 08/17/18  5:14 AM  Result Value Ref Range   Color, Urine YELLOW YELLOW   APPearance CLEAR CLEAR   Specific Gravity, Urine 1.014 1.005 - 1.030   pH 6.0 5.0 - 8.0   Glucose, UA NEGATIVE NEGATIVE mg/dL   Hgb urine dipstick NEGATIVE NEGATIVE   Bilirubin Urine NEGATIVE NEGATIVE   Ketones, ur NEGATIVE NEGATIVE mg/dL   Protein, ur NEGATIVE NEGATIVE mg/dL   Nitrite NEGATIVE NEGATIVE   Leukocytes, UA NEGATIVE NEGATIVE  Comment: Performed at [facility], [address], [place], [place] [address]  Comprehensive metabolic panel     Status: Abnormal   Collection Time: 08/17/18  5:37 AM  Result Value Ref Range   Sodium 139 135 - 145 mmol/L   Potassium 3.4 (L) 3.5 - 5.1 mmol/L   Chloride 107 98 - 111 mmol/L   CO2 22 22 - 32 mmol/L   Glucose, Bld 123 (H) 70 - 99 mg/dL   BUN 10 6 - 20 mg/dL   Creatinine, Ser 0.72 0.44 - 1.00 mg/dL   Calcium 9.4 8.9 - 10.3 mg/dL    Total Protein 7.5 6.5 - 8.1 g/dL   Albumin 3.8 3.5 - 5.0 g/dL   AST 14 (L) 15 - 41 U/L   ALT 12 0 - 44 U/L   Alkaline Phosphatase 64 38 - 126 U/L   Total Bilirubin 0.2 (L) 0.3 - 1.2 mg/dL   GFR calc non Af Amer >60 >60 mL/min   GFR calc Af Amer >60 >60 mL/min    Comment: (NOTE) The eGFR has been calculated using the CKD EPI equation. This calculation has not been validated in all clinical situations. eGFR's persistently <60 mL/min signify possible Chronic Kidney Disease.    Anion gap 10 5 - 15    Comment: Performed at [facility], [address] [place], [place] [address]  Lipase, blood     Status: None   Collection Time: 08/17/18  5:37 AM  Result Value Ref Range   Lipase 40 11 - 51 U/L    Comment: Performed at [facility], [address] [place], [place] [address]  CBC with Differential     Status: Abnormal   Collection Time: 08/17/18  5:37 AM  Result Value Ref Range   WBC 11.5 (H) 4.0 - 10.5 K/uL   RBC 4.90 3.87 - 5.11 MIL/uL   Hemoglobin 14.7 12.0 - 15.0 g/dL   HCT 43.9 36.0 - 46.0 %   MCV 89.6 78.0 - 100.0 fL   MCH 30.0 26.0 - 34.0 pg   MCHC 33.5 30.0 - 36.0 g/dL   RDW 13.0 11.5 - 15.5 %   Platelets 195 150 - 400 K/uL   Neutrophils Relative % 72 %   Neutro Abs 8.3 (H) 1.7 - 7.7 K/uL   Lymphocytes Relative 21 %   Lymphs Abs 2.4 0.7 - 4.0 K/uL   Monocytes Relative 6 %   Monocytes Absolute 0.7 0.1 - 1.0 K/uL   Eosinophils Relative 1 %   Eosinophils Absolute 0.1 0.0 - 0.7 K/uL   Basophils Relative 0 %   Basophils Absolute 0.0 0.0 - 0.1 K/uL    Comment: Performed at [facility], [address] [place], [place] [address]  POC urine preg, ED     Status: None   Collection Time: 08/17/18  5:44 AM  Result Value Ref Range   Preg Test, Ur NEGATIVE NEGATIVE    Comment:        THE SENSITIVITY OF THIS METHODOLOGY IS >24 mIU/mL    US Abdomen Complete  Result Date: 08/17/2018 CLINICAL DATA:  27 year old female with right upper quadrant pain for 3 months. Initial  encounter. EXAM: ABDOMEN ULTRASOUND COMPLETE COMPARISON:  08/13/2011 CT. FINDINGS: Gallbladder: Gallstones measuring up to 1.7 cm. Gallbladder wall thickening measuring up to 6.5 mm. Patient was tender over the gallbladder during scanning per sonographer. Common bile duct: Diameter: 1.4 mm Liver: No focal lesion identified. Within normal limits in parenchymal echogenicity. Portal vein is patent on color  Doppler imaging with normal direction of blood flow towards the liver. IVC: No abnormality visualized. Pancreas: Suboptimally evaluated secondary to habitus and bowel gas. Portions visualized unremarkable. Spleen: Size and appearance within normal limits. Right Kidney: Length: 11.5 cm. Echogenicity within normal limits. No mass or hydronephrosis visualized. Left Kidney: Length: 11.5 cm. Echogenicity within normal limits. No hydronephrosis. 2 x 1.8 x 1.8 cm rounded structure adjacent to the left kidney probably represents accessory splenic tissue as noted on prior CT. Abdominal aorta: No aneurysm visualized. Other findings: None. IMPRESSION: 1. Gallstones measuring up to 1.7 cm. Gallbladder wall thickening measuring up to 6.5 mm. Patient was tender over the gallbladder during scanning per sonographer. Findings suspicious for acute cholecystitis. 2. 2 x 1.8 x 1.8 cm rounded structure adjacent to the left kidney probably represents accessory splenic tissue as noted on prior CT. These results were called by telephone at the time of interpretation on 08/17/2018 at 8:38 am to Dr. Milinda Antis, who verbally acknowledged these results. Electronically Signed   By: Genia Del M.D.   On: 08/17/2018 08:41    Review of Systems  Constitutional: Positive for malaise/fatigue.  HENT: Negative.   Eyes: Negative.   Respiratory: Negative.   Cardiovascular: Negative.   Gastrointestinal: Positive for abdominal pain and nausea.  Genitourinary: Negative.   Musculoskeletal: Negative.   Skin: Negative.   Neurological: Negative.    Endo/Heme/Allergies: Negative.   Psychiatric/Behavioral: The patient is nervous/anxious.     Blood pressure 104/69, pulse 74, temperature 98.3 F (36.8 C), temperature source Oral, resp. rate 18, height '5\' 2"'  (1.575 m), weight 99.8 kg, last menstrual period 07/20/2018, SpO2 100 %, not currently breastfeeding. Physical Exam  Vitals reviewed. Constitutional: She is oriented to person, place, and time. She appears well-developed and well-nourished.  HENT:  Head: Normocephalic and atraumatic.  Eyes: No scleral icterus.  Cardiovascular: Normal rate, regular rhythm and normal heart sounds. Exam reveals no gallop and no friction rub.  No murmur heard. Respiratory: Effort normal and breath sounds normal. No respiratory distress. She has no wheezes. She has no rales.  GI: Soft. Bowel sounds are normal. She exhibits no distension. There is tenderness. There is guarding. There is no rebound.  Patient tender in the right upper quadrant to palpation.  No rigidity is noted.  Neurological: She is alert and oriented to person, place, and time.  Skin: Skin is warm and dry.     Assessment/Plan Impression: Acute cholecystitis, cholelithiasis Plan: Patient will be taken to the operating room today for laparoscopic cholecystectomy.  The risks and benefits of the procedure including bleeding, infection, hepatobiliary injury, and the possibility of an open procedure were fully explained to the patient, who gave informed consent.  Aviva Signs, MD 08/17/2018, 9:21 AM

## 2018-08-17 NOTE — Anesthesia Preprocedure Evaluation (Signed)
Anesthesia Evaluation  Patient identified by MRN, date of birth, ID band Patient awake    Reviewed: Allergy & Precautions, H&P , NPO status , Patient's Chart, lab work & pertinent test results, reviewed documented beta blocker date and time   Airway Mallampati: II  TM Distance: >3 FB Neck ROM: full    Dental no notable dental hx. (+) Dental Advidsory Given   Pulmonary neg pulmonary ROS, asthma , Current Smoker,    Pulmonary exam normal breath sounds clear to auscultation       Cardiovascular Exercise Tolerance: Good negative cardio ROS   Rhythm:regular Rate:Normal     Neuro/Psych  Neuromuscular disease negative neurological ROS  negative psych ROS   GI/Hepatic negative GI ROS, Neg liver ROS,   Endo/Other  negative endocrine ROS  Renal/GU negative Renal ROS  negative genitourinary   Musculoskeletal   Abdominal   Peds  Hematology negative hematology ROS (+)   Anesthesia Other Findings HCG negative this am Chronic Pain obese  Tobacco abuse 1 ppd PTSD NPO at 1930 9/22  Reproductive/Obstetrics negative OB ROS                             Anesthesia Physical Anesthesia Plan  ASA: II  Anesthesia Plan: General   Post-op Pain Management:    Induction:   PONV Risk Score and Plan:   Airway Management Planned:   Additional Equipment:   Intra-op Plan:   Post-operative Plan:   Informed Consent: I have reviewed the patients History and Physical, chart, labs and discussed the procedure including the risks, benefits and alternatives for the proposed anesthesia with the patient or authorized representative who has indicated his/her understanding and acceptance.   Dental Advisory Given  Plan Discussed with: CRNA and Anesthesiologist  Anesthesia Plan Comments:         Anesthesia Quick Evaluation

## 2018-08-19 ENCOUNTER — Encounter (HOSPITAL_COMMUNITY): Payer: Self-pay | Admitting: General Surgery

## 2018-08-20 ENCOUNTER — Ambulatory Visit: Payer: Medicaid Other | Admitting: Obstetrics & Gynecology

## 2018-08-25 ENCOUNTER — Encounter: Payer: Self-pay | Admitting: General Surgery

## 2018-08-25 ENCOUNTER — Ambulatory Visit (INDEPENDENT_AMBULATORY_CARE_PROVIDER_SITE_OTHER): Payer: Self-pay | Admitting: General Surgery

## 2018-08-25 VITALS — BP 120/71 | HR 79 | Temp 97.8°F | Resp 18 | Wt 222.0 lb

## 2018-08-25 DIAGNOSIS — Z09 Encounter for follow-up examination after completed treatment for conditions other than malignant neoplasm: Secondary | ICD-10-CM

## 2018-08-25 NOTE — Progress Notes (Signed)
Subjective:     Jade Taylor  Status post laparoscopic cholecystectomy.  Doing well.  Her preoperative symptoms have resolved.  She has no complaints. Objective:    BP 120/71 (BP Location: Left Arm, Patient Position: Sitting, Cuff Size: Large)   Pulse 79   Temp 97.8 F (36.6 C) (Temporal)   Resp 18   Wt 222 lb (100.7 kg)   BMI 40.60 kg/m   General:  alert, cooperative and no distress  Abdomen soft, incisions healing well.  Staples removed, Steri-Strips applied. Final pathology consistent with diagnosis.     Assessment:    Doing well postoperatively.    Plan:   May resume normal activity.  Follow-up here as needed.

## 2018-09-25 ENCOUNTER — Other Ambulatory Visit: Payer: Self-pay | Admitting: Obstetrics & Gynecology

## 2018-09-29 ENCOUNTER — Encounter: Payer: Self-pay | Admitting: Obstetrics & Gynecology

## 2018-09-29 ENCOUNTER — Ambulatory Visit (INDEPENDENT_AMBULATORY_CARE_PROVIDER_SITE_OTHER): Payer: Medicaid Other | Admitting: Obstetrics & Gynecology

## 2018-09-29 VITALS — BP 125/76 | HR 81 | Ht 62.0 in | Wt 220.0 lb

## 2018-09-29 DIAGNOSIS — Z01419 Encounter for gynecological examination (general) (routine) without abnormal findings: Secondary | ICD-10-CM

## 2018-09-29 DIAGNOSIS — Z309 Encounter for contraceptive management, unspecified: Secondary | ICD-10-CM

## 2018-09-29 MED ORDER — NORETHIN-ETH ESTRAD-FE BIPHAS 1 MG-10 MCG / 10 MCG PO TABS: 1.0000 | ORAL_TABLET | Freq: Every day | ORAL | 3 refills | Status: DC

## 2018-09-29 NOTE — Addendum Note (Signed)
Addended by: Lazaro Arms on: 09/29/2018 02:59 PM   Modules accepted: Orders

## 2018-09-29 NOTE — Progress Notes (Addendum)
Subjective:     Jade Taylor is a 27 y.o. female here for a routine exam.  Patient's last menstrual period was 09/23/2018. [id number] Birth Control Method:  OCP Menstrual Calendar(currently): regular  Current complaints: none.   Current acute medical issues:  None, HS   Recent Gynecologic History Patient's last menstrual period was 09/23/2018. Last Pap: 2018,  normal Last mammogram: ,    Past Medical History:  Diagnosis Date  . Asthma   . Chronic pain syndrome   . 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 Hilda Lias  . PTSD (post-traumatic stress disorder)     Past Surgical History:  Procedure Laterality Date  . CARPAL TUNNEL RELEASE    . CESAREAN SECTION N/A 01/23/2017   Procedure: CESAREAN SECTION;  Surgeon: Willodean Rosenthal, MD;  Location: [facility];  Service: Obstetrics;  Laterality: N/A;  . CHOLECYSTECTOMY N/A 08/17/2018   Procedure: LAPAROSCOPIC CHOLECYSTECTOMY;  Surgeon: Franky Macho, MD;  Location: [facility];  Service: General;  Laterality: N/A;  . WISDOM TOOTH EXTRACTION      OB History    Gravida  5   Para  3   Term  1   Preterm  2   AB  2   Living  1     SAB  1   TAB  1   Ectopic      Multiple  0   Live Births  1           Social History   Socioeconomic History  . Marital status: Single    Spouse name: Not on file  . Number of children: Not on file  . Years of education: Not on file  . Highest education level: Not on file  Occupational History  . Not on file  Social Needs  . Financial resource strain: Not on file  . Food insecurity:    Worry: Not on file    Inability: Not on file  . Transportation needs:    Medical: Not on file    Non-medical: Not on file  Tobacco Use  . Smoking status: Light Tobacco Smoker    Packs/day: 0.50    Years: 0.50    Pack years: 0.25    Types: Cigarettes    Last attempt to quit: 07/02/2016    Years since quitting: 2.2  . Smokeless tobacco: Never Used   Substance and Sexual Activity  . Alcohol use: No    Comment: occ  . Drug use: Yes    Frequency: 1.0 times per week    Types: Marijuana    Comment: Prior pregnancy  . Sexual activity: Yes    Birth control/protection: Pill  Lifestyle  . Physical activity:    Days per week: Not on file    Minutes per session: Not on file  . Stress: Not on file  Relationships  . Social connections:    Talks on phone: Not on file    Gets together: Not on file    Attends religious service: Not on file    Active member of club or organization: Not on file    Attends meetings of clubs or organizations: Not on file    Relationship status: Not on file  Other Topics Concern  . Not on file  Social History Narrative  . Not on file    Family History  Problem Relation Age of Onset  . Bipolar disorder Mother   . Schizophrenia Mother   . Diabetes Mother   .  Hypertension Mother   . Stroke Father   . Heart disease Maternal Grandmother   . Heart disease Paternal Grandfather      Current Outpatient Medications:  .  Norethindrone-Ethinyl Estradiol-Fe Biphas (LO LOESTRIN FE) 1 MG-10 MCG / 10 MCG tablet, Take 1 tablet by mouth daily., Disp: 84 tablet, Rfl: 3 .  SSD 1 % cream, APPLY TO AFFECTED AREA THREE TIMES A DAY, Disp: 50 g, Rfl: 11 .  oxyCODONE (ROXICODONE) 5 MG immediate release tablet, Take 1 tablet (5 mg total) by mouth every 4 (four) hours as needed for severe pain. (Patient not taking: Reported on 09/29/2018), Disp: 40 tablet, Rfl: 0  Review of Systems  Review of Systems  Constitutional: Negative for fever, chills, weight loss, malaise/fatigue and diaphoresis.  HENT: Negative for hearing loss, ear pain, nosebleeds, congestion, sore throat, neck pain, tinnitus and ear discharge.   Eyes: Negative for blurred vision, double vision, photophobia, pain, discharge and redness.  Respiratory: Negative for cough, hemoptysis, sputum production, shortness of breath, wheezing and stridor.   Cardiovascular:  Negative for chest pain, palpitations, orthopnea, claudication, leg swelling and PND.  Gastrointestinal: negative for abdominal pain. Negative for heartburn, nausea, vomiting, diarrhea, constipation, blood in stool and melena.  Genitourinary: Negative for dysuria, urgency, frequency, hematuria and flank pain.  Musculoskeletal: Negative for myalgias, back pain, joint pain and falls.  Skin: Negative for itching and rash.  Neurological: Negative for dizziness, tingling, tremors, sensory change, speech change, focal weakness, seizures, loss of consciousness, weakness and headaches.  Endo/Heme/Allergies: Negative for environmental allergies and polydipsia. Does not bruise/bleed easily.  Psychiatric/Behavioral: Negative for depression, suicidal ideas, hallucinations, memory loss and substance abuse. The patient is not nervous/anxious and does not have insomnia.        Objective:  Blood pressure 125/76, pulse 81, height 5\' 2"  (1.575 m), weight 220 lb (99.8 kg), last menstrual period 09/23/2018, not currently breastfeeding.   Physical Exam  Vitals reviewed. Constitutional: She is oriented to person, place, and time. She appears well-developed and well-nourished.  HENT:  Head: Normocephalic and atraumatic.        Right Ear: External ear normal.  Left Ear: External ear normal.  Nose: Nose normal.  Mouth/Throat: Oropharynx is clear and moist.  Eyes: Conjunctivae and EOM are normal. Pupils are equal, round, and reactive to light. Right eye exhibits no discharge. Left eye exhibits no discharge. No scleral icterus.  Neck: Normal range of motion. Neck supple. No tracheal deviation present. No thyromegaly present.  Cardiovascular: Normal rate, regular rhythm, normal heart sounds and intact distal pulses.  Exam reveals no gallop and no friction rub.   No murmur heard. Respiratory: Effort normal and breath sounds normal. No respiratory distress. She has no wheezes. She has no rales. She exhibits no  tenderness.  GI: Soft. Bowel sounds are normal. She exhibits no distension and no mass. There is no tenderness. There is no rebound and no guarding.  Genitourinary:  Breasts no masses skin changes or nipple changes bilaterally      Vulva is normal without lesions Vagina is pink moist without discharge Cervix normal in appearance and pap is done Uterus is normal size shape and contour Adnexa is negative with normal sized ovaries   Musculoskeletal: Normal range of motion. She exhibits no edema and no tenderness.  Neurological: She is alert and oriented to person, place, and time. She has normal reflexes. She displays normal reflexes. No cranial nerve deficit. She exhibits normal muscle tone. Coordination normal.  Skin: Skin is warm  and dry. No rash noted. No erythema. No pallor.  Psychiatric: She has a normal mood and affect. Her behavior is normal. Judgment and thought content normal.       Medications Ordered at today's visit: Meds ordered this encounter  Medications  . Norethindrone-Ethinyl Estradiol-Fe Biphas (LO LOESTRIN FE) 1 MG-10 MCG / 10 MCG tablet    Sig: Take 1 tablet by mouth daily.    Dispense:  84 tablet    Refill:  3    Other orders placed at today's visit: Orders Placed This Encounter  Procedures  . HIV Antibody (routine testing w rflx)  . RPR      Assessment:    Healthy female exam.    Plan:    Contraception: OCP (estrogen/progesterone). Follow up in: 1 year.     Return in about 1 year (around 09/30/2019) for yearly, with Dr Despina Hidden.

## 2018-09-30 LAB — HIV ANTIBODY (ROUTINE TESTING W REFLEX): HIV SCREEN 4TH GENERATION: NONREACTIVE

## 2018-09-30 LAB — RPR: RPR: NONREACTIVE

## 2018-10-01 LAB — CYTOLOGY - PAP
CHLAMYDIA, DNA PROBE: NEGATIVE
DIAGNOSIS: NEGATIVE
Neisseria Gonorrhea: NEGATIVE

## 2019-01-24 ENCOUNTER — Emergency Department (HOSPITAL_COMMUNITY)
Admission: EM | Admit: 2019-01-24 | Discharge: 2019-01-25 | Disposition: A | Discharge status: Home / Self Care | Payer: No Typology Code available for payment source | Attending: Emergency Medicine | Admitting: Emergency Medicine

## 2019-01-24 ENCOUNTER — Emergency Department (HOSPITAL_COMMUNITY): Payer: No Typology Code available for payment source

## 2019-01-24 ENCOUNTER — Encounter (HOSPITAL_COMMUNITY): Payer: Self-pay | Admitting: *Deleted

## 2019-01-24 ENCOUNTER — Other Ambulatory Visit: Payer: Self-pay

## 2019-01-24 DIAGNOSIS — F17210 Nicotine dependence, cigarettes, uncomplicated: Secondary | ICD-10-CM | POA: Diagnosis not present

## 2019-01-24 DIAGNOSIS — Y9389 Activity, other specified: Secondary | ICD-10-CM | POA: Insufficient documentation

## 2019-01-24 DIAGNOSIS — S4992XA Unspecified injury of left shoulder and upper arm, initial encounter: Secondary | ICD-10-CM | POA: Diagnosis present

## 2019-01-24 DIAGNOSIS — Y998 Other external cause status: Secondary | ICD-10-CM | POA: Diagnosis not present

## 2019-01-24 DIAGNOSIS — F1290 Cannabis use, unspecified, uncomplicated: Secondary | ICD-10-CM | POA: Insufficient documentation

## 2019-01-24 DIAGNOSIS — Z79899 Other long term (current) drug therapy: Secondary | ICD-10-CM | POA: Diagnosis not present

## 2019-01-24 DIAGNOSIS — S40012A Contusion of left shoulder, initial encounter: Secondary | ICD-10-CM | POA: Diagnosis not present

## 2019-01-24 DIAGNOSIS — S20219A Contusion of unspecified front wall of thorax, initial encounter: Secondary | ICD-10-CM

## 2019-01-24 DIAGNOSIS — Z9049 Acquired absence of other specified parts of digestive tract: Secondary | ICD-10-CM | POA: Insufficient documentation

## 2019-01-24 DIAGNOSIS — Y92410 Unspecified street and highway as the place of occurrence of the external cause: Secondary | ICD-10-CM | POA: Diagnosis not present

## 2019-01-24 DIAGNOSIS — J45909 Unspecified asthma, uncomplicated: Secondary | ICD-10-CM | POA: Insufficient documentation

## 2019-01-24 LAB — POC URINE PREG, ED: Preg Test, Ur: NEGATIVE

## 2019-01-24 MED ORDER — ACETAMINOPHEN 325 MG PO TABS
650.0000 mg | ORAL_TABLET | Freq: Once | ORAL | Status: AC
  Administered 2019-01-24: 650 mg via ORAL
  Filled 2019-01-24: qty 2
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
## 2019-01-25 NOTE — Discharge Instructions (Addendum)
Apply ice for 30 minutes at a time, 3-4 times a day.  Take acetaminophen and/or ibuprofen as needed for pain.

## 2019-05-31 ENCOUNTER — Other Ambulatory Visit: Payer: Self-pay | Admitting: Obstetrics & Gynecology

## 2019-05-31 ENCOUNTER — Telehealth: Payer: Self-pay | Admitting: Obstetrics & Gynecology

## 2019-05-31 NOTE — Telephone Encounter (Signed)
Pt aware meds was sent to pharmacy. [place]

## 2019-05-31 NOTE — Telephone Encounter (Signed)
Patient called stating that she called her pharmacy for a refill of her [facility] and they sent over a fax. Pt would like this sent back today because she down a couple of days and needs it. Please contact pt

## 2020-05-03 ENCOUNTER — Other Ambulatory Visit: Payer: Self-pay | Admitting: Obstetrics & Gynecology

## 2020-07-03 ENCOUNTER — Ambulatory Visit (INDEPENDENT_AMBULATORY_CARE_PROVIDER_SITE_OTHER): Payer: Medicaid Other | Admitting: Obstetrics & Gynecology

## 2020-07-03 ENCOUNTER — Other Ambulatory Visit: Payer: Self-pay

## 2020-07-03 ENCOUNTER — Other Ambulatory Visit (HOSPITAL_COMMUNITY)
Admission: RE | Admit: 2020-07-03 | Discharge: 2020-07-03 | Disposition: A | Discharge status: Home / Self Care | Payer: Medicaid Other | Source: Ambulatory Visit | Attending: Obstetrics & Gynecology | Admitting: Obstetrics & Gynecology

## 2020-07-03 ENCOUNTER — Encounter: Payer: Self-pay | Admitting: Obstetrics & Gynecology

## 2020-07-03 VITALS — Ht 62.0 in | Wt 234.0 lb

## 2020-07-03 DIAGNOSIS — Z01419 Encounter for gynecological examination (general) (routine) without abnormal findings: Secondary | ICD-10-CM | POA: Diagnosis not present

## 2020-07-03 DIAGNOSIS — Z124 Encounter for screening for malignant neoplasm of cervix: Secondary | ICD-10-CM | POA: Insufficient documentation

## 2020-07-03 DIAGNOSIS — F17200 Nicotine dependence, unspecified, uncomplicated: Secondary | ICD-10-CM | POA: Diagnosis not present

## 2020-07-03 DIAGNOSIS — Z113 Encounter for screening for infections with a predominantly sexual mode of transmission: Secondary | ICD-10-CM

## 2020-07-03 DIAGNOSIS — Z3041 Encounter for surveillance of contraceptive pills: Secondary | ICD-10-CM

## 2020-07-03 DIAGNOSIS — F1290 Cannabis use, unspecified, uncomplicated: Secondary | ICD-10-CM

## 2020-07-03 MED ORDER — LO LOESTRIN FE 1 MG-10 MCG / 10 MCG PO TABS: 1.0000 | ORAL_TABLET | Freq: Every day | ORAL | 3 refills | Status: DC

## 2020-07-03 MED ORDER — DOXYCYCLINE HYCLATE 100 MG PO TABS: 100.0000 mg | ORAL_TABLET | Freq: Two times a day (BID) | ORAL | 0 refills | Status: DC

## 2020-07-03 NOTE — Progress Notes (Signed)
Subjective:     Jade Taylor is a 29 y.o. female here for a routine exam.  No LMP recorded. [id number] Birth Control Method:  OCP Menstrual Calendar(currently): regular  Current complaints: HS lesions x 2.   Current acute medical issues:  none   Recent Gynecologic History No LMP recorded. Last Pap: 09/2018,  normal Last mammogram: n/a,  normal  Past Medical History:  Diagnosis Date  . Asthma   . Chronic pain syndrome   . 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 Hilda Lias  . PTSD (post-traumatic stress disorder)     Past Surgical History:  Procedure Laterality Date  . CARPAL TUNNEL RELEASE    . CESAREAN SECTION N/A 01/23/2017   Procedure: CESAREAN SECTION;  Surgeon: Willodean Rosenthal, MD;  Location: [facility]IRTH[facility];  Service: Obstetrics;  Laterality: N/A;  . CHOLECYSTECTOMY N/A 08/17/2018   Procedure: LAPAROSCOPIC CHOLECYSTECTOMY;  Surgeon: Franky Macho, MD;  Location: [facility];  Service: General;  Laterality: N/A;  . WISDOM TOOTH EXTRACTION      OB History    Gravida  5   Para  3   Term  1   Preterm  2   AB  2   Living  1     SAB  1   TAB  1   Ectopic      Multiple  0   Live Births  1           Social History   Socioeconomic History  . Marital status: Single    Spouse name: Not on file  . Number of children: Not on file  . Years of education: Not on file  . Highest education level: Not on file  Occupational History  . Not on file  Tobacco Use  . Smoking status: Current Every Day Smoker    Packs/day: 0.50    Years: 0.50    Pack years: 0.25    Types: Cigarettes  . Smokeless tobacco: Never Used  Vaping Use  . Vaping Use: Never used  Substance and Sexual Activity  . Alcohol use: Yes    Comment: occ  . Drug use: Yes    Frequency: 1.0 times per week    Types: Marijuana  . Sexual activity: Yes    Birth control/protection: Pill  Other Topics Concern  . Not on file  Social History Narrative  . Not on file    Social Determinants of Health   Financial Resource Strain: Unknown  . Difficulty of Paying Living Expenses: Patient refused  Food Insecurity: No Food Insecurity  . Worried About Programme researcher, broadcasting/film/video in the Last Year: Never true  . Ran Out of Food in the Last Year: Never true  Transportation Needs: No Transportation Needs  . Lack of Transportation (Medical): No  . Lack of Transportation (Non-Medical): No  Physical Activity: Insufficiently Active  . Days of Exercise per Week: 3 days  . Minutes of Exercise per Session: 30 min  Stress: Stress Concern Present  . Feeling of Stress : To some extent  Social Connections: Moderately Isolated  . Frequency of Communication with Friends and Family: More than three times a week  . Frequency of Social Gatherings with Friends and Family: Twice a week  . Attends Religious Services: 1 to 4 times per year  . Active Member of Clubs or Organizations: No  . Attends Banker Meetings: Never  . Marital Status: Never married    Family History  Problem Relation  Age of Onset  . Bipolar disorder Mother   . Schizophrenia Mother   . Diabetes Mother   . Hypertension Mother   . Stroke Father   . Heart disease Maternal Grandmother   . Heart disease Paternal Grandfather      Current Outpatient Medications:  .  LO LOESTRIN FE 1 MG-10 MCG / 10 MCG tablet, TAKE 1 TABLET BY MOUTH DAILY, Disp: 84 tablet, Rfl: 3 .  SSD 1 % cream, APPLY EXTERNALLY TO THE AFFECTED AREA THREE TIMES DAILY, Disp: 50 g, Rfl: 11  Review of Systems  Review of Systems  Constitutional: Negative for fever, chills, weight loss, malaise/fatigue and diaphoresis.  HENT: Negative for hearing loss, ear pain, nosebleeds, congestion, sore throat, neck pain, tinnitus and ear discharge.   Eyes: Negative for blurred vision, double vision, photophobia, pain, discharge and redness.  Respiratory: Negative for cough, hemoptysis, sputum production, shortness of breath, wheezing and  stridor.   Cardiovascular: Negative for chest pain, palpitations, orthopnea, claudication, leg swelling and PND.  Gastrointestinal: negative for abdominal pain. Negative for heartburn, nausea, vomiting, diarrhea, constipation, blood in stool and melena.  Genitourinary: Negative for dysuria, urgency, frequency, hematuria and flank pain.  Musculoskeletal: Negative for myalgias, back pain, joint pain and falls.  Skin: Negative for itching and rash.  Neurological: Negative for dizziness, tingling, tremors, sensory change, speech change, focal weakness, seizures, loss of consciousness, weakness and headaches.  Endo/Heme/Allergies: Negative for environmental allergies and polydipsia. Does not bruise/bleed easily.  Psychiatric/Behavioral: Negative for depression, suicidal ideas, hallucinations, memory loss and substance abuse. The patient is not nervous/anxious and does not have insomnia.        Objective:  Height 5\' 2"  (1.575 m), weight 234 lb (106.1 kg).   Physical Exam  Vitals reviewed. Constitutional: She is oriented to person, place, and time. She appears well-developed and well-nourished.  HENT:  Head: Normocephalic and atraumatic.        Right Ear: External ear normal.  Left Ear: External ear normal.  Nose: Nose normal.  Mouth/Throat: Oropharynx is clear and moist.  Eyes: Conjunctivae and EOM are normal. Pupils are equal, round, and reactive to light. Right eye exhibits no discharge. Left eye exhibits no discharge. No scleral icterus.  Neck: Normal range of motion. Neck supple. No tracheal deviation present. No thyromegaly present.  Cardiovascular: Normal rate, regular rhythm, normal heart sounds and intact distal pulses.  Exam reveals no gallop and no friction rub.   No murmur heard. Respiratory: Effort normal and breath sounds normal. No respiratory distress. She has no wheezes. She has no rales. She exhibits no tenderness.  GI: Soft. Bowel sounds are normal. She exhibits no distension  and no mass. There is no tenderness. There is no rebound and no guarding.  Genitourinary:  Breasts no masses skin changes or nipple changes bilaterally      Vulva is normal without lesions Vagina is pink moist without discharge Cervix normal in appearance and pap is done Uterus is normal size shape and contour Adnexa is negative with normal sized ovaries   Musculoskeletal: Normal range of motion. She exhibits no edema and no tenderness.  Neurological: She is alert and oriented to person, place, and time. She has normal reflexes. She displays normal reflexes. No cranial nerve deficit. She exhibits normal muscle tone. Coordination normal.  Skin: Skin is warm and dry. No rash noted. No erythema. No pallor.  Psychiatric: She has a normal mood and affect. Her behavior is normal. Judgment and thought content normal.  Medications Ordered at today's visit: No orders of the defined types were placed in this encounter.   Other orders placed at today's visit: Orders Placed This Encounter  Procedures  . HIV antibody (with reflex)  . RPR      Assessment:    Normal Gyn exam.    Plan:    Contraception: lo lo estrin. Follow up in: 3 weeks. doxycycline e prescribed     No follow-ups on file.

## 2020-07-04 LAB — RPR: RPR Ser Ql: NONREACTIVE

## 2020-07-04 LAB — HIV ANTIBODY (ROUTINE TESTING W REFLEX): HIV Screen 4th Generation wRfx: NONREACTIVE

## 2020-07-05 LAB — CYTOLOGY - PAP
Chlamydia: NEGATIVE
Comment: NEGATIVE
Comment: NEGATIVE
Comment: NORMAL
Diagnosis: NEGATIVE
High risk HPV: NEGATIVE
Neisseria Gonorrhea: NEGATIVE

## 2020-12-19 IMAGING — DX DG CLAVICLE*L*
2 series · 2 of 2 positions shown · non-contrast
Comparison: None.

CLINICAL DATA: Pain after trauma

EXAM:
LEFT CLAVICLE - 2+ VIEWS

[clavicle ap]
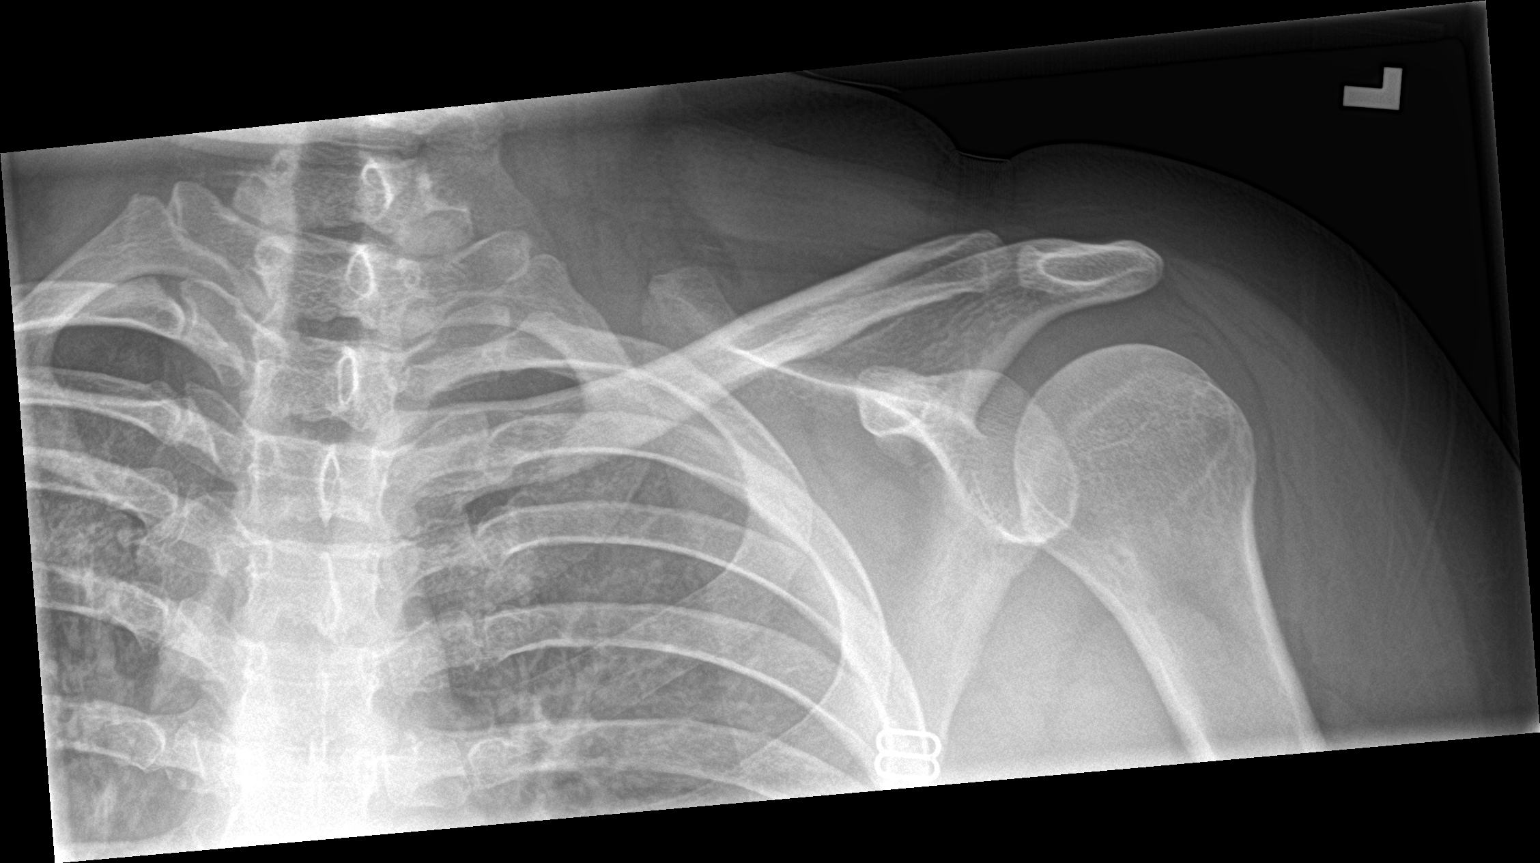

[clavicle axial]
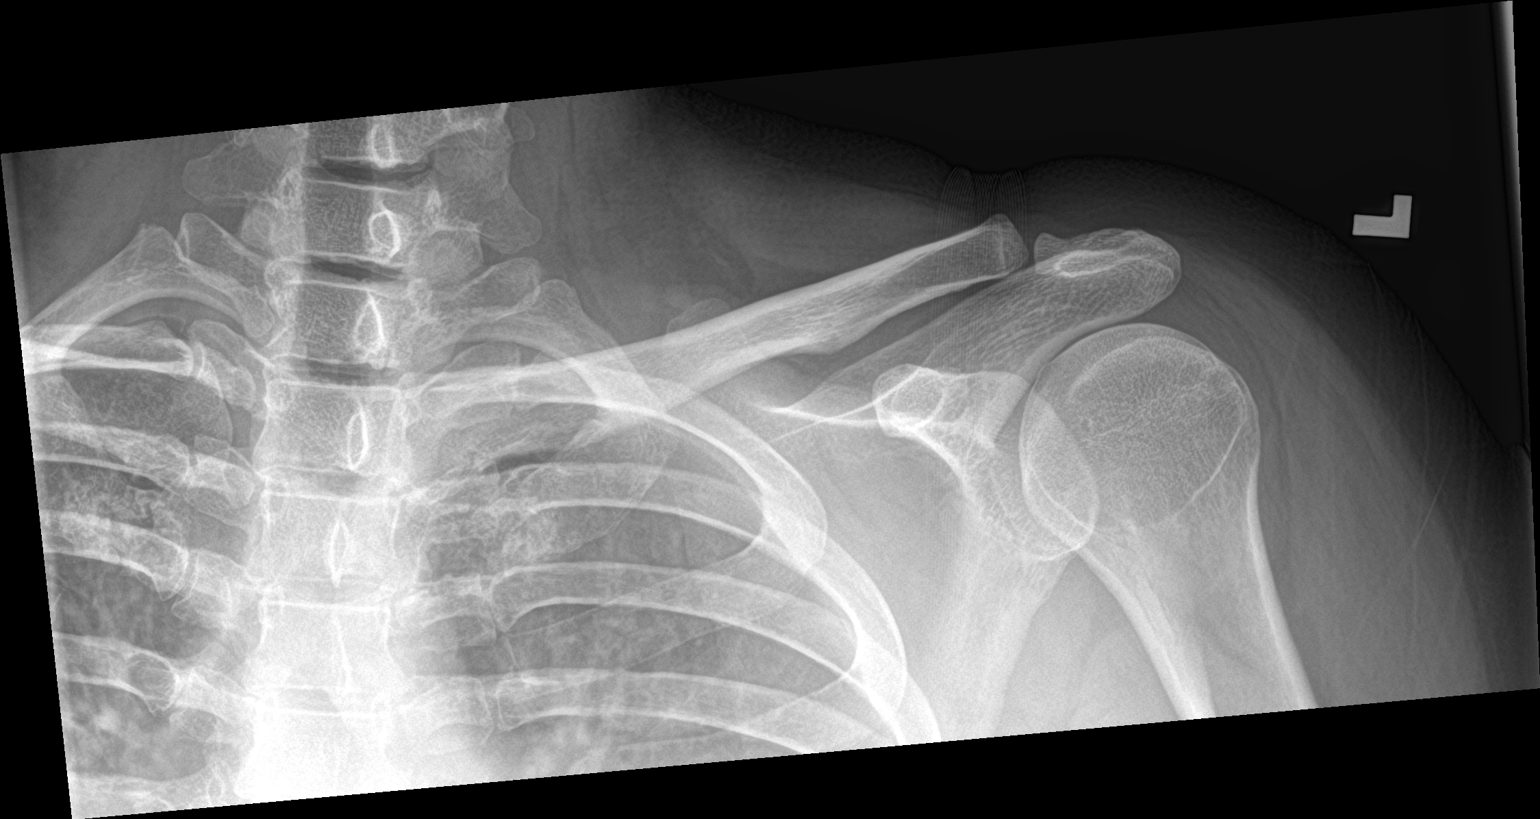

[2 of 2 positions shown; findings below may reference images not displayed]

FINDINGS: There is no evidence of fracture or other focal bone lesions. Soft
tissues are unremarkable.
IMPRESSION: Negative.

## 2021-11-26 ENCOUNTER — Other Ambulatory Visit: Payer: Self-pay

## 2021-11-26 ENCOUNTER — Emergency Department (HOSPITAL_COMMUNITY): Payer: Medicaid Other

## 2021-11-26 ENCOUNTER — Emergency Department (HOSPITAL_COMMUNITY)
Admission: EM | Admit: 2021-11-26 | Discharge: 2021-11-26 | Disposition: A | Discharge status: Home / Self Care | Payer: Medicaid Other | Attending: Emergency Medicine | Admitting: Emergency Medicine

## 2021-11-26 ENCOUNTER — Encounter (HOSPITAL_COMMUNITY): Payer: Self-pay

## 2021-11-26 DIAGNOSIS — D72829 Elevated white blood cell count, unspecified: Secondary | ICD-10-CM | POA: Insufficient documentation

## 2021-11-26 DIAGNOSIS — R110 Nausea: Secondary | ICD-10-CM | POA: Diagnosis not present

## 2021-11-26 DIAGNOSIS — R1011 Right upper quadrant pain: Secondary | ICD-10-CM | POA: Insufficient documentation

## 2021-11-26 LAB — COMPREHENSIVE METABOLIC PANEL
ALT: 21 U/L (ref 0–44)
AST: 19 U/L (ref 15–41)
Albumin: 3.9 g/dL (ref 3.5–5.0)
Alkaline Phosphatase: 90 U/L (ref 38–126)
Anion gap: 9 (ref 5–15)
BUN: 10 mg/dL (ref 6–20)
CO2: 21 mmol/L — ABNORMAL LOW (ref 22–32)
Calcium: 8.6 mg/dL — ABNORMAL LOW (ref 8.9–10.3)
Chloride: 104 mmol/L (ref 98–111)
Creatinine, Ser: 0.61 mg/dL (ref 0.44–1.00)
GFR, Estimated: >60 mL/min (ref >60)
Glucose, Bld: 114 mg/dL — ABNORMAL HIGH (ref 70–99)
Potassium: 4 mmol/L (ref 3.5–5.1)
Sodium: 134 mmol/L — ABNORMAL LOW (ref 135–145)
Total Bilirubin: 0.4 mg/dL (ref 0.3–1.2)
Total Protein: 7.6 g/dL (ref 6.5–8.1)

## 2021-11-26 LAB — CBC
HCT: 42.6 % (ref 36.0–46.0)
Hemoglobin: 14.6 g/dL (ref 12.0–15.0)
MCH: 30.2 pg (ref 26.0–34.0)
MCHC: 34.3 g/dL (ref 30.0–36.0)
MCV: 88 fL (ref 80.0–100.0)
Platelets: 229 10*3/uL (ref 150–400)
RBC: 4.84 MIL/uL (ref 3.87–5.11)
RDW: 13 % (ref 11.5–15.5)
WBC: 12.3 10*3/uL — ABNORMAL HIGH (ref 4.0–10.5)
nRBC: 0 % (ref 0.0–0.2)

## 2021-11-26 LAB — URINALYSIS, ROUTINE W REFLEX MICROSCOPIC
Bilirubin Urine: NEGATIVE
Glucose, UA: NEGATIVE mg/dL
Hgb urine dipstick: NEGATIVE
Ketones, ur: NEGATIVE mg/dL
Leukocytes,Ua: NEGATIVE
Nitrite: NEGATIVE
Protein, ur: NEGATIVE mg/dL
Specific Gravity, Urine: 1.01 (ref 1.005–1.030)
pH: 6 (ref 5.0–8.0)

## 2021-11-26 LAB — LIPASE, BLOOD: Lipase: 37 U/L (ref 11–51)

## 2021-11-26 LAB — POC URINE PREG, ED: Preg Test, Ur: NEGATIVE

## 2021-11-26 MED ORDER — KETOROLAC TROMETHAMINE 30 MG/ML IJ SOLN
30.0000 mg | Freq: Once | INTRAMUSCULAR | Status: AC
Start: 2021-11-26 — End: 2021-11-26
  Administered 2021-11-26: 30 mg via INTRAVENOUS
  Filled 2021-11-26: qty 1

## 2021-11-26 MED ORDER — ONDANSETRON HCL 4 MG/2ML IJ SOLN
4.0000 mg | Freq: Once | INTRAMUSCULAR | Status: AC
  Administered 2021-11-26: 4 mg via INTRAVENOUS
  Filled 2021-11-26: qty 2

## 2021-11-26 MED ORDER — IBUPROFEN 800 MG PO TABS: 800.0000 mg | ORAL_TABLET | Freq: Three times a day (TID) | ORAL | 0 refills | Status: DC

## 2021-11-26 MED ORDER — HYDROMORPHONE HCL 1 MG/ML IJ SOLN
1.0000 mg | Freq: Once | INTRAMUSCULAR | Status: AC
  Administered 2021-11-26: 1 mg via INTRAVENOUS
  Filled 2021-11-26: qty 1

## 2021-11-26 MED ORDER — ONDANSETRON HCL 4 MG PO TABS
4.0000 mg | ORAL_TABLET | Freq: Four times a day (QID) | ORAL | 0 refills | Status: DC
Start: 2021-11-26 — End: 2022-02-20

## 2021-11-26 MED ORDER — METHOCARBAMOL 500 MG PO TABS: 500.0000 mg | ORAL_TABLET | Freq: Three times a day (TID) | ORAL | 0 refills | Status: DC

## 2021-11-26 MED ORDER — IOHEXOL 350 MG/ML SOLN
80.0000 mL | Freq: Once | INTRAVENOUS | Status: AC | PRN
  Administered 2021-11-26: 80 mL via INTRAVENOUS

## 2021-11-26 NOTE — ED Triage Notes (Signed)
Patient complaining or RLQ pain for the past week. Reports nausea. Pain worse with coughing.

## 2021-11-26 NOTE — Discharge Instructions (Addendum)
Rest, avoid twisting bending or heavy lifting for 1 week.  Take the medication as directed until finished.  Return to the emergency department if you develop any worsening symptoms such as fever, vomiting, or increasing pain.

## 2021-11-26 NOTE — ED Notes (Signed)
Pt returned from [facility]

## 2021-11-26 NOTE — ED Provider Notes (Signed)
[facility] Provider Note   CSN: [id number] Arrival date & time: 11/26/21  [id number]     History  Chief Complaint  Patient presents with   Abdominal Pain    Jade Taylor is a 31 y.o. female.   Abdominal Pain Associated symptoms: nausea   Associated symptoms: no chest pain, no chills, no cough, no diarrhea, no fever, no hematuria, no shortness of breath and no vomiting       Jade Taylor is a 31 y.o. female with past medical history of chronic pain syndrome, PTSD, and asthma who presents to the Emergency Department complaining of gradually worsening right upper quadrant pain.  Symptoms have been present for 1 week.  Pain is been associated with nausea.  No vomiting or diarrhea.  She denies dysuria or hematuria.  Pain radiates from left flank to upper abdomen.  Pain was mild at onset and is gradually worsened, worse this morning upon waking.  Pain also worsens with movement and improves at rest.  She denies fever, chills, chest pain and shortness of breath.  Home Medications Prior to Admission medications   Medication Sig Start Date End Date Taking? Authorizing Provider  doxycycline (VIBRA-TABS) 100 MG tablet Take 1 tablet (100 mg total) by mouth 2 (two) times daily. Patient not taking: Reported on 11/26/2021 07/03/20   Florian Buff, MD  Norethindrone-Ethinyl Estradiol-Fe Biphas (LO LOESTRIN FE) 1 MG-10 MCG / 10 MCG tablet Take 1 tablet by mouth daily. Patient not taking: Reported on 11/26/2021 07/03/20   Florian Buff, MD  SSD 1 % cream APPLY EXTERNALLY TO THE AFFECTED AREA THREE TIMES DAILY Patient not taking: Reported on 11/26/2021 05/31/19   Florian Buff, MD      Allergies    Metronidazole, Penicillins, and Amoxicillin    Review of Systems   Review of Systems  Constitutional:  Negative for chills and fever.  Respiratory:  Negative for cough and shortness of breath.   Cardiovascular:  Negative for chest pain.  Gastrointestinal:  Positive for  abdominal pain and nausea. Negative for diarrhea and vomiting.  Genitourinary:  Positive for flank pain. Negative for difficulty urinating, frequency and hematuria.  Musculoskeletal:  Negative for back pain.  Skin:  Negative for rash.  Neurological:  Negative for dizziness, weakness and numbness.  All other systems reviewed and are negative.  Physical Exam Updated Vital Signs BP (!) 101/59    Pulse 75    Temp 97.9 F (36.6 C) (Oral)    Resp 20    Ht 5\' 2"  (1.575 m)    Wt 113.4 kg    LMP 11/05/2021 (Approximate)    SpO2 99%    BMI 45.73 kg/m  Physical Exam Vitals and nursing note reviewed.  Constitutional:      General: She is not in acute distress.    Appearance: She is not ill-appearing or toxic-appearing.     Comments: Patient tearful appears uncomfortable  HENT:     Mouth/Throat:     Mouth: Mucous membranes are moist.  Cardiovascular:     Rate and Rhythm: Normal rate and regular rhythm.     Pulses: Normal pulses.  Pulmonary:     Effort: Pulmonary effort is normal.     Breath sounds: Normal breath sounds.  Abdominal:     Palpations: Abdomen is soft.     Tenderness: There is abdominal tenderness (Tender to palpation of the right upper quadrant.  No guarding or rebound tenderness.  Abdomen is soft.). There is no  right CVA tenderness or left CVA tenderness.  Musculoskeletal:     Right lower leg: No edema.     Left lower leg: No edema.  Skin:    General: Skin is warm.     Capillary Refill: Capillary refill takes less than 2 seconds.     Findings: No erythema or rash.  Neurological:     General: No focal deficit present.     Mental Status: She is alert.     Sensory: No sensory deficit.     Motor: No weakness.    ED Results / Procedures / Treatments   Labs (all labs ordered are listed, but only abnormal results are displayed) Labs Reviewed  COMPREHENSIVE METABOLIC PANEL - Abnormal; Notable for the following components:      Result Value   Sodium 134 (*)    CO2 21 (*)     Glucose, Bld 114 (*)    Calcium 8.6 (*)    All other components within normal limits  CBC - Abnormal; Notable for the following components:   WBC 12.3 (*)    All other components within normal limits  LIPASE, BLOOD  URINALYSIS, ROUTINE W REFLEX MICROSCOPIC  POC URINE PREG, ED    EKG None  Radiology CT ABDOMEN PELVIS W CONTRAST  Result Date: 11/26/2021 CLINICAL DATA:  Nausea/vomiting RUQ pain, hx of cholecystectomy EXAM: CT ABDOMEN AND PELVIS WITH CONTRAST TECHNIQUE: Multidetector CT imaging of the abdomen and pelvis was performed using the standard protocol following bolus administration of intravenous contrast. CONTRAST:  73mL OMNIPAQUE IOHEXOL 350 MG/ML SOLN COMPARISON:  Ultrasound abdomen 08/17/2018, CT abdomen pelvis 08/14/2011 FINDINGS: Lower chest: No acute abnormality. Hepatobiliary: No focal liver abnormality. Status post cholecystectomy. No biliary dilatation. Pancreas: No focal lesion. Normal pancreatic contour. No surrounding inflammatory changes. No main pancreatic ductal dilatation. Spleen: Normal in size without focal abnormality. A splenule is noted. Adrenals/Urinary Tract: No adrenal nodule bilaterally. Bilateral kidneys enhance symmetrically. 2 mm calcified stone within the left kidney. No right nephrolithiasis. No ureterolithiasis bilaterally. No hydronephrosis. No hydroureter. The urinary bladder is unremarkable. Stomach/Bowel: Stomach is within normal limits. No evidence of bowel wall thickening or dilatation. Appendix appears normal. Vascular/Lymphatic: No abdominal aorta or iliac aneurysm. No abdominal, pelvic, or inguinal lymphadenopathy. Reproductive: Uterus and bilateral adnexa are unremarkable. Other: No intraperitoneal free fluid. No intraperitoneal free gas. No organized fluid collection. Musculoskeletal: No abdominal wall hernia or abnormality. No suspicious lytic or blastic osseous lesions. No acute displaced fracture. IMPRESSION: 1. Nonobstructive 2 mm left  nephrolithiasis. 2. Otherwise no acute intra-abdominal or intrapelvic abnormality in a patient status post cholecystectomy. Electronically Signed   By: Iven Finn M.D.   On: 11/26/2021 17:30   DG Chest Portable 1 View  Result Date: 11/26/2021 CLINICAL DATA:  Right upper quadrant pain for the past week. Pain worsening today with nausea. EXAM: PORTABLE CHEST 1 VIEW COMPARISON:  01/24/2019. FINDINGS: Normal heart, mediastinum and hila. Clear lungs.  No pleural effusion or pneumothorax. Skeletal structures are intact. IMPRESSION: No active disease. Electronically Signed   By: Lajean Manes M.D.   On: 11/26/2021 10:42    Procedures Procedures    Medications Ordered in ED Medications  HYDROmorphone (DILAUDID) injection 1 mg (1 mg Intravenous Given 11/26/21 1050)  ondansetron (ZOFRAN) injection 4 mg (4 mg Intravenous Given 11/26/21 1049)  HYDROmorphone (DILAUDID) injection 1 mg (1 mg Intravenous Given 11/26/21 1136)  ketorolac (TORADOL) 30 MG/ML injection 30 mg (30 mg Intravenous Given 11/26/21 1350)  iohexol (OMNIPAQUE) 350 MG/ML injection 80 mL (  80 mLs Intravenous Contrast Given 11/26/21 1710)    ED Course/ Medical Decision Making/ A&P                           Medical Decision Making  Patient here for evaluation of 1 week history of gradually worsening right upper quadrant pain.  Initially pain was described as minimal and has worsened upon waking today.  Associated with nausea but no vomiting, diarrhea or dysuria.  No history of kidney stones.  Initially concerned that she may have pulled a muscle.  History of prior cholecystectomy.  On exam, patient tearful, appears uncomfortable.  Nontoxic-appearing.  Vital signs reassuring.  No tachypnea, tachycardia or hypoxia.  She has focal tenderness to palpation of the right upper quadrant.  There is no guarding or rebound.  No peritoneal signs.  Differential at this time would include kidney stone, acute appendicitis, UTI/pyelonephritis GERD,  musculoskeletal injury.  We will address patient's pain with IV medication here.  She will need labs, urinalysis and likely CT imaging of the abdomen and pelvis.  On recheck, patient reports brief improvement of pain after IV hydromorphone.  Antiemetic and Toradol also given.  Labs interpreted by me show mild leukocytosis without left shift.  Lipase unremarkable.  No significant electrolyte derangement.  Urine pregnancy and urinalysis also unremarkable.  She will need CT imaging of the abdomen for further evaluation.  CT unavailable at this time here.  Patient agreeable to transfer to [facility] and to return here for further evaluation and disposition.  CT abdomen and pelvis results and imaging reviewed by me.  Without evidence of acute intra-abdominal or intrapelvic abnormality.  There is a nonobstructive 2 mm left nephrolithiasis.  I agree with radiology interpretation.  I have discussed CT findings with the patient and informed her of left-sided nephrolithiasis.  I do not feel this is related to her current right upper quadrant pain.  Felt this is an incidental finding.  Source of patient's right upper quadrant pain is unclear at this time.  Doubt emergent process.  Patient reports feeling better at this time.  I feel that she is appropriate for discharge home.  She is agreeable to close outpatient follow-up and ER return precautions were discussed.  We will treat with short course of NSAID, antiemetic and muscle relaxer.        Final Clinical Impression(s) / ED Diagnoses Final diagnoses:  Right upper quadrant pain    Rx / DC Orders ED Discharge Orders     None         Kem Parkinson, PA-C [id number]    Lianne Cure, DO [id number]

## 2021-11-26 NOTE — ED Notes (Signed)
[facility] here to take pt to ct at [facility]

## 2022-01-21 ENCOUNTER — Other Ambulatory Visit: Payer: Medicaid Other | Admitting: Adult Health

## 2022-02-20 ENCOUNTER — Ambulatory Visit (INDEPENDENT_AMBULATORY_CARE_PROVIDER_SITE_OTHER): Payer: Medicaid Other | Admitting: Women's Health

## 2022-02-20 ENCOUNTER — Encounter: Payer: Self-pay | Admitting: Women's Health

## 2022-02-20 VITALS — BP 114/83 | HR 97 | Ht 62.0 in | Wt 235.0 lb

## 2022-02-20 DIAGNOSIS — Z0000 Encounter for general adult medical examination without abnormal findings: Secondary | ICD-10-CM | POA: Diagnosis not present

## 2022-02-20 DIAGNOSIS — Z01419 Encounter for gynecological examination (general) (routine) without abnormal findings: Secondary | ICD-10-CM

## 2022-02-20 DIAGNOSIS — Z30011 Encounter for initial prescription of contraceptive pills: Secondary | ICD-10-CM | POA: Diagnosis not present

## 2022-02-20 DIAGNOSIS — Z3202 Encounter for pregnancy test, result negative: Secondary | ICD-10-CM

## 2022-02-20 LAB — POCT URINE PREGNANCY: Preg Test, Ur: NEGATIVE

## 2022-02-20 MED ORDER — LO LOESTRIN FE 1 MG-10 MCG / 10 MCG PO TABS: 1.0000 | ORAL_TABLET | Freq: Every day | ORAL | 3 refills | Status: DC

## 2022-02-20 NOTE — Progress Notes (Signed)
? ?WELL-WOMAN EXAMINATION ?Patient name: Jade Taylor MRN [id number]  Date of birth: 1991-09-06 ?Chief Complaint:   ?Gynecologic Exam (Last pap 07/03/20 normal) ? ?History of Present Illness:   ?Jade Taylor is a 31 y.o. [phone] Caucasian female being seen today for a routine well-woman exam.  ?Current complaints: none, out of LoLoestrin x , wants to restart. TAking topamax for weight loss, discussed this can decrease effectiveness of COCs, recommended condoms/withdrawal ? ?does not desire labs ?Patient's last menstrual period was 02/11/2022. ?The current method of family planning is none.  ?Last pap 07/03/20. Results were: NILM w/ HRHPV negative. H/O abnormal pap: no ?Last mammogram: never. Results were: N/A. Family h/o breast cancer: no ?Last colonoscopy: never. Results were: N/A. Family h/o colorectal cancer: no ? ? ?  02/20/2022  ?  2:32 PM 07/03/2020  ?  2:52 PM 07/10/2016  ?  2:59 PM  ?Depression screen PHQ 2/9  ?Decreased Interest 0 0 0  ?Down, Depressed, Hopeless 0 0 0  ?PHQ - 2 Score 0 0 0  ?Altered sleeping 2 1 1   ?Tired, decreased energy 1 1 0  ?Change in appetite 0 0 0  ?Feeling bad or failure about yourself  0 0 0  ?Trouble concentrating 2 1 0  ?Moving slowly or fidgety/restless 1 0 0  ?Suicidal thoughts 0 0 0  ?PHQ-9 Score 6 3 1   ?Difficult doing work/chores  Not difficult at all   ? ?  ? ?  02/20/2022  ?  2:34 PM 07/03/2020  ?  2:53 PM  ?GAD 7 : Generalized Anxiety Score  ?Nervous, Anxious, on Edge 1 1  ?Control/stop worrying 1 0  ?Worry too much - different things 1 0  ?Trouble relaxing 1 0  ?Restless 1 1  ?Easily annoyed or irritable 0 1  ?Afraid - awful might happen 1 1  ?Total GAD 7 Score 6 4  ?Anxiety Difficulty  Not difficult at all  ? ? ? ?Review of Systems:   ?Pertinent items are noted in HPI ?Denies any headaches, blurred vision, fatigue, shortness of breath, chest pain, abdominal pain, abnormal vaginal discharge/itching/odor/irritation, problems with periods, bowel movements,  urination, or intercourse unless otherwise stated above. ?Pertinent History Reviewed:  ?Reviewed past medical,surgical, social and family history.  ?Reviewed problem list, medications and allergies. ?Physical Assessment:  ? ?Vitals:  ? 02/20/22 1423  ?BP: 114/83  ?Pulse: 97  ?Weight: 235 lb (106.6 kg)  ?Height: 5\' 2"  (1.575 m)  ?Body mass index is 42.98 kg/m?. ?  ?     Physical Examination:  ? General appearance - well appearing, and in no distress ? Mental status - alert, oriented to person, place, and time ? Psych:  She has a normal mood and affect ? Skin - warm and dry, normal color, no suspicious lesions noted ? Chest - effort normal, all lung fields clear to auscultation bilaterally ? Heart - normal rate and regular rhythm ? Neck:  midline trachea, no thyromegaly or nodules ? Breasts - breasts appear normal, no suspicious masses, no skin or nipple changes or  axillary nodes ? Abdomen - soft, nontender, nondistended, no masses or organomegaly ? Pelvic - VULVA: normal appearing vulva with no masses, tenderness or lesions  VAGINA: normal appearing vagina with normal color and discharge, no lesions  CERVIX: normal appearing cervix without discharge or lesions, no CMT ? Thin prep pap is not done  ? UTERUS: uterus is felt to be normal size, shape, consistency and nontender  ? ADNEXA: No adnexal  masses or tenderness noted. ? Extremities:  No swelling or varicosities noted ? ?Chaperone: Faith Rogue   ? ?Results for orders placed or performed in visit on 02/20/22 (from the past 24 hour(s))  ?POCT urine pregnancy  ? Collection Time: 02/20/22  2:31 PM  ?Result Value Ref Range  ? Preg Test, Ur Negative Negative  ?  ?Assessment & Plan:  ?1) Well-Woman Exam ? ?2) Contraception management> refilled LoLo, use condoms/withdrawal while on topamax d/t potential for decreased effectiveness ? ?Labs/procedures today: exam ? ?Mammogram: @ 32yo, or sooner if problems ?Colonoscopy: @ 32yo, or sooner if problems ? ?Orders Placed This  Encounter  ?Procedures  ? POCT urine pregnancy  ? ? ?Meds:  ?Meds ordered this encounter  ?Medications  ? Norethindrone-Ethinyl Estradiol-Fe Biphas (LO LOESTRIN FE) 1 MG-10 MCG / 10 MCG tablet  ?  Sig: Take 1 tablet by mouth daily.  ?  Dispense:  90 tablet  ?  Refill:  3  ?  Order Specific Question:   Supervising Provider  ?  Answer:   Duane Lope H [[id number]]  ? ? ?Follow-up: Return in about 1 year (around 02/21/2023) for Pap & physical. ? ?Cheral Marker CNM, WHNP-BC ?02/20/2022 ?2:51 PM  ?

## 2022-06-04 ENCOUNTER — Ambulatory Visit: Payer: Medicaid Other | Admitting: *Deleted

## 2022-06-16 ENCOUNTER — Emergency Department (HOSPITAL_COMMUNITY)
Admission: EM | Admit: 2022-06-16 | Discharge: 2022-06-17 | Disposition: A | Discharge status: Home / Self Care | Payer: Medicaid Other | Attending: Emergency Medicine | Admitting: Emergency Medicine

## 2022-06-16 ENCOUNTER — Encounter (HOSPITAL_COMMUNITY): Payer: Self-pay

## 2022-06-16 ENCOUNTER — Other Ambulatory Visit: Payer: Self-pay

## 2022-06-16 DIAGNOSIS — O219 Vomiting of pregnancy, unspecified: Secondary | ICD-10-CM | POA: Diagnosis not present

## 2022-06-16 DIAGNOSIS — R197 Diarrhea, unspecified: Secondary | ICD-10-CM | POA: Diagnosis not present

## 2022-06-16 DIAGNOSIS — R112 Nausea with vomiting, unspecified: Secondary | ICD-10-CM

## 2022-06-16 LAB — CBC
HCT: 43.3 % (ref 36.0–46.0)
Hemoglobin: 15 g/dL (ref 12.0–15.0)
MCH: 30.4 pg (ref 26.0–34.0)
MCHC: 34.6 g/dL (ref 30.0–36.0)
MCV: 87.7 fL (ref 80.0–100.0)
Platelets: 224 10*3/uL (ref 150–400)
RBC: 4.94 MIL/uL (ref 3.87–5.11)
RDW: 13.2 % (ref 11.5–15.5)
WBC: 16.9 10*3/uL — ABNORMAL HIGH (ref 4.0–10.5)
nRBC: 0 % (ref 0.0–0.2)

## 2022-06-16 LAB — COMPREHENSIVE METABOLIC PANEL
ALT: 21 U/L (ref 0–44)
AST: 18 U/L (ref 15–41)
Albumin: 3.7 g/dL (ref 3.5–5.0)
Alkaline Phosphatase: 69 U/L (ref 38–126)
Anion gap: 10 (ref 5–15)
BUN: 7 mg/dL (ref 6–20)
CO2: 20 mmol/L — ABNORMAL LOW (ref 22–32)
Calcium: 9 mg/dL (ref 8.9–10.3)
Chloride: 104 mmol/L (ref 98–111)
Creatinine, Ser: 0.48 mg/dL (ref 0.44–1.00)
GFR, Estimated: >60 mL/min (ref >60)
Glucose, Bld: 119 mg/dL — ABNORMAL HIGH (ref 70–99)
Potassium: 3.5 mmol/L (ref 3.5–5.1)
Sodium: 134 mmol/L — ABNORMAL LOW (ref 135–145)
Total Bilirubin: 0.6 mg/dL (ref 0.3–1.2)
Total Protein: 7.5 g/dL (ref 6.5–8.1)

## 2022-06-16 LAB — LIPASE, BLOOD: Lipase: 25 U/L (ref 11–51)

## 2022-06-16 MED ORDER — METOCLOPRAMIDE HCL 10 MG PO TABS: 10.0000 mg | ORAL_TABLET | Freq: Four times a day (QID) | ORAL | 0 refills | Status: DC | PRN

## 2022-06-16 MED ORDER — ONDANSETRON HCL 4 MG/2ML IJ SOLN: 4.0000 mg | Freq: Once | INTRAMUSCULAR | Status: DC | PRN

## 2022-06-16 MED ORDER — ONDANSETRON HCL 4 MG/2ML IJ SOLN
4.0000 mg | Freq: Once | INTRAMUSCULAR | Status: AC
  Administered 2022-06-16: 4 mg via INTRAVENOUS
  Filled 2022-06-16: qty 2

## 2022-06-16 MED ORDER — METOCLOPRAMIDE HCL 5 MG/ML IJ SOLN
5.0000 mg | Freq: Once | INTRAMUSCULAR | Status: AC
  Administered 2022-06-16: 5 mg via INTRAVENOUS
  Filled 2022-06-16: qty 2

## 2022-06-16 MED ORDER — LACTATED RINGERS IV BOLUS
1000.0000 mL | Freq: Once | INTRAVENOUS | Status: AC
Start: 2022-06-16 — End: 2022-06-16
  Administered 2022-06-16: 1000 mL via INTRAVENOUS

## 2022-06-16 NOTE — ED Provider Notes (Signed)
  Provider Note MRN:  [id number]  Arrival date & time: 06/17/22    ED Course and Medical Decision Making  Assumed care from Dr. Hyacinth Meeker at shift change.  Nausea vomiting diarrhea in first trimester of pregnancy will reassess after fluids, awaiting urinalysis.  Patient feeling generally well, wants to go home and get some sleep.  Urinalysis seems to be mildly contaminated, does not appear infected.  Appropriate for discharge.  Procedures  Final Clinical Impressions(s) / ED Diagnoses     ICD-10-CM   1. Nausea vomiting and diarrhea  R11.2    R19.7       ED Discharge Orders          Ordered    ondansetron (ZOFRAN-ODT) 4 MG disintegrating tablet  Every 8 hours PRN,   Status:  Discontinued        06/17/22 0010    metoCLOPramide (REGLAN) 10 MG tablet  Every 6 hours PRN        06/16/22 2343              Discharge Instructions      You were evaluated in the Emergency Department and after careful evaluation, we did not find any emergent condition requiring admission or further testing in the hospital.  Your exam/testing today is overall reassuring.  Symptoms seem to be due to a viral gastroenteritis or possibly morning sickness.  Can use the Reglan at home as needed, drink plenty of fluids, get plenty of rest.  Please return to the Emergency Department if you experience any worsening of your condition.   Thank you for allowing [place] to be a part of your care.      Elmer Sow. Pilar Plate, MD [facility] Emergency Medicine [facility] [email or web address]    Sabas Sous, MD 06/17/22 [phone]

## 2022-06-16 NOTE — ED Provider Notes (Signed)
[facility] EMERGENCY DEPARTMENT Provider Note   CSN: [id number] Arrival date & time: 06/16/22  2046     History  Chief Complaint  Patient presents with   Emesis   Diarrhea    Jade Taylor is a 31 y.o. female.  Jade Taylor is a 31 yo female with no PMH who is actively pregnant in her first trimester presenting today for nausea, vomiting and diarrhea. Started 2 days ago after she returned from a vacation in [place]. V/D occurs every 30 minutes. She reports that she "can't keep anything down". Has not tried anything to make her symptoms better. Reported gall bladder was removed five years ago. Denies abdominal pain, hemoptysis, or bloody stools. Denies dysuria. Denies fever and sick contacts.     Emesis Associated symptoms: diarrhea   Diarrhea Associated symptoms: vomiting        Home Medications Prior to Admission medications   Medication Sig Start Date End Date Taking? Authorizing Provider  metoCLOPramide (REGLAN) 10 MG tablet Take 1 tablet (10 mg total) by mouth every 6 (six) hours as needed for up to 30 doses for nausea or vomiting. 06/16/22  Yes Gareth Eagle, PA-C  Norethindrone-Ethinyl Estradiol-Fe Biphas (LO LOESTRIN FE) 1 MG-10 MCG / 10 MCG tablet Take 1 tablet by mouth daily. 02/20/22   Cheral Marker, CNM  phentermine 15 MG capsule Take 15 mg by mouth every morning. 02/11/22   [provider]  topiramate (TOPAMAX) 50 MG tablet Take 50 mg by mouth daily. 02/11/22   [provider]  Vitamin D, Ergocalciferol, (DRISDOL) 1.25 MG (50000 UNIT) CAPS capsule Take 50,000 Units by mouth once a week. 02/09/22   [provider]      Allergies    Metronidazole, Penicillins, and Amoxicillin    Review of Systems   Review of Systems  Gastrointestinal:  Positive for diarrhea and vomiting.    Physical Exam Updated Vital Signs BP 129/70 (BP Location: Right Arm)   Pulse (!) 106   Temp 97.9 F (36.6 C) (Oral)   Resp 19   Ht 5\' 2"  (1.575  m)   Wt 104.3 kg   LMP 05/14/2022 (Exact Date)   SpO2 97%   BMI 42.07 kg/m  Physical Exam Vitals and nursing note reviewed.  Constitutional:      General: She is not in acute distress.    Appearance: Normal appearance. She is obese. She is ill-appearing.  HENT:     Head: Normocephalic and atraumatic.     Mouth/Throat:     Mouth: Mucous membranes are moist.  Eyes:     General:        Right eye: No discharge.        Left eye: No discharge.     Conjunctiva/sclera: Conjunctivae normal.  Cardiovascular:     Rate and Rhythm: Regular rhythm. Tachycardia present.     Pulses: Normal pulses.     Heart sounds: Normal heart sounds.  Pulmonary:     Effort: Pulmonary effort is normal.     Breath sounds: Normal breath sounds.  Abdominal:     General: Abdomen is flat.     Palpations: Abdomen is soft.  Skin:    General: Skin is warm and dry.  Neurological:     General: No focal deficit present.     Mental Status: She is alert.  Psychiatric:        Mood and Affect: Mood normal.     ED Results / Procedures / Treatments   Labs (all  labs ordered are listed, but only abnormal results are displayed) Labs Reviewed  COMPREHENSIVE METABOLIC PANEL - Abnormal; Notable for the following components:      Result Value   Sodium 134 (*)    CO2 20 (*)    Glucose, Bld 119 (*)    All other components within normal limits  CBC - Abnormal; Notable for the following components:   WBC 16.9 (*)    All other components within normal limits  LIPASE, BLOOD  URINALYSIS, ROUTINE W REFLEX MICROSCOPIC      Medications Ordered in ED Medications  ondansetron (ZOFRAN) injection 4 mg (has no administration in time range)  lactated ringers bolus 1,000 mL (0 mLs Intravenous Stopped 06/16/22 2326)  metoCLOPramide (REGLAN) injection 5 mg (5 mg Intravenous Given 06/16/22 2218)    ED Course/ Medical Decision Making/ A&P                           Medical Decision Making Eddie is presenting to the  emergency department for n/v/d. Likely acute gastroenteritis given her presentation but will obtain abdominal labs to r/o more sinister pathology. Ordered reglan for N/V. Vitals revealed tachycardia. Ordered 1 L bolus of lactated ringers. If labs are unremarkable and vitals remain stable and/or improve will plan to discharge home with a prescription for reglan to treat her symptoms as needed and to encourage PO fluids at home as tolerated.   Amount and/or Complexity of Data Reviewed External Data Reviewed: labs and notes. Labs: ordered.  Risk Prescription drug management.           Final Clinical Impression(s) / ED Diagnoses Final diagnoses:  Nausea vomiting and diarrhea    Rx / DC Orders ED Discharge Orders          Ordered    metoCLOPramide (REGLAN) 10 MG tablet  Every 6 hours PRN        06/16/22 2343              Gareth Eagle, PA-C 06/16/22 2350    Eber Hong, MD 06/20/22 [phone]

## 2022-06-16 NOTE — ED Triage Notes (Signed)
Pov from home. N/v/d x2 days. Says its appx every 30 mins. No one sick at home.  Says she is pregnant and goes Wednesday for confirm [place].  LMP 05/14/2022

## 2022-06-16 NOTE — ED Notes (Signed)
ED Provider at bedside. 

## 2022-06-17 LAB — URINALYSIS, ROUTINE W REFLEX MICROSCOPIC
Bilirubin Urine: NEGATIVE
Glucose, UA: NEGATIVE mg/dL
Hgb urine dipstick: NEGATIVE
Ketones, ur: 20 mg/dL — AB
Leukocytes,Ua: NEGATIVE
Nitrite: NEGATIVE
Protein, ur: 30 mg/dL — AB
Specific Gravity, Urine: 1.017 (ref 1.005–1.030)
pH: 7 (ref 5.0–8.0)

## 2022-06-17 MED ORDER — ONDANSETRON 4 MG PO TBDP: 4.0000 mg | ORAL_TABLET | Freq: Three times a day (TID) | ORAL | 0 refills | Status: DC | PRN

## 2022-06-17 NOTE — Discharge Instructions (Signed)
You were evaluated in the Emergency Department and after careful evaluation, we did not find any emergent condition requiring admission or further testing in the hospital.  Your exam/testing today is overall reassuring.  Symptoms seem to be due to a viral gastroenteritis or possibly morning sickness.  Can use the Reglan at home as needed, drink plenty of fluids, get plenty of rest.  Please return to the Emergency Department if you experience any worsening of your condition.   Thank you for allowing [place] to be a part of your care.

## 2022-06-18 ENCOUNTER — Other Ambulatory Visit: Payer: Self-pay | Admitting: Obstetrics & Gynecology

## 2022-06-18 DIAGNOSIS — O3680X0 Pregnancy with inconclusive fetal viability, not applicable or unspecified: Secondary | ICD-10-CM

## 2022-06-19 ENCOUNTER — Ambulatory Visit (INDEPENDENT_AMBULATORY_CARE_PROVIDER_SITE_OTHER): Payer: Medicaid Other

## 2022-06-19 DIAGNOSIS — Z3A09 9 weeks gestation of pregnancy: Secondary | ICD-10-CM

## 2022-06-19 DIAGNOSIS — O3680X0 Pregnancy with inconclusive fetal viability, not applicable or unspecified: Secondary | ICD-10-CM

## 2022-06-19 NOTE — Progress Notes (Signed)
[place] 10+5 wks,single IUP,FHR 161 bpm,CRL 38.46 mm,normal ovaries

## 2022-06-27 ENCOUNTER — Other Ambulatory Visit: Payer: Self-pay | Admitting: Obstetrics & Gynecology

## 2022-06-27 DIAGNOSIS — Z3682 Encounter for antenatal screening for nuchal translucency: Secondary | ICD-10-CM

## 2022-06-28 ENCOUNTER — Ambulatory Visit (INDEPENDENT_AMBULATORY_CARE_PROVIDER_SITE_OTHER): Payer: Medicaid Other

## 2022-06-28 ENCOUNTER — Other Ambulatory Visit: Payer: Medicaid Other

## 2022-06-28 DIAGNOSIS — Z3682 Encounter for antenatal screening for nuchal translucency: Secondary | ICD-10-CM

## 2022-06-28 DIAGNOSIS — Z3480 Encounter for supervision of other normal pregnancy, unspecified trimester: Secondary | ICD-10-CM

## 2022-06-28 DIAGNOSIS — Z3A12 12 weeks gestation of pregnancy: Secondary | ICD-10-CM | POA: Diagnosis not present

## 2022-06-28 NOTE — Progress Notes (Signed)
[place] 12 wks,measurements c/w dates,FHR 155 bpm,anterior placenta,normal ovaries,NB present,NT 1.5 mm,CRL 56.34 mm

## 2022-07-01 ENCOUNTER — Ambulatory Visit: Payer: Medicaid Other | Admitting: *Deleted

## 2022-07-01 ENCOUNTER — Ambulatory Visit (INDEPENDENT_AMBULATORY_CARE_PROVIDER_SITE_OTHER): Payer: Medicaid Other | Admitting: Women's Health

## 2022-07-01 ENCOUNTER — Encounter: Payer: Self-pay | Admitting: Women's Health

## 2022-07-01 VITALS — BP 114/75 | HR 81 | Wt 232.0 lb

## 2022-07-01 DIAGNOSIS — Z3A12 12 weeks gestation of pregnancy: Secondary | ICD-10-CM | POA: Diagnosis not present

## 2022-07-01 DIAGNOSIS — Z8759 Personal history of other complications of pregnancy, childbirth and the puerperium: Secondary | ICD-10-CM

## 2022-07-01 DIAGNOSIS — Z98891 History of uterine scar from previous surgery: Secondary | ICD-10-CM | POA: Diagnosis not present

## 2022-07-01 DIAGNOSIS — Z3480 Encounter for supervision of other normal pregnancy, unspecified trimester: Secondary | ICD-10-CM | POA: Diagnosis not present

## 2022-07-01 DIAGNOSIS — O0991 Supervision of high risk pregnancy, unspecified, first trimester: Secondary | ICD-10-CM | POA: Diagnosis not present

## 2022-07-01 DIAGNOSIS — O0990 Supervision of high risk pregnancy, unspecified, unspecified trimester: Secondary | ICD-10-CM | POA: Insufficient documentation

## 2022-07-01 LAB — POCT URINALYSIS DIPSTICK OB
Blood, UA: NEGATIVE
Glucose, UA: NEGATIVE
Ketones, UA: NEGATIVE
Leukocytes, UA: NEGATIVE
Nitrite, UA: NEGATIVE
POC,PROTEIN,UA: NEGATIVE

## 2022-07-01 MED ORDER — ASPIRIN 81 MG PO TBEC: 81.0000 mg | DELAYED_RELEASE_TABLET | Freq: Every day | ORAL | 3 refills | Status: DC

## 2022-07-01 NOTE — Patient Instructions (Signed)
Jade Taylor, thank you for choosing our office today! We appreciate the opportunity to meet your healthcare needs. You may receive a short survey by mail, e-mail, or through [employer]. If you are happy with your care we would appreciate if you could take just a few minutes to complete the survey questions. We read all of your comments and take your feedback very seriously. Thank you again for choosing our office.  [facility] for [employer] [facility] Team at [facility]  [facility] at [facility] ([address] [place], [place] [address]) Entrance C, located off of [address] Free 24/7 valet parking   Nausea & Vomiting Have saltine crackers or pretzels by your bed and eat a few bites before you raise your head out of bed in the morning Eat small frequent meals throughout the day instead of large meals Drink plenty of fluids throughout the day to stay hydrated, just don't drink a lot of fluids with your meals.  This can make your stomach fill up faster making you feel sick Do not brush your teeth right after you eat Products with real ginger are good for nausea, like ginger ale and ginger hard candy Make sure it says made with real ginger! Sucking on sour candy like lemon heads is also good for nausea If your prenatal vitamins make you nauseated, take them at night so you will sleep through the nausea Sea Bands If you feel like you need medicine for the nausea & vomiting please let us know If you are unable to keep any fluids or food down please let us know   Constipation Drink plenty of fluid, preferably water, throughout the day Eat foods high in fiber such as fruits, vegetables, and grains Exercise, such as walking, is a good way to keep your bowels regular Drink warm fluids, especially warm prune juice, or decaf coffee Eat a 1/2 cup of real oatmeal (not instant), 1/2 cup applesauce, and 1/2-1 cup warm prune juice every day If needed, you may take Colace (docusate sodium) stool  softener once or twice a day to help keep the stool soft.  If you still are having problems with constipation, you may take Miralax once daily as needed to help keep your bowels regular.   Home Blood Pressure Monitoring for Patients   Your provider has recommended that you check your blood pressure (BP) at least once a week at home. If you do not have a blood pressure cuff at home, one will be provided for you. Contact your provider if you have not received your monitor within 1 week.   Helpful Tips for Accurate Home Blood Pressure Checks  Don't smoke, exercise, or drink caffeine 30 minutes before checking your BP Use the restroom before checking your BP (a full bladder can raise your pressure) Relax in a comfortable upright chair Feet on the ground Left arm resting comfortably on a flat surface at the level of your heart Legs uncrossed Back supported Sit quietly and don't talk Place the cuff on your bare arm Adjust snuggly, so that only two fingertips can fit between your skin and the top of the cuff Check 2 readings separated by at least one minute Keep a log of your BP readings For a visual, please reference this diagram: http://ccnc.care/bpdiagram  Provider Name: [facility]     Phone: [phone]  Zone 1: ALL CLEAR  Continue to monitor your symptoms:  BP reading is less than 140 (top number) or less than 90 (bottom  number)  No right upper stomach pain No headaches or seeing spots No feeling nauseated or throwing up No swelling in face and hands  Zone 2: CAUTION Call your doctor's office for any of the following:  BP reading is greater than 140 (top number) or greater than 90 (bottom number)  Stomach pain under your ribs in the middle or right side Headaches or seeing spots Feeling nauseated or throwing up Swelling in face and hands  Zone 3: EMERGENCY  Seek immediate medical care if you have any of the following:  BP reading is greater than160 (top number) or  greater than 110 (bottom number) Severe headaches not improving with Tylenol Serious difficulty catching your breath Any worsening symptoms from Zone 2    First Trimester of Pregnancy The first trimester of pregnancy is from week 1 until the end of week 12 (months 1 through 3). A week after a sperm fertilizes an egg, the egg will implant on the wall of the uterus. This embryo will begin to develop into a baby. Genes from you and your partner are forming the baby. The female genes determine whether the baby is a boy or a girl. At 6-8 weeks, the eyes and face are formed, and the heartbeat can be seen on ultrasound. At the end of 12 weeks, all the baby's organs are formed.  Now that you are pregnant, you will want to do everything you can to have a healthy baby. Two of the most important things are to get good prenatal care and to follow your health care provider's instructions. Prenatal care is all the medical care you receive before the baby's birth. This care will help prevent, find, and treat any problems during the pregnancy and childbirth. BODY CHANGES Your body goes through many changes during pregnancy. The changes vary from woman to woman.  You may gain or lose a couple of pounds at first. You may feel sick to your stomach (nauseous) and throw up (vomit). If the vomiting is uncontrollable, call your health care provider. You may tire easily. You may develop headaches that can be relieved by medicines approved by your health care provider. You may urinate more often. Painful urination may mean you have a bladder infection. You may develop heartburn as a result of your pregnancy. You may develop constipation because certain hormones are causing the muscles that push waste through your intestines to slow down. You may develop hemorrhoids or swollen, bulging veins (varicose veins). Your breasts may begin to grow larger and become tender. Your nipples may stick out more, and the tissue that  surrounds them (areola) may become darker. Your gums may bleed and may be sensitive to brushing and flossing. Dark spots or blotches (chloasma, mask of pregnancy) may develop on your face. This will likely fade after the baby is born. Your menstrual periods will stop. You may have a loss of appetite. You may develop cravings for certain kinds of food. You may have changes in your emotions from day to day, such as being excited to be pregnant or being concerned that something may go wrong with the pregnancy and baby. You may have more vivid and strange dreams. You may have changes in your hair. These can include thickening of your hair, rapid growth, and changes in texture. Some women also have hair loss during or after pregnancy, or hair that feels dry or thin. Your hair will most likely return to normal after your baby is born. WHAT TO EXPECT AT YOUR PRENATAL  VISITS During a routine prenatal visit: You will be weighed to make sure you and the baby are growing normally. Your blood pressure will be taken. Your abdomen will be measured to track your baby's growth. The fetal heartbeat will be listened to starting around week 10 or 12 of your pregnancy. Test results from any previous visits will be discussed. Your health care provider may ask you: How you are feeling. If you are feeling the baby move. If you have had any abnormal symptoms, such as leaking fluid, bleeding, severe headaches, or abdominal cramping. If you have any questions. Other tests that may be performed during your first trimester include: Blood tests to find your blood type and to check for the presence of any previous infections. They will also be used to check for low iron levels (anemia) and Rh antibodies. Later in the pregnancy, blood tests for diabetes will be done along with other tests if problems develop. Urine tests to check for infections, diabetes, or protein in the urine. An ultrasound to confirm the proper growth  and development of the baby. An amniocentesis to check for possible genetic problems. Fetal screens for spina bifida and Down syndrome. You may need other tests to make sure you and the baby are doing well. HOME CARE INSTRUCTIONS  Medicines Follow your health care provider's instructions regarding medicine use. Specific medicines may be either safe or unsafe to take during pregnancy. Take your prenatal vitamins as directed. If you develop constipation, try taking a stool softener if your health care provider approves. Diet Eat regular, well-balanced meals. Choose a variety of foods, such as meat or vegetable-based protein, fish, milk and low-fat dairy products, vegetables, fruits, and whole grain breads and cereals. Your health care provider will help you determine the amount of weight gain that is right for you. Avoid raw meat and uncooked cheese. These carry germs that can cause birth defects in the baby. Eating four or five small meals rather than three large meals a day may help relieve nausea and vomiting. If you start to feel nauseous, eating a few soda crackers can be helpful. Drinking liquids between meals instead of during meals also seems to help nausea and vomiting. If you develop constipation, eat more high-fiber foods, such as fresh vegetables or fruit and whole grains. Drink enough fluids to keep your urine clear or pale yellow. Activity and Exercise Exercise only as directed by your health care provider. Exercising will help you: Control your weight. Stay in shape. Be prepared for labor and delivery. Experiencing pain or cramping in the lower abdomen or low back is a good sign that you should stop exercising. Check with your health care provider before continuing normal exercises. Try to avoid standing for long periods of time. Move your legs often if you must stand in one place for a long time. Avoid heavy lifting. Wear low-heeled shoes, and practice good posture. You may  continue to have sex unless your health care provider directs you otherwise. Relief of Pain or Discomfort Wear a good support bra for breast tenderness.   Take warm sitz baths to soothe any pain or discomfort caused by hemorrhoids. Use hemorrhoid cream if your health care provider approves.   Rest with your legs elevated if you have leg cramps or low back pain. If you develop varicose veins in your legs, wear support hose. Elevate your feet for 15 minutes, 3-4 times a day. Limit salt in your diet. Prenatal Care Schedule your prenatal visits by the  twelfth week of pregnancy. They are usually scheduled monthly at first, then more often in the last 2 months before delivery. Write down your questions. Take them to your prenatal visits. Keep all your prenatal visits as directed by your health care provider. Safety Wear your seat belt at all times when driving. Make a list of emergency phone numbers, including numbers for family, friends, the hospital, and police and fire departments. General Tips Ask your health care provider for a referral to a local prenatal education class. Begin classes no later than at the beginning of month 6 of your pregnancy. Ask for help if you have counseling or nutritional needs during pregnancy. Your health care provider can offer advice or refer you to specialists for help with various needs. Do not use hot tubs, steam rooms, or saunas. Do not douche or use tampons or scented sanitary pads. Do not cross your legs for long periods of time. Avoid cat litter boxes and soil used by cats. These carry germs that can cause birth defects in the baby and possibly loss of the fetus by miscarriage or stillbirth. Avoid all smoking, herbs, alcohol, and medicines not prescribed by your health care provider. Chemicals in these affect the formation and growth of the baby. Schedule a dentist appointment. At home, brush your teeth with a soft toothbrush and be gentle when you floss. SEEK  MEDICAL CARE IF:  You have dizziness. You have mild pelvic cramps, pelvic pressure, or nagging pain in the abdominal area. You have persistent nausea, vomiting, or diarrhea. You have a bad smelling vaginal discharge. You have pain with urination. You notice increased swelling in your face, hands, legs, or ankles. SEEK IMMEDIATE MEDICAL CARE IF:  You have a fever. You are leaking fluid from your vagina. You have spotting or bleeding from your vagina. You have severe abdominal cramping or pain. You have rapid weight gain or loss. You vomit blood or material that looks like coffee grounds. You are exposed to [place] measles and have never had them. You are exposed to fifth disease or chickenpox. You develop a severe headache. You have shortness of breath. You have any kind of trauma, such as from a fall or a car accident. Document Released: 11/05/2001 Document Revised: 03/28/2014 Document Reviewed: 09/21/2013 [facility] Patient Information 2015 [place], [place]. This information is not intended to replace advice given to you by your health care provider. Make sure you discuss any questions you have with your health care provider.

## 2022-07-01 NOTE — Progress Notes (Signed)
INITIAL OBSTETRICAL VISIT Patient name: Jade Taylor MRN [id number]  Date of birth: October 01, 1991 Chief Complaint:   Initial Prenatal Visit  History of Present Illness:   Jade Taylor is a 31 y.o. [id number] Caucasian female at [redacted]w[redacted]d by [place] at 10 weeks with an Estimated Date of Delivery: 01/10/23 being seen today for her initial obstetrical visit.   Patient's last menstrual period was 04/13/2022 (approximate). Her obstetrical history is significant for  24wk IUFD (20wk fetal size) w/ true knot, EAB x 1, SAB x 1, 28wk IUFD w/ small 1cm placental bleed on path/?abruption, neg anticardiolipin work-up [place] 2016, then term PCS for failed IOL @ 4cm  .   Today she reports no complaints. Some anxiety r/t previous pregnancy hx, but doing well, declines IBH referral at this time.  Last pap 07/03/20. Results were: NILM w/ HRHPV negative     07/01/2022   10:40 AM 02/20/2022    2:32 PM 07/03/2020    2:52 PM 07/10/2016    2:59 PM  Depression screen PHQ 2/9  Decreased Interest 0 0 0 0  Down, Depressed, Hopeless 0 0 0 0  PHQ - 2 Score 0 0 0 0  Altered sleeping 0 2 1 1   Tired, decreased energy 1 1 1  0  Change in appetite 0 0 0 0  Feeling bad or failure about yourself  0 0 0 0  Trouble concentrating 0 2 1 0  Moving slowly or fidgety/restless 0 1 0 0  Suicidal thoughts 0 0 0 0  PHQ-9 Score 1 6 3 1   Difficult doing work/chores   Not difficult at all         07/01/2022   10:40 AM 02/20/2022    2:34 PM 07/03/2020    2:53 PM  GAD 7 : Generalized Anxiety Score  Nervous, Anxious, on Edge 0 1 1  Control/stop worrying 0 1 0  Worry too much - different things 0 1 0  Trouble relaxing 0 1 0  Restless 0 1 1  Easily annoyed or irritable 0 0 1  Afraid - awful might happen 0 1 1  Total GAD 7 Score 0 6 4  Anxiety Difficulty   Not difficult at all     Review of Systems:   Pertinent items are noted in HPI Denies cramping/contractions, leakage of fluid, vaginal bleeding, abnormal vaginal discharge w/  itching/odor/irritation, headaches, visual changes, shortness of breath, chest pain, abdominal pain, severe nausea/vomiting, or problems with urination or bowel movements unless otherwise stated above.  Pertinent History Reviewed:  Reviewed past medical,surgical, social, obstetrical and family history.  Reviewed problem list, medications and allergies. OB History  Gravida Para Term Preterm AB Living  6 3 1 2 2 1   SAB IAB Ectopic Multiple Live Births  1 1   0 1    # Outcome Date GA Lbr Len/2nd Weight Sex Delivery Anes PTL Lv  6 Current           5 Term 01/23/17 [redacted]w[redacted]d  7 lb 6.9 oz (3.37 kg) M CS-LTranv EPI N LIV     Complications: Failure to Progress in First Stage  4 Preterm 09/13/15 [redacted]w[redacted]d   F Vag-Spont  N FD     Birth Comments: unclear etiology, small 1cm placental bleed on pathology     Complications: IUFD (intrauterine fetal death), Antepartum placental abruption  3 SAB 02/24/15 [redacted]w[redacted]d         2 IAB 08/25/14  1 Preterm 01/26/14 [redacted]w[redacted]d   F   N FD     Birth Comments: fetus 20wk size, cytotec IOL, true knot     Complications: IUFD (intrauterine fetal death), True knot in cord   Physical Assessment:   Vitals:   07/01/22 1023  BP: 114/75  Pulse: 81  Weight: 232 lb (105.2 kg)  Body mass index is 42.43 kg/m.       Physical Examination:  General appearance - well appearing, and in no distress  Mental status - alert, oriented to person, place, and time  Psych:  She has a normal mood and affect  Skin - warm and dry, normal color, no suspicious lesions noted  Chest - effort normal, all lung fields clear to auscultation bilaterally  Heart - normal rate and regular rhythm  Abdomen - soft, nontender  Extremities:  No swelling or varicosities noted  Thin prep pap is not done   Chaperone: N/A    TODAY'S FHR + informal u/s, active fetus  Results for orders placed or performed in visit on 07/01/22 (from the past 24 hour(s))  POC Urinalysis Dipstick OB   Collection Time:  07/01/22 11:00 AM  Result Value Ref Range   Color, UA     Clarity, UA     Glucose, UA Negative Negative   Bilirubin, UA     Ketones, UA neg    Spec Grav, UA     Blood, UA neg    pH, UA     POC,PROTEIN,UA Negative Negative, Trace, Small (1+), Moderate (2+), Large (3+), 4+   Urobilinogen, UA     Nitrite, UA neg    Leukocytes, UA Negative Negative   Appearance     Odor      Assessment & Plan:  1) High-Risk Pregnancy [id number] at [redacted]w[redacted]d with an Estimated Date of Delivery: 01/10/23   2) Initial OB visit  3) H/O IUFD x 2> 24 (20wk size w/ true knot) & 28wks (1cm placental bleed on path, ?abruption), neg anticardiolipin work up, start ASA 81mg   4) Prev c/s> for failed IOL, gave TOLAC consent to review  5) Anxiety> mainly r/t previous pregnancy hx, doing well right now, declines IBH referral  Meds:  Meds ordered this encounter  Medications   aspirin EC 81 MG tablet    Sig: Take 1 tablet (81 mg total) by mouth daily. Swallow whole.    Dispense:  90 tablet    Refill:  3    Order Specific Question:   Supervising Provider    Answer:   H [[id number]]    Initial labs obtained Continue prenatal vitamins Reviewed n/v relief measures and warning s/s to report Reviewed recommended weight gain based on pre-gravid BMI Encouraged well-balanced diet Genetic & carrier screening discussed: requests Panorama, NT/IT, and Horizon  Ultrasound discussed; fetal survey: requested CCNC completed> form faxed if has or is planning to apply for medicaid The nature of [place] [facility] with multiple MDs and other Advanced Practice Providers was explained to patient; also emphasized that fellows, residents, and students are part of our team. Does have home bp cuff. Office bp cuff given: no. Rx sent: n/a. Check bp weekly, let [employer] know if consistently >140/90.   Follow-up: Return in about 4 weeks (around 07/29/2022) for HROB, MD or CNM, 2nd IT, in person; then 7wks from now for  HROB w/ anatomy u/s, md/cnm.   Orders Placed This Encounter  Procedures   Urine Culture   GC/Chlamydia Probe  Amp   Panorama Prenatal Test Full Panel   HORIZON CUSTOM   POC Urinalysis Dipstick OB    Cheral Marker CNM, [facility] 07/01/2022 12:28 PM

## 2022-07-03 LAB — URINE CULTURE

## 2022-07-03 LAB — CBC/D/PLT+RPR+RH+ABO+RUBIGG...
Antibody Screen: NEGATIVE
Basophils Absolute: 0 10*3/uL (ref 0.0–0.2)
Basos: 0 %
EOS (ABSOLUTE): 0.1 10*3/uL (ref 0.0–0.4)
Eos: 1 %
HCV Ab: NONREACTIVE
HIV Screen 4th Generation wRfx: NONREACTIVE
Hematocrit: 38.8 % (ref 34.0–46.6)
Hemoglobin: 13.2 g/dL (ref 11.1–15.9)
Hepatitis B Surface Ag: NEGATIVE
Immature Grans (Abs): 0 10*3/uL (ref 0.0–0.1)
Immature Granulocytes: 0 %
Lymphocytes Absolute: 2.4 10*3/uL (ref 0.7–3.1)
Lymphs: 23 %
MCH: 29.9 pg (ref 26.6–33.0)
MCHC: 34 g/dL (ref 31.5–35.7)
MCV: 88 fL (ref 79–97)
Monocytes Absolute: 0.6 10*3/uL (ref 0.1–0.9)
Monocytes: 5 %
Neutrophils Absolute: 7.5 10*3/uL — ABNORMAL HIGH (ref 1.4–7.0)
Neutrophils: 71 %
Platelets: 217 10*3/uL (ref 150–450)
RBC: 4.41 x10E6/uL (ref 3.77–5.28)
RDW: 12.7 % (ref 11.7–15.4)
RPR Ser Ql: NONREACTIVE
Rh Factor: POSITIVE
Rubella Antibodies, IGG: 1.61 index (ref >0.99)
WBC: 10.6 10*3/uL (ref 3.4–10.8)

## 2022-07-03 LAB — INTEGRATED 1
Crown Rump Length: 56.3 mm
Gest. Age on Collection Date: 12.4 weeks
Maternal Age at EDD: 32.6 yr
Nuchal Translucency (NT): 1.5 mm
Number of Fetuses: 1
PAPP-A Value: 450.5 ng/mL
Weight: 233 [lb_av]

## 2022-07-03 LAB — HCV INTERPRETATION

## 2022-07-03 LAB — GC/CHLAMYDIA PROBE AMP
Chlamydia trachomatis, NAA: NEGATIVE
Neisseria Gonorrhoeae by PCR: NEGATIVE

## 2022-07-03 LAB — HEMOGLOBIN A1C
Est. average glucose Bld gHb Est-mCnc: 105 mg/dL
Hgb A1c MFr Bld: 5.3 % (ref 4.8–5.6)

## 2022-07-06 LAB — PANORAMA PRENATAL TEST FULL PANEL:PANORAMA TEST PLUS 5 ADDITIONAL MICRODELETIONS: FETAL FRACTION: 6.4

## 2022-07-18 LAB — HORIZON CUSTOM: REPORT SUMMARY: POSITIVE — AB

## 2022-07-19 ENCOUNTER — Encounter: Payer: Self-pay | Admitting: Women's Health

## 2022-07-22 ENCOUNTER — Encounter: Payer: Self-pay | Admitting: Women's Health

## 2022-07-22 DIAGNOSIS — Z148 Genetic carrier of other disease: Secondary | ICD-10-CM | POA: Insufficient documentation

## 2022-07-30 ENCOUNTER — Encounter: Payer: Self-pay | Admitting: Obstetrics and Gynecology

## 2022-07-30 ENCOUNTER — Ambulatory Visit (INDEPENDENT_AMBULATORY_CARE_PROVIDER_SITE_OTHER): Payer: Medicaid Other | Admitting: Obstetrics and Gynecology

## 2022-07-30 VITALS — BP 128/74 | HR 84 | Wt 228.0 lb

## 2022-07-30 DIAGNOSIS — Z8759 Personal history of other complications of pregnancy, childbirth and the puerperium: Secondary | ICD-10-CM

## 2022-07-30 DIAGNOSIS — O0990 Supervision of high risk pregnancy, unspecified, unspecified trimester: Secondary | ICD-10-CM

## 2022-07-30 DIAGNOSIS — Z148 Genetic carrier of other disease: Secondary | ICD-10-CM

## 2022-07-30 DIAGNOSIS — Z98891 History of uterine scar from previous surgery: Secondary | ICD-10-CM

## 2022-07-30 DIAGNOSIS — Z3A16 16 weeks gestation of pregnancy: Secondary | ICD-10-CM

## 2022-07-30 NOTE — Patient Instructions (Signed)

## 2022-07-30 NOTE — Progress Notes (Signed)
Subjective:  Jade Taylor is a 31 y.o. [id number] at [redacted]w[redacted]d being seen today for ongoing prenatal care.  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  Patient reports no complaints.  Contractions: Not present. Vag. Bleeding: None.   . Denies leaking of fluid.   The following portions of the patient's history were reviewed and updated as appropriate: allergies, current medications, past family history, past medical history, past social history, past surgical history and problem list. Problem list updated.  Objective:   Vitals:   07/30/22 1410  BP: 128/74  Pulse: 84  Weight: 228 lb (103.4 kg)    Fetal Status:           General:  Alert, oriented and cooperative. Patient is in no acute distress.  Skin: Skin is warm and dry. No rash noted.   Cardiovascular: Normal heart rate noted  Respiratory: Normal respiratory effort, no problems with respiration noted  Abdomen: Soft, gravid, appropriate for gestational age. Pain/Pressure: Absent     Pelvic:  Cervical exam deferred        Extremities: Normal range of motion.  Edema: None  Mental Status: Normal mood and affect. Normal behavior. Normal judgment and thought content.   Urinalysis:      Assessment and Plan:  Pregnancy: [id number] at [redacted]w[redacted]d  1. Supervision of high risk pregnancy, antepartum Stable Anatomy scan scheduled - INTEGRATED 2  2. History of fetal demise x 2, not currently pregnant Serial growth scans and antenatal testing as per protocol  3. History of cesarean delivery Stable  4.  Carrier of spinal muscular atrophy Stable  5. [redacted] weeks gestation of pregnancy  - INTEGRATED 2  Preterm labor symptoms and general obstetric precautions including but not limited to vaginal bleeding, contractions, leaking of fluid and fetal movement were reviewed in detail with the patient. Please refer to After Visit Summary for other counseling recommendations.  Return in about 4 weeks (around 08/27/2022) for OB visit, face to face, any provider, anatomy scan with next OB appt.   Hermina Staggers, MD

## 2022-08-02 LAB — INTEGRATED 2
AFP MoM: 1.96
Alpha-Fetoprotein: 48.1 ng/mL
Crown Rump Length: 56.3 mm
DIA MoM: 1.07
DIA Value: 127.1 pg/mL
Estriol, Unconjugated: 1.08 ng/mL
Gest. Age on Collection Date: 12.4 weeks
Gestational Age: 16.6 weeks
Maternal Age at EDD: 32.6 yr
Nuchal Translucency (NT): 1.5 mm
Nuchal Translucency MoM: 1.19
Number of Fetuses: 1
PAPP-A MoM: 0.78
PAPP-A Value: 450.5 ng/mL
Test Results:: NEGATIVE
Weight: 233 [lb_av]
Weight: 233 [lb_av]
hCG MoM: 1.38
hCG Value: 33 IU/mL
uE3 MoM: 1.19

## 2022-08-16 ENCOUNTER — Other Ambulatory Visit: Payer: Self-pay | Admitting: Obstetrics & Gynecology

## 2022-08-16 DIAGNOSIS — Z363 Encounter for antenatal screening for malformations: Secondary | ICD-10-CM

## 2022-08-19 ENCOUNTER — Encounter: Payer: Self-pay | Admitting: Obstetrics & Gynecology

## 2022-08-19 ENCOUNTER — Ambulatory Visit (INDEPENDENT_AMBULATORY_CARE_PROVIDER_SITE_OTHER): Payer: Medicaid Other | Admitting: Obstetrics & Gynecology

## 2022-08-19 ENCOUNTER — Ambulatory Visit (INDEPENDENT_AMBULATORY_CARE_PROVIDER_SITE_OTHER): Payer: Medicaid Other

## 2022-08-19 VITALS — BP 118/78 | HR 84 | Wt 233.0 lb

## 2022-08-19 DIAGNOSIS — O0990 Supervision of high risk pregnancy, unspecified, unspecified trimester: Secondary | ICD-10-CM

## 2022-08-19 DIAGNOSIS — Z8759 Personal history of other complications of pregnancy, childbirth and the puerperium: Secondary | ICD-10-CM

## 2022-08-19 DIAGNOSIS — Z363 Encounter for antenatal screening for malformations: Secondary | ICD-10-CM

## 2022-08-19 DIAGNOSIS — Z3A19 19 weeks gestation of pregnancy: Secondary | ICD-10-CM

## 2022-08-19 DIAGNOSIS — Z98891 History of uterine scar from previous surgery: Secondary | ICD-10-CM

## 2022-08-19 MED ORDER — WESTAB PLUS 27-1 MG PO TABS: 1.0000 | ORAL_TABLET | Freq: Every day | ORAL | 11 refills | Status: DC

## 2022-08-19 NOTE — Progress Notes (Signed)
[place] 92+9 wks,cephalic,anterior placenta gr 0,normal ovaries,FHR 138 bpm,cx 3.2 cm,SVP of fluid 4.8 cm,EFW 279 g 32%,anatomy complete,no obvious abnormalities

## 2022-08-19 NOTE — Progress Notes (Signed)
HIGH-RISK PREGNANCY VISIT Patient name: Jade Taylor MRN [id number]  Date of birth: September 19, 1991 Chief Complaint:   Routine Prenatal Visit  History of Present Illness:   Jade Taylor is a 31 y.o. [id number] female at [redacted]w[redacted]d with an Estimated Date of Delivery: 01/10/23 being seen today for ongoing management of a high-risk pregnancy complicated by 24 and 28 fetal losses.    Today she reports no complaints. Contractions: Not present. Vag. Bleeding: None.  Movement: Present. denies leaking of fluid.      07/01/2022   10:40 AM 02/20/2022    2:32 PM 07/03/2020    2:52 PM 07/10/2016    2:59 PM  Depression screen PHQ 2/9  Decreased Interest 0 0 0 0  Down, Depressed, Hopeless 0 0 0 0  PHQ - 2 Score 0 0 0 0  Altered sleeping 0 2 1 1   Tired, decreased energy 1 1 1  0  Change in appetite 0 0 0 0  Feeling bad or failure about yourself  0 0 0 0  Trouble concentrating 0 2 1 0  Moving slowly or fidgety/restless 0 1 0 0  Suicidal thoughts 0 0 0 0  PHQ-9 Score 1 6 3 1   Difficult doing work/chores   Not difficult at all         07/01/2022   10:40 AM 02/20/2022    2:34 PM 07/03/2020    2:53 PM  GAD 7 : Generalized Anxiety Score  Nervous, Anxious, on Edge 0 1 1  Control/stop worrying 0 1 0  Worry too much - different things 0 1 0  Trouble relaxing 0 1 0  Restless 0 1 1  Easily annoyed or irritable 0 0 1  Afraid - awful might happen 0 1 1  Total GAD 7 Score 0 6 4  Anxiety Difficulty   Not difficult at all     Review of Systems:   Pertinent items are noted in HPI Denies abnormal vaginal discharge w/ itching/odor/irritation, headaches, visual changes, shortness of breath, chest pain, abdominal pain, severe nausea/vomiting, or problems with urination or bowel movements unless otherwise stated above. Pertinent History Reviewed:  Reviewed past medical,surgical, social, obstetrical and family history.  Reviewed problem list, medications and allergies. Physical Assessment:   Vitals:    08/19/22 1511  BP: 118/78  Pulse: 84  Weight: 233 lb (105.7 kg)  Body mass index is 42.62 kg/m.           Physical Examination:   General appearance: alert, well appearing, and in no distress  Mental status: alert, oriented to person, place, and time  Skin: warm & dry   Extremities: Edema: None    Cardiovascular: normal heart rate noted  Respiratory: normal respiratory effort, no distress  Abdomen: gravid, soft, non-tender  Pelvic: Cervical exam deferred         Fetal Status:     Movement: Present    Fetal Surveillance Testing today: sonogram today is normal   Chaperone: N/A    No results found for this or any previous visit (from the past 24 hour(s)).  Assessment & Plan:  High-risk pregnancy: [id number] at [redacted]w[redacted]d with an Estimated Date of Delivery: 01/10/23      ICD-10-CM   1. Supervision of high risk pregnancy, antepartum  O09.90     2. History of fetal demise x 2, not currently pregnant  Z87.59     3. History of cesarean delivery  Z98.891         Meds:  Meds  ordered this encounter  Medications   Prenatal Vit-Fe Fumarate-FA (WESTAB PLUS) 27-1 MG TABS    Sig: Take 1 tablet by mouth daily.    Dispense:  100 tablet    Refill:  11    Orders: No orders of the defined types were placed in this encounter.    Labs/procedures today: U/S  Treatment Plan:  routine care for now, growth scans 28, 32 36 weeks    Follow-up: Return in about 4 weeks (around 09/16/2022) for [place].   No future appointments.  No orders of the defined types were placed in this encounter.  Florian Buff  Attending Physician for [facility] 08/19/2022 3:26 PM

## 2022-09-02 ENCOUNTER — Encounter: Payer: Self-pay | Admitting: Obstetrics and Gynecology

## 2022-09-02 ENCOUNTER — Ambulatory Visit (INDEPENDENT_AMBULATORY_CARE_PROVIDER_SITE_OTHER): Payer: Medicaid Other | Admitting: Obstetrics and Gynecology

## 2022-09-02 VITALS — BP 133/74 | HR 91 | Wt 237.5 lb

## 2022-09-02 DIAGNOSIS — Z3A21 21 weeks gestation of pregnancy: Secondary | ICD-10-CM

## 2022-09-02 DIAGNOSIS — O0990 Supervision of high risk pregnancy, unspecified, unspecified trimester: Secondary | ICD-10-CM

## 2022-09-02 DIAGNOSIS — B9789 Other viral agents as the cause of diseases classified elsewhere: Secondary | ICD-10-CM

## 2022-09-02 DIAGNOSIS — J988 Other specified respiratory disorders: Secondary | ICD-10-CM

## 2022-09-02 MED ORDER — ALBUTEROL SULFATE HFA 108 (90 BASE) MCG/ACT IN AERS: 2.0000 | INHALATION_SPRAY | Freq: Four times a day (QID) | RESPIRATORY_TRACT | 2 refills | Status: DC | PRN

## 2022-09-02 NOTE — Progress Notes (Signed)
   PRENATAL VISIT NOTE  Subjective:  Jade Taylor is a 31 y.o. [id number] at [redacted]w[redacted]d being seen today for ongoing prenatal care.  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  Patient reports  cold symptoms . Only symptom is cough. Somewhat productive. Denies fever.  Contractions: Not present. Vag. Bleeding: None.  Movement: Present. Denies leaking of fluid.   The following portions of the patient's history were reviewed and updated as appropriate: allergies, current medications, past family history, past medical history, past social history, past surgical history and problem list.   Objective:   Vitals:   09/02/22 1606  BP: 133/74  Pulse: 91  Weight: 237 lb 8 oz (107.7 kg)    Fetal Status: Fetal Heart Rate (bpm): 145   Movement: Present     General:  Alert, oriented and cooperative. Patient is in no acute distress.  Skin: Skin is warm and dry. No rash noted.   Cardiovascular: Normal heart rate noted  Respiratory: Normal respiratory effort, no problems with respiration noted  Abdomen: Soft, gravid, appropriate for gestational age.  Pain/Pressure: Absent     Pelvic: Cervical exam deferred        Extremities: Normal range of motion.  Edema: None  Mental Status: Normal mood and affect. Normal behavior. Normal judgment and thought content.   Assessment and Plan:  Pregnancy: [id number] at [redacted]w[redacted]d 1. Supervision of high risk pregnancy, antepartum Normal genetics, normal anatomy US. FHT normal today, pt reassured.   2. Viral respiratory  infection Has remote hx of asthma. No recent albuterol use and doesn't have inhaler. Hasn't taken anything due to fear with prior losses. Will do inhaler and review honey, decaf tea with honey, or honey cough suppressant drops   Preterm labor symptoms and general obstetric precautions including but not limited to vaginal bleeding, contractions, leaking of fluid and fetal movement were reviewed in detail with the patient. Please refer to After Visit Summary for other counseling recommendations.   Return As scheduled for next appt.  Future Appointments  Date Time Provider [place]  09/17/2022  8:50 AM Eure, Mertie Clause, MD [facility]    Radene Gunning, MD

## 2022-09-16 ENCOUNTER — Encounter: Payer: Medicaid Other | Admitting: Obstetrics & Gynecology

## 2022-09-17 ENCOUNTER — Ambulatory Visit: Payer: Medicaid Other | Admitting: Obstetrics & Gynecology

## 2022-09-17 ENCOUNTER — Encounter: Payer: Self-pay | Admitting: Women's Health

## 2022-09-17 VITALS — BP 121/80 | HR 92 | Wt 244.0 lb

## 2022-09-17 DIAGNOSIS — Z98891 History of uterine scar from previous surgery: Secondary | ICD-10-CM

## 2022-09-17 DIAGNOSIS — Z8759 Personal history of other complications of pregnancy, childbirth and the puerperium: Secondary | ICD-10-CM

## 2022-09-17 DIAGNOSIS — Z23 Encounter for immunization: Secondary | ICD-10-CM

## 2022-09-17 DIAGNOSIS — O0990 Supervision of high risk pregnancy, unspecified, unspecified trimester: Secondary | ICD-10-CM

## 2022-09-17 NOTE — Progress Notes (Unsigned)
HIGH-RISK PREGNANCY VISIT Patient name: Jade Taylor MRN [id number]  Date of birth: 01/04/1991 Chief Complaint:   Routine Prenatal Visit  History of Present Illness:   DEANA KROCK is a 31 y.o. [id number] female at [redacted]w[redacted]d with an Estimated Date of Delivery: 01/10/23 being seen today for ongoing management of a high-risk pregnancy complicated by Hx of IUFD x 2, hx of C section x 2.    Today she reports no complaints. Contractions: Not present. Vag. Bleeding: None.  Movement: Present. denies leaking of fluid.      07/01/2022   10:40 AM 02/20/2022    2:32 PM 07/03/2020    2:52 PM 07/10/2016    2:59 PM  Depression screen PHQ 2/9  Decreased Interest 0 0 0 0  Down, Depressed, Hopeless 0 0 0 0  PHQ - 2 Score 0 0 0 0  Altered sleeping 0 2 1 1   Tired, decreased energy 1 1 1  0  Change in appetite 0 0 0 0  Feeling bad or failure about yourself  0 0 0 0  Trouble concentrating 0 2 1 0  Moving slowly or fidgety/restless 0 1 0 0  Suicidal thoughts 0 0 0 0  PHQ-9 Score 1 6 3 1   Difficult doing work/chores   Not difficult at all         07/01/2022   10:40 AM 02/20/2022    2:34 PM 07/03/2020    2:53 PM  GAD 7 : Generalized Anxiety Score  Nervous, Anxious, on Edge 0 1 1  Control/stop worrying 0 1 0  Worry too much - different things 0 1 0  Trouble relaxing 0 1 0  Restless 0 1 1  Easily annoyed or irritable 0 0 1  Afraid - awful might happen 0 1 1  Total GAD 7 Score 0 6 4  Anxiety Difficulty   Not difficult at all     Review of Systems:   Pertinent items are noted in HPI Denies abnormal vaginal discharge w/ itching/odor/irritation, headaches, visual changes, shortness of breath, chest pain, abdominal pain, severe nausea/vomiting, or problems with urination or bowel movements unless otherwise stated above. Pertinent History Reviewed:  Reviewed past medical,surgical, social, obstetrical and family history.  Reviewed problem list, medications and allergies. Physical Assessment:    Vitals:   09/17/22 0850  BP: 121/80  Pulse: 92  Weight: 244 lb (110.7 kg)  Body mass index is 44.63 kg/m.           Physical Examination:   General appearance: alert, well appearing, and in no distress  Mental status: alert, oriented to person, place, and time  Skin: warm & dry   Extremities: Edema: None    Cardiovascular: normal heart rate noted  Respiratory: normal respiratory effort, no distress  Abdomen: gravid, soft, non-tender  Pelvic: Cervical exam deferred         Fetal Status: Fetal Heart Rate (bpm): 150   Movement: Present    Fetal Surveillance Testing today: FHR 150   Chaperone: N/A    No results found for this or any previous visit (from the past 24 hour(s)).  Assessment & Plan:  High-risk pregnancy: [id number] at [redacted]w[redacted]d with an Estimated Date of Delivery: 01/10/23      ICD-10-CM   1. Supervision of high risk pregnancy, antepartum  O09.90     2. History of cesarean delivery x 2  Z98.891    for repeat    3. History of IUFD x 2, 24 and 28 weeks  Z87.59     4. Need for immunization against influenza  Z23 Flu Vaccine QUAD 77mo+IM (Fluarix, Fluzone & Alfiuria Quad PF)       Meds: No orders of the defined types were placed in this encounter.   Orders:  Orders Placed This Encounter  Procedures   Flu Vaccine QUAD 61mo+IM (Fluarix, Fluzone & Alfiuria Quad PF)     Labs/procedures today: none  Treatment Plan:  sonogram next visit  Reviewed: Preterm labor symptoms and general obstetric precautions including but not limited to vaginal bleeding, contractions, leaking of fluid and fetal movement were reviewed in detail with the patient.  All questions were answered. Does have home bp cuff. Office bp cuff given: yes. Check bp weekly, let us know if consistently >140 and/or >90.  Follow-up: Return in about 4 weeks (around 10/15/2022) for sonogram next visit + PN2.   No future appointments.  Orders Placed This Encounter  Procedures   Flu Vaccine QUAD 39mo+IM  (Fluarix, Fluzone & Alfiuria Quad PF)   Florian Buff  Attending Physician for the [facility] 09/17/2022 9:25 AM

## 2022-10-14 ENCOUNTER — Other Ambulatory Visit: Payer: Self-pay | Admitting: Obstetrics & Gynecology

## 2022-10-14 DIAGNOSIS — Z8759 Personal history of other complications of pregnancy, childbirth and the puerperium: Secondary | ICD-10-CM

## 2022-10-15 ENCOUNTER — Other Ambulatory Visit: Payer: Medicaid Other

## 2022-10-15 ENCOUNTER — Encounter: Payer: Self-pay | Admitting: Obstetrics & Gynecology

## 2022-10-15 ENCOUNTER — Ambulatory Visit (INDEPENDENT_AMBULATORY_CARE_PROVIDER_SITE_OTHER): Payer: Medicaid Other | Admitting: Obstetrics & Gynecology

## 2022-10-15 ENCOUNTER — Ambulatory Visit (INDEPENDENT_AMBULATORY_CARE_PROVIDER_SITE_OTHER): Payer: Medicaid Other

## 2022-10-15 VITALS — BP 126/72 | HR 92 | Wt 252.6 lb

## 2022-10-15 DIAGNOSIS — O0990 Supervision of high risk pregnancy, unspecified, unspecified trimester: Secondary | ICD-10-CM

## 2022-10-15 DIAGNOSIS — Z131 Encounter for screening for diabetes mellitus: Secondary | ICD-10-CM

## 2022-10-15 DIAGNOSIS — Z98891 History of uterine scar from previous surgery: Secondary | ICD-10-CM

## 2022-10-15 DIAGNOSIS — Z3A27 27 weeks gestation of pregnancy: Secondary | ICD-10-CM

## 2022-10-15 DIAGNOSIS — Z8759 Personal history of other complications of pregnancy, childbirth and the puerperium: Secondary | ICD-10-CM | POA: Diagnosis not present

## 2022-10-15 NOTE — Progress Notes (Signed)
HIGH-RISK PREGNANCY VISIT Patient name: Jade Taylor MRN [id number]  Date of birth: 1991/11/12 Chief Complaint:   Gynecologic Exam  History of Present Illness:   Jade Taylor is a 31 y.o. [id number] female at [redacted]w[redacted]d with an Estimated Date of Delivery: 01/10/23 being seen today for ongoing management of a high-risk pregnancy.   -prior C-section -prior IUFD x 2      07/01/2022   10:40 AM 02/20/2022    2:32 PM 07/03/2020    2:52 PM 07/10/2016    2:59 PM  Depression screen PHQ 2/9  Decreased Interest 0 0 0 0  Down, Depressed, Hopeless 0 0 0 0  PHQ - 2 Score 0 0 0 0  Altered sleeping 0 2 1 1   Tired, decreased energy 1 1 1  0  Change in appetite 0 0 0 0  Feeling bad or failure about yourself  0 0 0 0  Trouble concentrating 0 2 1 0  Moving slowly or fidgety/restless 0 1 0 0  Suicidal thoughts 0 0 0 0  PHQ-9 Score 1 6 3 1   Difficult doing work/chores   Not difficult at all     Today she reports no complaints. Contractions: Not present. Vag. Bleeding: None.  Movement: Present. denies leaking of fluid. Review of Systems:   Pertinent items are noted in HPI Denies abnormal vaginal discharge w/ itching/odor/irritation, headaches, visual changes, shortness of breath, chest pain, abdominal pain, severe nausea/vomiting, or problems with urination or bowel movements unless otherwise stated above. Pertinent History Reviewed:  Reviewed past medical,surgical, social, obstetrical and family history.  Reviewed problem list, medications and allergies.  Physical Assessment:   Vitals:   10/15/22 1201  BP: 126/72  Pulse: 92  Weight: 252 lb 9.6 oz (114.6 kg)  Body mass index is 46.2 kg/m.        Physical Examination:   General appearance: Well appearing, and in no distress  Mental status: Alert, oriented to person, place, and time  Skin: Warm & dry  Respiratory: Normal respiratory effort, no distress  Abdomen: Soft, gravid, nontender  Pelvic: Cervical exam deferred          Extremities: Edema: None  Psych:  mood and affect appropriate  Fetal Status:     Movement: Present  cephalic,anterior placenta gr 1,AFI 11 cm,FHR 142 bpm,EFW 1131 g 47%,limited view    Chaperone: n/a    No results found for this or any previous visit (from the past 24 hour(s)).   Assessment & Plan:  1) High-risk pregnancy at [redacted]w[redacted]d with an Estimated Date of Delivery: 01/10/23   -h/o IUFD x 2 -plan for antepartum testing @ 32wks -continue growth q 4wks -likely plan for IOL 37-39wks pending maternal anxiety  -plan for repeat C-section   Meds: No orders of the defined types were placed in this encounter.  Labs/procedures today: growth scan  Plan:  Continue routine obstetrical care  Next visit: prefers in person    Reviewed: Preterm labor symptoms and general obstetric precautions including but not limited to vaginal bleeding, contractions, leaking of fluid and fetal movement were reviewed in detail with the patient.  All questions were answered. Pt has home bp cuff. Check bp weekly, let [id number] know if >140/90.   Follow-up: Return in about 4 weeks (around 11/12/2022) for LROB visit and growth (every 4 wks) AND in 4wks (32wk- BPP weekly or NST twice weekly).  01/12/23, DO Attending Obstetrician & Gynecologist, [facility] for 11/14/2022, [facility]

## 2022-10-15 NOTE — Progress Notes (Signed)
[place] 27+4 wks,cephalic,anterior placenta gr 1,AFI 11 cm,FHR 142 bpm,EFW 1131 g 47%,limited view

## 2022-10-16 LAB — GLUCOSE TOLERANCE, 2 HOURS W/ 1HR
Glucose, 1 hour: 139 mg/dL (ref 70–179)
Glucose, 2 hour: 98 mg/dL (ref 70–152)
Glucose, Fasting: 86 mg/dL (ref 70–91)

## 2022-10-16 LAB — CBC
Hematocrit: 34.2 % (ref 34.0–46.6)
Hemoglobin: 11.6 g/dL (ref 11.1–15.9)
MCH: 29.8 pg (ref 26.6–33.0)
MCHC: 33.9 g/dL (ref 31.5–35.7)
MCV: 88 fL (ref 79–97)
Platelets: 194 10*3/uL (ref 150–450)
RBC: 3.89 x10E6/uL (ref 3.77–5.28)
RDW: 13 % (ref 11.7–15.4)
WBC: 13.8 10*3/uL — ABNORMAL HIGH (ref 3.4–10.8)

## 2022-10-16 LAB — RPR: RPR Ser Ql: NONREACTIVE

## 2022-10-16 LAB — ANTIBODY SCREEN: Antibody Screen: NEGATIVE

## 2022-10-16 LAB — HIV ANTIBODY (ROUTINE TESTING W REFLEX): HIV Screen 4th Generation wRfx: NONREACTIVE

## 2022-10-31 DIAGNOSIS — Z0289 Encounter for other administrative examinations: Secondary | ICD-10-CM

## 2022-11-04 ENCOUNTER — Encounter: Payer: Self-pay | Admitting: *Deleted

## 2022-11-14 ENCOUNTER — Other Ambulatory Visit: Payer: Self-pay | Admitting: Obstetrics & Gynecology

## 2022-11-14 DIAGNOSIS — Z8759 Personal history of other complications of pregnancy, childbirth and the puerperium: Secondary | ICD-10-CM

## 2022-11-15 ENCOUNTER — Encounter: Payer: Self-pay | Admitting: Obstetrics & Gynecology

## 2022-11-15 ENCOUNTER — Ambulatory Visit (INDEPENDENT_AMBULATORY_CARE_PROVIDER_SITE_OTHER): Payer: Medicaid Other

## 2022-11-15 ENCOUNTER — Ambulatory Visit (INDEPENDENT_AMBULATORY_CARE_PROVIDER_SITE_OTHER): Payer: Medicaid Other | Admitting: Obstetrics & Gynecology

## 2022-11-15 VITALS — BP 149/86 | HR 103 | Wt 259.2 lb

## 2022-11-15 DIAGNOSIS — O0990 Supervision of high risk pregnancy, unspecified, unspecified trimester: Secondary | ICD-10-CM

## 2022-11-15 DIAGNOSIS — Z23 Encounter for immunization: Secondary | ICD-10-CM | POA: Diagnosis not present

## 2022-11-15 DIAGNOSIS — Z8759 Personal history of other complications of pregnancy, childbirth and the puerperium: Secondary | ICD-10-CM

## 2022-11-15 DIAGNOSIS — F418 Other specified anxiety disorders: Secondary | ICD-10-CM

## 2022-11-15 DIAGNOSIS — R030 Elevated blood-pressure reading, without diagnosis of hypertension: Secondary | ICD-10-CM

## 2022-11-15 DIAGNOSIS — Z3A32 32 weeks gestation of pregnancy: Secondary | ICD-10-CM | POA: Diagnosis not present

## 2022-11-15 DIAGNOSIS — Z98891 History of uterine scar from previous surgery: Secondary | ICD-10-CM

## 2022-11-15 LAB — POCT URINALYSIS DIPSTICK OB
Blood, UA: NEGATIVE
Glucose, UA: NEGATIVE
Ketones, UA: NEGATIVE
Nitrite, UA: NEGATIVE
POC,PROTEIN,UA: NEGATIVE

## 2022-11-15 MED ORDER — HYDROXYZINE HCL 25 MG PO TABS: 25.0000 mg | ORAL_TABLET | Freq: Three times a day (TID) | ORAL | 0 refills | Status: DC | PRN

## 2022-11-15 NOTE — Progress Notes (Signed)
[place] 32 wks,cephalic,FHR 137 bpm,anterior placenta gr 3,BPP 8/8,EFW 1744 g 21%,AFI 13 cm,limited view

## 2022-11-15 NOTE — Progress Notes (Signed)
HIGH-RISK PREGNANCY VISIT Patient name: Jade Taylor MRN [id number]  Date of birth: 1990/12/25 Chief Complaint:   High Risk Gestation ([place] today)  History of Present Illness:   Jade Taylor is a 31 y.o. [id number] female at [redacted]w[redacted]d with an Estimated Date of Delivery: 01/10/23 being seen today for ongoing management of a high-risk pregnancy complicated by:  -IUFD x2 -prior C-section.    Today she reports feeling very anxious about the pregnancy- nervous as to whether the baby is doing ok.  Notes good fetal movement, but notes feeling worried all the time.  Pt is tearful in discussing.  Contractions: Not present. Vag. Bleeding: None.  Movement: Present. denies leaking of fluid.      07/01/2022   10:40 AM 02/20/2022    2:32 PM 07/03/2020    2:52 PM 07/10/2016    2:59 PM  Depression screen PHQ 2/9  Decreased Interest 0 0 0 0  Down, Depressed, Hopeless 0 0 0 0  PHQ - 2 Score 0 0 0 0  Altered sleeping 0 2 1 1   Tired, decreased energy 1 1 1  0  Change in appetite 0 0 0 0  Feeling bad or failure about yourself  0 0 0 0  Trouble concentrating 0 2 1 0  Moving slowly or fidgety/restless 0 1 0 0  Suicidal thoughts 0 0 0 0  PHQ-9 Score 1 6 3 1   Difficult doing work/chores   Not difficult at all      Current Outpatient Medications  Medication Instructions   albuterol (VENTOLIN HFA) 108 (90 Base) MCG/ACT inhaler 2 puffs, Inhalation, Every 6 hours PRN   aspirin EC 81 mg, Oral, Daily, Swallow whole.   hydrOXYzine (ATARAX) 25 mg, Oral, 3 times daily PRN   Prenatal Vit-Fe Fumarate-FA (WESTAB PLUS) 27-1 MG TABS 1 tablet, Oral, Daily     Review of Systems:   Pertinent items are noted in HPI Denies abnormal vaginal discharge w/ itching/odor/irritation, headaches, visual changes, shortness of breath, chest pain, abdominal pain, severe nausea/vomiting, or problems with urination or bowel movements unless otherwise stated above. Pertinent History Reviewed:  Reviewed past medical,surgical,  social, obstetrical and family history.  Reviewed problem list, medications and allergies. Physical Assessment:   Vitals:   11/15/22 1202 11/15/22 1206  BP: (!) 143/86 (!) 149/86  Pulse: 95 (!) 103  Weight: 259 lb 3.2 oz (117.6 kg)   Body mass index is 47.41 kg/m.           Physical Examination:   General appearance: crying  Mental status: anxious  Skin: warm & dry   Extremities:      Cardiovascular: normal heart rate noted  Respiratory: normal respiratory effort, no distress  Abdomen: gravid, soft, non-tender  Pelvic: Cervical exam deferred         Fetal Status:     Movement: Present    Fetal Surveillance Testing today: BPP-cephalic,FHR 137 bpm,anterior placenta gr 3,BPP 8/8,EFW 1744 g 21%,AFI 13 cm,limited view     Chaperone: N/A    Results for orders placed or performed in visit on 11/15/22 (from the past 24 hour(s))  POC Urinalysis Dipstick OB   Collection Time: 11/15/22 12:15 PM  Result Value Ref Range   Color, UA     Clarity, UA     Glucose, UA Negative Negative   Bilirubin, UA     Ketones, UA neg    Spec Grav, UA     Blood, UA neg    pH, UA  POC,PROTEIN,UA Negative Negative, Trace, Small (1+), Moderate (2+), Large (3+), 4+   Urobilinogen, UA     Nitrite, UA neg    Leukocytes, UA Trace (A) Negative   Appearance     Odor       Assessment & Plan:  High-risk pregnancy: [id number] at [redacted]w[redacted]d with an Estimated Date of Delivery: 01/10/23   1) h/o IUFD x 2, anxiety about health -BPP 8/8, reassured pt of today's findings -reviewed plan for early IOL and will confirm with MFM IOL @ 37wk -continue twice weekly testing -vistaril sent in to take prn  2) Elevated BP -suspect this is due to anxiety, pt asymptomatic -pt to check BP at home, plan to recheck next week  3) h/o C-section -plan for repeat  Meds:  Meds ordered this encounter  Medications   hydrOXYzine (ATARAX) 25 MG tablet    Sig: Take 1 tablet (25 mg total) by mouth 3 (three) times daily as needed  for anxiety.    Dispense:  30 tablet    Refill:  0    Labs/procedures today: BPP, Tdap today  Treatment Plan:  as outlined above  Reviewed: Preterm labor symptoms and general obstetric precautions including but not limited to vaginal bleeding, contractions, leaking of fluid and fetal movement were reviewed in detail with the patient.  All questions were answered. Pt has home bp cuff. Check bp weekly, let us know if >140/90.   Follow-up: Return for add NST weekly (twice weekly visit).   Future Appointments  Date Time Provider [place]  11/20/2022  9:50 AM [facility] NURSE [facility]  11/22/2022 10:45 AM [facility] [place] [facility] None  11/22/2022 11:50 AM Florian Buff, MD [facility]  11/26/2022  8:30 AM [facility] NURSE [facility]  11/29/2022 10:00 AM [facility] [place] [facility] None  11/29/2022 11:10 AM Florian Buff, MD [facility]  12/03/2022  9:50 AM [facility] NURSE [facility]  12/06/2022 10:00 AM [facility] [place] [facility] None  12/06/2022 10:50 AM Janyth Pupa, DO [facility]  12/10/2022  9:50 AM [facility] NURSE [facility]  12/13/2022 10:00 AM [facility] [place] [facility] None  12/13/2022 10:50 AM Florian Buff, MD [facility]  12/17/2022 10:30 AM [facility] NURSE [facility]  12/20/2022  9:15 AM [facility] [place] [facility] None  12/20/2022 10:10 AM Florian Buff, MD [facility]  12/27/2022  8:30 AM [facility] [place] [facility] None    Orders Placed This Encounter  Procedures   Tdap vaccine greater than or equal to 7yo IM   POC Urinalysis Dipstick OB    Janyth Pupa, DO Attending [place], [place] for [employer], [place]

## 2022-11-17 ENCOUNTER — Other Ambulatory Visit: Payer: Self-pay | Admitting: Obstetrics and Gynecology

## 2022-11-17 DIAGNOSIS — B9789 Other viral agents as the cause of diseases classified elsewhere: Secondary | ICD-10-CM

## 2022-11-17 DIAGNOSIS — O0990 Supervision of high risk pregnancy, unspecified, unspecified trimester: Secondary | ICD-10-CM

## 2022-11-20 ENCOUNTER — Ambulatory Visit (INDEPENDENT_AMBULATORY_CARE_PROVIDER_SITE_OTHER): Payer: Medicaid Other | Admitting: *Deleted

## 2022-11-20 VITALS — BP 129/65 | HR 100 | Wt 263.0 lb

## 2022-11-20 DIAGNOSIS — Z3A32 32 weeks gestation of pregnancy: Secondary | ICD-10-CM

## 2022-11-20 DIAGNOSIS — Z8759 Personal history of other complications of pregnancy, childbirth and the puerperium: Secondary | ICD-10-CM | POA: Diagnosis not present

## 2022-11-20 DIAGNOSIS — O0990 Supervision of high risk pregnancy, unspecified, unspecified trimester: Secondary | ICD-10-CM | POA: Diagnosis not present

## 2022-11-20 NOTE — Progress Notes (Signed)
   NURSE VISIT- NST  SUBJECTIVE:  Jade Taylor is a 31 y.o. [phone] female at [redacted]w[redacted]d, here for a NST for pregnancy complicated by history of IUFD.  She reports active fetal movement, contractions: none, vaginal bleeding: none, membranes: intact.   OBJECTIVE:  LMP 04/13/2022 (Approximate)   Appears well, no apparent distress  No results found for this or any previous visit (from the past 24 hour(s)).  NST: FHR baseline 135 bpm, Variability: moderate, Accelerations:present, Decelerations:  Absent= Cat 1/reactive Toco: none   ASSESSMENT: [id number] at [redacted]w[redacted]d with history of IUFD NST reactive  PLAN: EFM strip reviewed by Dr. Charlotta Newton   Recommendations: keep next appointment as scheduled    Jobe Marker  11/20/2022 10:43 AM

## 2022-11-21 ENCOUNTER — Other Ambulatory Visit: Payer: Self-pay | Admitting: Obstetrics & Gynecology

## 2022-11-21 DIAGNOSIS — Z8759 Personal history of other complications of pregnancy, childbirth and the puerperium: Secondary | ICD-10-CM

## 2022-11-22 ENCOUNTER — Ambulatory Visit (INDEPENDENT_AMBULATORY_CARE_PROVIDER_SITE_OTHER): Payer: Medicaid Other

## 2022-11-22 ENCOUNTER — Encounter: Payer: Self-pay | Admitting: Obstetrics & Gynecology

## 2022-11-22 ENCOUNTER — Ambulatory Visit (INDEPENDENT_AMBULATORY_CARE_PROVIDER_SITE_OTHER): Payer: Medicaid Other | Admitting: Obstetrics & Gynecology

## 2022-11-22 VITALS — BP 119/76 | HR 102 | Wt 263.0 lb

## 2022-11-22 DIAGNOSIS — Z98891 History of uterine scar from previous surgery: Secondary | ICD-10-CM

## 2022-11-22 DIAGNOSIS — O0990 Supervision of high risk pregnancy, unspecified, unspecified trimester: Secondary | ICD-10-CM

## 2022-11-22 DIAGNOSIS — Z8759 Personal history of other complications of pregnancy, childbirth and the puerperium: Secondary | ICD-10-CM | POA: Diagnosis not present

## 2022-11-22 DIAGNOSIS — Z3A33 33 weeks gestation of pregnancy: Secondary | ICD-10-CM

## 2022-11-22 NOTE — Progress Notes (Signed)
HIGH-RISK PREGNANCY VISIT Patient name: Jade Taylor MRN [id number]  Date of birth: 31-Jul-1991 Chief Complaint:   Routine Prenatal Visit, High Risk Gestation, and Pregnancy Ultrasound  History of Present Illness:   Jade Taylor is a 31 y.o. [id number] female at [redacted]w[redacted]d with an Estimated Date of Delivery: 01/10/23 being seen today for ongoing management of a high-risk pregnancy complicated by IUFD x 2, normal testing.    Today she reports no complaints. Contractions: Not present.  .  Movement: Present. denies leaking of fluid.      07/01/2022   10:40 AM 02/20/2022    2:32 PM 07/03/2020    2:52 PM 07/10/2016    2:59 PM  Depression screen PHQ 2/9  Decreased Interest 0 0 0 0  Down, Depressed, Hopeless 0 0 0 0  PHQ - 2 Score 0 0 0 0  Altered sleeping 0 2 1 1   Tired, decreased energy 1 1 1  0  Change in appetite 0 0 0 0  Feeling bad or failure about yourself  0 0 0 0  Trouble concentrating 0 2 1 0  Moving slowly or fidgety/restless 0 1 0 0  Suicidal thoughts 0 0 0 0  PHQ-9 Score 1 6 3 1   Difficult doing work/chores   Not difficult at all         07/01/2022   10:40 AM 02/20/2022    2:34 PM 07/03/2020    2:53 PM  GAD 7 : Generalized Anxiety Score  Nervous, Anxious, on Edge 0 1 1  Control/stop worrying 0 1 0  Worry too much - different things 0 1 0  Trouble relaxing 0 1 0  Restless 0 1 1  Easily annoyed or irritable 0 0 1  Afraid - awful might happen 0 1 1  Total GAD 7 Score 0 6 4  Anxiety Difficulty   Not difficult at all     Review of Systems:   Pertinent items are noted in HPI Denies abnormal vaginal discharge w/ itching/odor/irritation, headaches, visual changes, shortness of breath, chest pain, abdominal pain, severe nausea/vomiting, or problems with urination or bowel movements unless otherwise stated above. Pertinent History Reviewed:  Reviewed past medical,surgical, social, obstetrical and family history.  Reviewed problem list, medications and  allergies. Physical Assessment:   Vitals:   11/22/22 1157 11/22/22 1212  BP: (!) 130/90 119/76  Pulse: 98 (!) 102  Weight: 263 lb (119.3 kg)   Body mass index is 48.1 kg/m.           Physical Examination:   General appearance: alert, well appearing, and in no distress  Mental status: alert, oriented to person, place, and time  Skin: warm & dry   Extremities: Edema: Trace    Cardiovascular: normal heart rate noted  Respiratory: normal respiratory effort, no distress  Abdomen: gravid, soft, non-tender  Pelvic: Cervical exam deferred         Fetal Status:     Movement: Present    Fetal Surveillance Testing today: BPP 8/8   Chaperone: N/A    No results found for this or any previous visit (from the past 24 hour(s)).  Assessment & Plan:  High-risk pregnancy: 09/02/2020 at [redacted]w[redacted]d with an Estimated Date of Delivery: 01/10/23      ICD-10-CM   1. Supervision of high risk pregnancy, antepartum  O09.90     2. History of IUFD x 2  Z87.59     3. History of cesarean delivery x 2  Z98.891  Meds: No orders of the defined types were placed in this encounter.   Orders: No orders of the defined types were placed in this encounter.    Labs/procedures today: U/S  Treatment Plan:  twice weekly surveillance, repeat section + BTL 12/21/21 LHE    Follow-up: Return for keep scheduled.   Future Appointments  Date Time Provider [place]  11/26/2022  8:30 AM [facility] NURSE [facility]  11/29/2022 10:00 AM [facility] [place] [facility] None  11/29/2022 11:10 AM Florian Buff, MD [facility]  12/03/2022  9:50 AM [facility] NURSE [facility]  12/06/2022 10:00 AM [facility] [place] [facility] None  12/06/2022 10:50 AM Janyth Pupa, DO [facility]  12/10/2022  9:50 AM [facility] NURSE [facility]  12/13/2022 10:00 AM [facility] [place] [facility] None  12/13/2022 10:50 AM Florian Buff, MD [facility]  12/17/2022 10:30 AM [facility] NURSE [facility]  12/20/2022   9:15 AM [facility] [place] [facility] None  12/20/2022 10:10 AM Florian Buff, MD [facility]  12/27/2022  8:30 AM [facility] [place] [facility] None    No orders of the defined types were placed in this encounter.  Florian Buff  Attending Physician for the [facility] 11/22/2022 12:38 PM

## 2022-11-22 NOTE — Progress Notes (Signed)
[place] 33 wks,cephalic,BPP 8/8,anterior placenta gr 3,AFI 13 cm,FHR 138 bpm,limited view

## 2022-11-26 ENCOUNTER — Ambulatory Visit (INDEPENDENT_AMBULATORY_CARE_PROVIDER_SITE_OTHER): Payer: Medicaid Other | Admitting: *Deleted

## 2022-11-26 VITALS — BP 103/63 | HR 91 | Wt 268.0 lb

## 2022-11-26 DIAGNOSIS — Z8759 Personal history of other complications of pregnancy, childbirth and the puerperium: Secondary | ICD-10-CM | POA: Diagnosis not present

## 2022-11-26 DIAGNOSIS — O0990 Supervision of high risk pregnancy, unspecified, unspecified trimester: Secondary | ICD-10-CM | POA: Diagnosis not present

## 2022-11-26 DIAGNOSIS — Z3A33 33 weeks gestation of pregnancy: Secondary | ICD-10-CM | POA: Diagnosis not present

## 2022-11-26 NOTE — Progress Notes (Signed)
   NURSE VISIT- NST  SUBJECTIVE:  Jade Taylor is a 32 y.o. [phone] female at [redacted]w[redacted]d, here for a NST for pregnancy complicated by hx IUFD x2   She reports active fetal movement, contractions: none, vaginal bleeding: none, membranes: intact.   OBJECTIVE:  BP 103/63   Pulse 91   Wt 268 lb (121.6 kg)   LMP 04/13/2022 (Approximate)   BMI 49.02 kg/m   Appears well, no apparent distress  No results found for this or any previous visit (from the past 24 hour(s)).  NST: FHR baseline 130 bpm, Variability: moderate, Accelerations:present, Decelerations:  Absent= Cat 1/reactive Toco: none   ASSESSMENT: [id number] at [redacted]w[redacted]d with hx IUFD x2 NST reactive  PLAN: EFM strip reviewed by Dr. Elonda Husky   Recommendations: keep next appointment as scheduled    [name]  11/26/2022 9:38 AM

## 2022-11-28 ENCOUNTER — Other Ambulatory Visit: Payer: Self-pay | Admitting: Obstetrics & Gynecology

## 2022-11-28 DIAGNOSIS — Z8759 Personal history of other complications of pregnancy, childbirth and the puerperium: Secondary | ICD-10-CM

## 2022-11-29 ENCOUNTER — Encounter: Payer: Self-pay | Admitting: Obstetrics & Gynecology

## 2022-11-29 ENCOUNTER — Ambulatory Visit (INDEPENDENT_AMBULATORY_CARE_PROVIDER_SITE_OTHER): Payer: Medicaid Other

## 2022-11-29 ENCOUNTER — Ambulatory Visit (INDEPENDENT_AMBULATORY_CARE_PROVIDER_SITE_OTHER): Payer: Medicaid Other | Admitting: Obstetrics & Gynecology

## 2022-11-29 VITALS — BP 137/85 | HR 92 | Wt 262.5 lb

## 2022-11-29 DIAGNOSIS — Z8759 Personal history of other complications of pregnancy, childbirth and the puerperium: Secondary | ICD-10-CM

## 2022-11-29 DIAGNOSIS — Z3A34 34 weeks gestation of pregnancy: Secondary | ICD-10-CM | POA: Diagnosis not present

## 2022-11-29 DIAGNOSIS — O0990 Supervision of high risk pregnancy, unspecified, unspecified trimester: Secondary | ICD-10-CM

## 2022-11-29 LAB — POCT URINALYSIS DIPSTICK OB
Blood, UA: NEGATIVE
Glucose, UA: NEGATIVE
Ketones, UA: NEGATIVE
Nitrite, UA: NEGATIVE
POC,PROTEIN,UA: NEGATIVE

## 2022-11-29 NOTE — Progress Notes (Signed)
HIGH-RISK PREGNANCY VISIT Patient name: Jade Taylor MRN [id number]  Date of birth: 14-Jan-1991 Chief Complaint:   High Risk Gestation ([place] today; nose bleeding, nausea)  History of Present Illness:   Jade Taylor is a 32 y.o. [id number] female at [redacted]w[redacted]d with an Estimated Date of Delivery: 01/10/23 being seen today for ongoing management of a high-risk pregnancy complicated by IUFD x 2, normal testing.    Today she reports no complaints. Contractions: Not present. Vag. Bleeding: None.  Movement: Present. denies leaking of fluid.      07/01/2022   10:40 AM 02/20/2022    2:32 PM 07/03/2020    2:52 PM 07/10/2016    2:59 PM  Depression screen PHQ 2/9  Decreased Interest 0 0 0 0  Down, Depressed, Hopeless 0 0 0 0  PHQ - 2 Score 0 0 0 0  Altered sleeping 0 2 1 1   Tired, decreased energy 1 1 1  0  Change in appetite 0 0 0 0  Feeling bad or failure about yourself  0 0 0 0  Trouble concentrating 0 2 1 0  Moving slowly or fidgety/restless 0 1 0 0  Suicidal thoughts 0 0 0 0  PHQ-9 Score 1 6 3 1   Difficult doing work/chores   Not difficult at all         07/01/2022   10:40 AM 02/20/2022    2:34 PM 07/03/2020    2:53 PM  GAD 7 : Generalized Anxiety Score  Nervous, Anxious, on Edge 0 1 1  Control/stop worrying 0 1 0  Worry too much - different things 0 1 0  Trouble relaxing 0 1 0  Restless 0 1 1  Easily annoyed or irritable 0 0 1  Afraid - awful might happen 0 1 1  Total GAD 7 Score 0 6 4  Anxiety Difficulty   Not difficult at all     Review of Systems:   Pertinent items are noted in HPI Denies abnormal vaginal discharge w/ itching/odor/irritation, headaches, visual changes, shortness of breath, chest pain, abdominal pain, severe nausea/vomiting, or problems with urination or bowel movements unless otherwise stated above. Pertinent History Reviewed:  Reviewed past medical,surgical, social, obstetrical and family history.  Reviewed problem list, medications and  allergies. Physical Assessment:   Vitals:   11/29/22 1118  BP: 137/85  Pulse: 92  Weight: 262 lb 8 oz (119.1 kg)  Body mass index is 48.01 kg/m.           Physical Examination:   General appearance: alert, well appearing, and in no distress  Mental status: alert, oriented to person, place, and time  Skin: warm & dry   Extremities:      Cardiovascular: normal heart rate noted  Respiratory: normal respiratory effort, no distress  Abdomen: gravid, soft, non-tender  Pelvic: Cervical exam deferred         Fetal Status:     Movement: Present    Fetal Surveillance Testing today: BPP 8/8   Chaperone: N/A    Results for orders placed or performed in visit on 11/29/22 (from the past 24 hour(s))  POC Urinalysis Dipstick OB   Collection Time: 11/29/22 11:23 AM  Result Value Ref Range   Color, UA     Clarity, UA     Glucose, UA Negative Negative   Bilirubin, UA     Ketones, UA neg    Spec Grav, UA     Blood, UA neg    pH, UA  POC,PROTEIN,UA Negative Negative, Trace, Small (1+), Moderate (2+), Large (3+), 4+   Urobilinogen, UA     Nitrite, UA neg    Leukocytes, UA Trace (A) Negative   Appearance     Odor      Assessment & Plan:  High-risk pregnancy: [id number] at [redacted]w[redacted]d with an Estimated Date of Delivery: 01/10/23      ICD-10-CM   1. Supervision of high risk pregnancy, antepartum  O09.90 POC Urinalysis Dipstick OB    2. History of IUFD  Z87.59     3. [redacted] weeks gestation of pregnancy  Z3A.34 POC Urinalysis Dipstick OB         Meds: No orders of the defined types were placed in this encounter.   Orders:  Orders Placed This Encounter  Procedures   POC Urinalysis Dipstick OB     Labs/procedures today: U/S  Treatment Plan:  twice weekly surveillance, repeat section + BTL 12/21/21 LHE    Follow-up: No follow-ups on file.   Future Appointments  Date Time Provider [place]  12/03/2022  9:50 AM [facility] NURSE [facility]  12/06/2022  9:15 AM [facility] -  [facility] [place] [facility] None  12/06/2022 10:10 AM Janyth Pupa, DO [facility]  12/10/2022  9:50 AM [facility] NURSE [facility]  12/13/2022 10:00 AM [facility] [place] [facility] None  12/13/2022 10:50 AM Florian Buff, MD [facility]  12/17/2022 10:30 AM [facility] NURSE [facility]  12/20/2022  9:15 AM [facility] [place] [facility] None  12/20/2022 10:10 AM Florian Buff, MD [facility]    Orders Placed This Encounter  Procedures   POC Urinalysis Dipstick OB   Florian Buff  Attending Physician for [facility] 11/29/2022 11:36 AM

## 2022-11-29 NOTE — Progress Notes (Signed)
[place] 34 wks,cephalic,anterior placenta gr 3,BPP 8/8,FHR 138 bpm,AFI 10.2 cm,limited view

## 2022-12-03 ENCOUNTER — Ambulatory Visit (INDEPENDENT_AMBULATORY_CARE_PROVIDER_SITE_OTHER): Payer: Medicaid Other | Admitting: *Deleted

## 2022-12-03 VITALS — BP 125/75 | HR 99 | Wt 262.1 lb

## 2022-12-03 DIAGNOSIS — Z3A34 34 weeks gestation of pregnancy: Secondary | ICD-10-CM

## 2022-12-03 DIAGNOSIS — Z8759 Personal history of other complications of pregnancy, childbirth and the puerperium: Secondary | ICD-10-CM | POA: Diagnosis not present

## 2022-12-03 DIAGNOSIS — O0990 Supervision of high risk pregnancy, unspecified, unspecified trimester: Secondary | ICD-10-CM | POA: Diagnosis not present

## 2022-12-03 NOTE — Progress Notes (Signed)
   NURSE VISIT- NST  SUBJECTIVE:  Jade Taylor is a 32 y.o. [phone] female at [redacted]w[redacted]d, here for a NST for pregnancy complicated by hx IUFD x2.  She reports active fetal movement, contractions: none, vaginal bleeding: none, membranes: intact.   OBJECTIVE:  BP 125/75   Pulse 99   Wt 262 lb 1.6 oz (118.9 kg)   LMP 04/13/2022 (Approximate)   BMI 47.94 kg/m   Appears well, no apparent distress  No results found for this or any previous visit (from the past 24 hour(s)).  NST: FHR baseline 140 bpm, Variability: moderate, Accelerations:present, Decelerations:  Absent= Cat 1/reactive Toco: none   ASSESSMENT: [id number] at [redacted]w[redacted]d with hx IUFD x2 NST reactive  PLAN: EFM strip reviewed by Dr. Nelda Marseille   Recommendations: keep next appointment as scheduled    Jade Taylor  12/03/2022 10:19 AM

## 2022-12-05 ENCOUNTER — Other Ambulatory Visit: Payer: Self-pay | Admitting: Obstetrics & Gynecology

## 2022-12-05 DIAGNOSIS — Z8759 Personal history of other complications of pregnancy, childbirth and the puerperium: Secondary | ICD-10-CM

## 2022-12-06 ENCOUNTER — Encounter: Payer: Medicaid Other | Admitting: Obstetrics & Gynecology

## 2022-12-06 ENCOUNTER — Inpatient Hospital Stay (HOSPITAL_COMMUNITY)
Admission: RE | Admit: 2022-12-06 | Payer: Medicaid Other | Source: Home / Self Care | Admitting: Obstetrics & Gynecology

## 2022-12-06 ENCOUNTER — Encounter: Payer: Self-pay | Admitting: Obstetrics & Gynecology

## 2022-12-06 ENCOUNTER — Ambulatory Visit (INDEPENDENT_AMBULATORY_CARE_PROVIDER_SITE_OTHER): Payer: Medicaid Other | Admitting: Obstetrics & Gynecology

## 2022-12-06 ENCOUNTER — Ambulatory Visit (INDEPENDENT_AMBULATORY_CARE_PROVIDER_SITE_OTHER): Payer: Medicaid Other

## 2022-12-06 VITALS — BP 142/83 | HR 108 | Wt 264.0 lb

## 2022-12-06 DIAGNOSIS — Z3A35 35 weeks gestation of pregnancy: Secondary | ICD-10-CM | POA: Diagnosis not present

## 2022-12-06 DIAGNOSIS — Z8759 Personal history of other complications of pregnancy, childbirth and the puerperium: Secondary | ICD-10-CM | POA: Diagnosis not present

## 2022-12-06 DIAGNOSIS — O0990 Supervision of high risk pregnancy, unspecified, unspecified trimester: Secondary | ICD-10-CM

## 2022-12-06 DIAGNOSIS — Z98891 History of uterine scar from previous surgery: Secondary | ICD-10-CM

## 2022-12-06 LAB — POCT URINALYSIS DIPSTICK OB
Blood, UA: NEGATIVE
Glucose, UA: NEGATIVE
Ketones, UA: NEGATIVE
Nitrite, UA: NEGATIVE
POC,PROTEIN,UA: NEGATIVE

## 2022-12-06 NOTE — Progress Notes (Signed)
HIGH-RISK PREGNANCY VISIT Patient name: Jade Taylor MRN [id number]  Date of birth: Apr 21, 1991 Chief Complaint:   Routine Prenatal Visit  History of Present Illness:   Jade Taylor is a 32 y.o. L4T6256 female at [redacted]w[redacted]d with an Estimated Date of Delivery: 01/10/23 being seen today for ongoing management of a high-risk pregnancy complicated by: -h/o IUFD x 2 -Anxiety -prior C-section- repeat scheduled.    Today she reports no complaints.   Contractions: Not present. Vag. Bleeding: None.  Movement: Present. denies leaking of fluid.      07/01/2022   10:40 AM 02/20/2022    2:32 PM 07/03/2020    2:52 PM 07/10/2016    2:59 PM  Depression screen PHQ 2/9  Decreased Interest 0 0 0 0  Down, Depressed, Hopeless 0 0 0 0  PHQ - 2 Score 0 0 0 0  Altered sleeping 0 2 1 1   Tired, decreased energy 1 1 1  0  Change in appetite 0 0 0 0  Feeling bad or failure about yourself  0 0 0 0  Trouble concentrating 0 2 1 0  Moving slowly or fidgety/restless 0 1 0 0  Suicidal thoughts 0 0 0 0  PHQ-9 Score 1 6 3 1   Difficult doing work/chores   Not difficult at all      Current Outpatient Medications  Medication Instructions   albuterol (VENTOLIN HFA) 108 (90 Base) MCG/ACT inhaler 2 puffs, Inhalation, Every 6 hours PRN   aspirin EC 81 mg, Oral, Daily, Swallow whole.   hydrOXYzine (ATARAX) 25 mg, Oral, 3 times daily PRN   Prenatal Vit-Fe Fumarate-FA (WESTAB PLUS) 27-1 MG TABS 1 tablet, Oral, Daily     Review of Systems:   Pertinent items are noted in HPI Denies abnormal vaginal discharge w/ itching/odor/irritation, headaches, visual changes, shortness of breath, chest pain, abdominal pain, severe nausea/vomiting, or problems with urination or bowel movements unless otherwise stated above. Pertinent History Reviewed:  Reviewed past medical,surgical, social, obstetrical and family history.  Reviewed problem list, medications and allergies. Physical Assessment:   Vitals:   12/06/22 1005  12/06/22 1012  BP: (!) 139/93 (!) 142/83  Pulse: (!) 101 (!) 108  Weight: 264 lb (119.7 kg)   Body mass index is 48.29 kg/m.           Physical Examination:   General appearance: alert, well appearing, and in no distress  Mental status: normal mood, behavior, speech, dress, motor activity, and thought processes  Skin: warm & dry   Extremities:      Cardiovascular: normal heart rate noted  Respiratory: normal respiratory effort, no distress  Abdomen: gravid, soft, non-tender  Pelvic: Cervical exam deferred         Fetal Status:     Movement: Present    Fetal Surveillance Testing today:  cephalic,BPP 3/8,LHT 34.2 cm,FHR 142 bpm,anterior placenta gr 3       Chaperone: N/A    Results for orders placed or performed in visit on 12/06/22 (from the past 24 hour(s))  POC Urinalysis Dipstick OB   Collection Time: 12/06/22 10:11 AM  Result Value Ref Range   Color, UA     Clarity, UA     Glucose, UA Negative Negative   Bilirubin, UA     Ketones, UA neg    Spec Grav, UA     Blood, UA neg    pH, UA     POC,PROTEIN,UA Negative Negative, Trace, Small (1+), Moderate (2+), Large (3+), 4+   Urobilinogen,  UA     Nitrite, UA neg    Leukocytes, UA Trace (A) Negative   Appearance     Odor       Assessment & Plan:  High-risk pregnancy: [id number] at [redacted]w[redacted]d with an Estimated Date of Delivery: 01/10/23   1) h/o IUFD -reviewed antepartum testing today, BPP 8/8 -continue antepartum testing -scheduled for delivery @ 37wk  2) Elevated BP today -encouraged pt to restart checking at home daily, reviewed precautions and encouraged to enter in babyscripts -UA negative, no evidence of preeclampssia  Meds: No orders of the defined types were placed in this encounter.   Labs/procedures today: BPP  Treatment Plan:  as outlined above  Reviewed: Preterm labor symptoms and general obstetric precautions including but not limited to vaginal bleeding, contractions, leaking of fluid and fetal movement  were reviewed in detail with the patient.  All questions were answered. Pt has home bp cuff. Check bp weekly, let us know if >140/90.   Follow-up: Return in about 1 week (around 12/13/2022) for twice weekly as scheduled.   Future Appointments  Date Time Provider [place]  12/10/2022  9:50 AM [facility] NURSE [facility]  12/13/2022 10:00 AM [facility] - [facility] [place] [facility] None  12/13/2022 10:50 AM Florian Buff, MD [facility]  12/17/2022 10:30 AM [facility] NURSE [facility]    Orders Placed This Encounter  Procedures   POC Urinalysis Dipstick OB    Janyth Pupa, DO Attending [place], [place] for [employer], [place]

## 2022-12-06 NOTE — Progress Notes (Signed)
[place] 35 wks,cephalic,BPP 8/1,MMC 37.5 cm,FHR 142 bpm,anterior placenta gr 3

## 2022-12-09 ENCOUNTER — Encounter (HOSPITAL_COMMUNITY): Payer: Self-pay

## 2022-12-09 NOTE — Patient Instructions (Addendum)
Reve Crocket Greer  12/09/2022   Your procedure is scheduled on:  12/21/2022  Arrive at [place] at [employer] [address] on [address] at [facility]  and [address]. You are invited to use the FREE valet parking or use the Visitor's parking deck.  Pick up the phone at the desk and dial [phone].  Call this number if you have problems the morning of surgery: [phone]  Remember:   Do not eat food:(After Midnight) Desps de medianoche.  Do not drink clear liquids: (After Midnight) Desps de medianoche.  Take these medicines the morning of surgery with A SIP OF WATER:  Bring your inhaler with you   Do not wear jewelry, make-up or nail polish.  Do not wear lotions, powders, or perfumes. Do not wear deodorant.  Do not shave 48 hours prior to surgery.  Do not bring valuables to the hospital.  [facility] is not   responsible for any belongings or valuables brought to the hospital.  Contacts, dentures or bridgework may not be worn into surgery.  Leave suitcase in the car. After surgery it may be brought to your room.  For patients admitted to the hospital, checkout time is 11:00 AM the day of              discharge.      Please read over the following fact sheets that you were given:     Preparing for Surgery

## 2022-12-10 ENCOUNTER — Encounter (HOSPITAL_COMMUNITY): Payer: Self-pay | Admitting: Obstetrics & Gynecology

## 2022-12-10 ENCOUNTER — Inpatient Hospital Stay (HOSPITAL_COMMUNITY)
Admission: AD | Admit: 2022-12-10 | Discharge: 2022-12-11 | Disposition: A | Discharge status: Home / Self Care | Payer: Medicaid Other | Attending: Obstetrics & Gynecology | Admitting: Obstetrics & Gynecology

## 2022-12-10 ENCOUNTER — Other Ambulatory Visit: Payer: Self-pay

## 2022-12-10 ENCOUNTER — Ambulatory Visit (INDEPENDENT_AMBULATORY_CARE_PROVIDER_SITE_OTHER): Payer: Medicaid Other | Admitting: *Deleted

## 2022-12-10 ENCOUNTER — Encounter: Payer: Self-pay | Admitting: Obstetrics & Gynecology

## 2022-12-10 VITALS — BP 114/76 | HR 101 | Wt 264.3 lb

## 2022-12-10 DIAGNOSIS — O288 Other abnormal findings on antenatal screening of mother: Secondary | ICD-10-CM

## 2022-12-10 DIAGNOSIS — Z3A35 35 weeks gestation of pregnancy: Secondary | ICD-10-CM

## 2022-12-10 DIAGNOSIS — O368130 Decreased fetal movements, third trimester, not applicable or unspecified: Secondary | ICD-10-CM

## 2022-12-10 DIAGNOSIS — O0993 Supervision of high risk pregnancy, unspecified, third trimester: Secondary | ICD-10-CM

## 2022-12-10 DIAGNOSIS — N858 Other specified noninflammatory disorders of uterus: Secondary | ICD-10-CM | POA: Diagnosis not present

## 2022-12-10 DIAGNOSIS — Z8759 Personal history of other complications of pregnancy, childbirth and the puerperium: Secondary | ICD-10-CM | POA: Diagnosis not present

## 2022-12-10 DIAGNOSIS — O26893 Other specified pregnancy related conditions, third trimester: Secondary | ICD-10-CM | POA: Diagnosis not present

## 2022-12-10 DIAGNOSIS — O0990 Supervision of high risk pregnancy, unspecified, unspecified trimester: Secondary | ICD-10-CM

## 2022-12-10 DIAGNOSIS — R1030 Lower abdominal pain, unspecified: Secondary | ICD-10-CM | POA: Insufficient documentation

## 2022-12-10 DIAGNOSIS — R1031 Right lower quadrant pain: Secondary | ICD-10-CM | POA: Diagnosis not present

## 2022-12-10 DIAGNOSIS — Z331 Pregnant state, incidental: Secondary | ICD-10-CM

## 2022-12-10 DIAGNOSIS — Z1389 Encounter for screening for other disorder: Secondary | ICD-10-CM

## 2022-12-10 LAB — POCT URINALYSIS DIPSTICK OB
Blood, UA: NEGATIVE
Glucose, UA: NEGATIVE
Ketones, UA: NEGATIVE
Leukocytes, UA: NEGATIVE
Nitrite, UA: NEGATIVE
POC,PROTEIN,UA: NEGATIVE

## 2022-12-10 MED ORDER — NIFEDIPINE 10 MG PO CAPS
10.0000 mg | ORAL_CAPSULE | ORAL | Status: DC | PRN
  Administered 2022-12-10: 10 mg via ORAL
  Filled 2022-12-10: qty 1

## 2022-12-10 NOTE — MAU Provider Note (Addendum)
Chief Complaint:  Decreased Fetal Movement and Abdominal Pain   Event Date/Time   First Provider Initiated Contact with Patient 12/10/22 2311     HPI: Jade Taylor is a 32 y.o. [id number] at 13w4dwho presents to maternity admissions reporting decreased fetal movement and pain in right lower abdomen.  Was seen in office earlier today. . She denies LOF, vaginal bleeding, vaginal itching/burning, urinary symptoms, h/a, dizziness, n/v, diarrhea, constipation or fever/chills.    Abdominal Pain This is a recurrent problem. The current episode started today. The problem occurs intermittently. The quality of the pain is cramping. The abdominal pain does not radiate. Pertinent negatives include no constipation, diarrhea, fever, frequency or myalgias. Nothing aggravates the pain. The pain is relieved by Nothing. She has tried nothing for the symptoms.   RN Note: Jade Taylor is a 32 y.o. at [redacted]w[redacted]d here in MAU reporting decreased FM all day and RLQ pain. Was seen at [facility] today and had NST that pt was told was ok. Denies VB or LOF. 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 Luna Glasgow   PTSD (post-traumatic stress disorder)     Past obstetric history: OB History  Gravida Para Term Preterm AB Living  6 3 1 2 2 1   SAB IAB Ectopic Multiple Live Births  1 1   0 1    # Outcome Date GA Lbr Len/2nd Weight Sex Delivery Anes PTL Lv  6 Current           5 Term 01/23/17 [redacted]w[redacted]d  3370 g M CS-LTranv EPI N LIV     Complications: Failure to Progress in First Stage  4 Preterm 09/13/15 [redacted]w[redacted]d   F Vag-Spont  N FD     Birth Comments: unclear etiology, small 1cm placental bleed on pathology     Complications: IUFD (intrauterine fetal death), Antepartum placental abruption  3 SAB 02/24/15 [redacted]w[redacted]d         2 IAB 08/25/14           1 Preterm 01/26/14 [redacted]w[redacted]d   F   N FD     Birth Comments: fetus 20wk size, cytotec IOL, true knot     Complications: IUFD (intrauterine fetal death), True knot in cord    Past Surgical History: Past Surgical History:  Procedure Laterality Date   CARPAL TUNNEL RELEASE     CESAREAN SECTION N/A 01/23/2017   Procedure: CESAREAN SECTION;  Surgeon: Lavonia Drafts, MD;  Location: [place];  Service: Obstetrics;  Laterality: N/A;   CHOLECYSTECTOMY N/A 08/17/2018   Procedure: LAPAROSCOPIC CHOLECYSTECTOMY;  Surgeon: Aviva Signs, MD;  Location: [facility];  Service: General;  Laterality: N/A;   WISDOM TOOTH EXTRACTION      Family History: Family History  Problem Relation Age of Onset   Bipolar disorder Mother    Schizophrenia Mother    Diabetes Mother    Hypertension Mother    Kidney disease Mother    Stroke Father    Heart disease Maternal Grandmother    Heart disease Paternal Grandfather     Social History: Social History   Tobacco Use   Smoking status: Former    Packs/day: 0.50    Years: 0.50    Total pack years: 0.25    Types: Cigarettes   Smokeless  tobacco: Never  Vaping Use   Vaping Use: Former  Substance Use Topics   Alcohol use: Yes    Comment: occ   Drug use: Not Currently    Types: Marijuana    Comment: prior to pregnancy    Allergies:  Allergies  Allergen Reactions   Metronidazole Hives, Shortness Of Breath, Rash and Other (See Comments)    Rash, Burning sensation- was hospitalized   Penicillins Shortness Of Breath, Itching and Rash    Has patient had a PCN reaction causing immediate rash, facial/tongue/throat swelling, SOB or lightheadedness with hypotension: No Has patient had a PCN reaction causing severe rash involving mucus membranes or skin necrosis: No Has patient had a PCN reaction that required hospitalization Yes Has patient had a PCN reaction occurring within the last 10 years: No If all of the above answers are "NO", then may proceed  with Cephalosporin use.    Amoxicillin Hives    Has patient had a PCN reaction causing immediate rash, facial/tongue/throat swelling, SOB or lightheadedness with hypotension: Yes Has patient had a PCN reaction causing severe rash involving mucus membranes or skin necrosis: No Has patient had a PCN reaction that required hospitalization No Has patient had a PCN reaction occurring within the last 10 years: No If all of the above answers are "NO", then may proceed with Cephalosporin use.    Tape Rash    Surgical tape, bandaids    Meds:  Medications Prior to Admission  Medication Sig Dispense Refill Last Dose   aspirin EC 81 MG tablet Take 1 tablet (81 mg total) by mouth daily. Swallow whole. 90 tablet 3 12/10/2022 at 1800   Prenatal Vit-Fe Fumarate-FA (WESTAB PLUS) 27-1 MG TABS Take 1 tablet by mouth daily. 100 tablet 11 12/10/2022 at 1800   albuterol (VENTOLIN HFA) 108 (90 Base) MCG/ACT inhaler Inhale 2 puffs into the lungs every 6 (six) hours as needed for wheezing or shortness of breath. (Patient not taking: Reported on 12/10/2022) 8 g 2    hydrOXYzine (ATARAX) 25 MG tablet Take 1 tablet (25 mg total) by mouth 3 (three) times daily as needed for anxiety. (Patient not taking: Reported on 12/10/2022) 30 tablet 0     I have reviewed patient's Past Medical Hx, Surgical Hx, Family Hx, Social Hx, medications and allergies.   ROS:  Review of Systems  Constitutional:  Negative for fever.  Gastrointestinal:  Positive for abdominal pain. Negative for constipation and diarrhea.  Genitourinary:  Negative for frequency.  Musculoskeletal:  Negative for myalgias.   Other systems negative  Physical Exam  Patient Vitals for the past 24 hrs:  BP Temp Pulse Resp SpO2 Height Weight  12/10/22 2233 129/77 -- -- -- -- -- --  12/10/22 2231 -- 98 F (36.7 C) (!) 105 18 99 % 5\' 2"  (1.575 m) 118.8 kg   Constitutional: Well-developed, well-nourished female in no acute distress.  Cardiovascular: normal rate   Respiratory: normal effort GI: Abd soft, non-tender, gravid appropriate for gestational age.   No rebound or guarding. MS: Extremities nontender, no edema, normal ROM Neurologic: Alert and oriented x 4.  GU: Neg CVAT.  PELVIC EXAM: Dilation: Closed Effacement (%): 50 Station: -3, Ballotable Exam by:: [email or web address] CNM    FHT:  Baseline 140 , moderate variability, accelerations present, no decelerations Contractions: Uteirne irritability with some contractions   Labs: Results for orders placed or performed in visit on 12/10/22 (from the past 24 hour(s))  POC Urinalysis Dipstick OB     Status:  None   Collection Time: 12/10/22 10:37 AM  Result Value Ref Range   Color, UA     Clarity, UA     Glucose, UA Negative Negative   Bilirubin, UA     Ketones, UA negative    Spec Grav, UA     Blood, UA negative    pH, UA     POC,PROTEIN,UA Negative Negative, Trace, Small (1+), Moderate (2+), Large (3+), 4+   Urobilinogen, UA     Nitrite, UA negative    Leukocytes, UA Negative Negative   Appearance     Odor      A/Positive/-- (08/07 1146)  Imaging:    MAU Course/MDM: I have reviewed the triage vital signs and the nursing notes.   Pertinent labs & imaging results that were available during my care of the patient were reviewed by me and considered in my medical decision making (see chart for details).      I have reviewed her medical records including past results, notes and treatments.   I have ordered labs and reviewed results.  NST reviewed Treatments in MAU included Procardia series x 1 dose with resolution of irritability..    Assessment: [place] IUP at [redacted]w[redacted]d Uterine irritability Lower abdominal pain Decreased fetal movement Reactive FHR pattern  Plan: Discharge home Labor precautions and fetal kick counts Follow up in Office for prenatal visits  Encouraged to return if she develops worsening of symptoms, increase in pain, fever, or other concerning symptoms.   Pt  stable at time of discharge.  Hansel Feinstein CNM, MSN Certified Nurse-Midwife 12/10/2022 11:11 PM

## 2022-12-10 NOTE — Progress Notes (Signed)
   NURSE VISIT- NST  SUBJECTIVE:  Jade Taylor is a 32 y.o. [phone] female at [redacted]w[redacted]d, here for a NST for pregnancy complicated by hx IUFD x2  She reports active fetal movement, contractions: none, vaginal bleeding: none, membranes: intact.   OBJECTIVE:  BP 114/76   Pulse (!) 101   Wt 264 lb 4.8 oz (119.9 kg)   LMP 04/13/2022 (Approximate)   BMI 48.34 kg/m   Appears well, no apparent distress  Results for orders placed or performed in visit on 12/10/22 (from the past 24 hour(s))  POC Urinalysis Dipstick OB   Collection Time: 12/10/22 10:37 AM  Result Value Ref Range   Color, UA     Clarity, UA     Glucose, UA Negative Negative   Bilirubin, UA     Ketones, UA negative    Spec Grav, UA     Blood, UA negative    pH, UA     POC,PROTEIN,UA Negative Negative, Trace, Small (1+), Moderate (2+), Large (3+), 4+   Urobilinogen, UA     Nitrite, UA negative    Leukocytes, UA Negative Negative   Appearance     Odor      NST: FHR baseline 130 bpm, Variability: moderate, Accelerations:present, Decelerations:  Absent= Cat 1/reactive Toco: none   ASSESSMENT: [id number] at [redacted]w[redacted]d with hx IUFD x2 NST reactive  PLAN:Dr. Eure EFM strip reviewed by Dr. Elonda Husky   Recommendations: keep next appointment as scheduled    [name]  12/10/2022 11:41 AM

## 2022-12-10 NOTE — MAU Note (Addendum)
.  Jade Taylor is a 32 y.o. at [redacted]w[redacted]d here in MAU reporting decreased FM all day and RLQ pain. Was seen at [facility] today and had NST that pt was told was ok. Denies VB or LOF. Has anterior placenta and states is hard to monitor baby or do u/s  Onset of complaint: today Pain score: 7 Vitals:   12/10/22 2231 12/10/22 2233  BP:  129/77  Pulse: (!) 105   Resp: 18   Temp: 98 F (36.7 C)   SpO2: 99%      FHT:132 Lab orders placed from triage:

## 2022-12-10 NOTE — Progress Notes (Deleted)
Hansel Feinstein CNM in Triage to see pt and discuss test results. Written and verbal d/c instructions given and understanding voiced.

## 2022-12-11 ENCOUNTER — Other Ambulatory Visit: Payer: Self-pay | Admitting: Obstetrics & Gynecology

## 2022-12-11 DIAGNOSIS — O368130 Decreased fetal movements, third trimester, not applicable or unspecified: Secondary | ICD-10-CM

## 2022-12-11 DIAGNOSIS — N858 Other specified noninflammatory disorders of uterus: Secondary | ICD-10-CM

## 2022-12-11 DIAGNOSIS — Z8759 Personal history of other complications of pregnancy, childbirth and the puerperium: Secondary | ICD-10-CM

## 2022-12-11 DIAGNOSIS — R1031 Right lower quadrant pain: Secondary | ICD-10-CM

## 2022-12-11 DIAGNOSIS — Z3A35 35 weeks gestation of pregnancy: Secondary | ICD-10-CM

## 2022-12-13 ENCOUNTER — Other Ambulatory Visit (HOSPITAL_COMMUNITY)
Admission: RE | Admit: 2022-12-13 | Discharge: 2022-12-13 | Disposition: A | Discharge status: Home / Self Care | Payer: Medicaid Other | Source: Ambulatory Visit | Attending: Obstetrics & Gynecology | Admitting: Obstetrics & Gynecology

## 2022-12-13 ENCOUNTER — Ambulatory Visit (INDEPENDENT_AMBULATORY_CARE_PROVIDER_SITE_OTHER): Payer: Medicaid Other | Admitting: Obstetrics & Gynecology

## 2022-12-13 ENCOUNTER — Ambulatory Visit (INDEPENDENT_AMBULATORY_CARE_PROVIDER_SITE_OTHER): Payer: Medicaid Other

## 2022-12-13 VITALS — BP 131/81 | HR 99 | Wt 265.0 lb

## 2022-12-13 DIAGNOSIS — Z98891 History of uterine scar from previous surgery: Secondary | ICD-10-CM

## 2022-12-13 DIAGNOSIS — O09893 Supervision of other high risk pregnancies, third trimester: Secondary | ICD-10-CM | POA: Diagnosis present

## 2022-12-13 DIAGNOSIS — Z8759 Personal history of other complications of pregnancy, childbirth and the puerperium: Secondary | ICD-10-CM | POA: Diagnosis not present

## 2022-12-13 DIAGNOSIS — O0990 Supervision of high risk pregnancy, unspecified, unspecified trimester: Secondary | ICD-10-CM

## 2022-12-13 DIAGNOSIS — Z3A36 36 weeks gestation of pregnancy: Secondary | ICD-10-CM | POA: Insufficient documentation

## 2022-12-13 NOTE — Progress Notes (Signed)
[place] 36 wks,cephalic,BPP 0/9,MMHWKGSU placenta gr 3,AFI 12 cm,FHR 126 bpm,EFW 2822 g 51%,FL 1.5%

## 2022-12-13 NOTE — Progress Notes (Signed)
HIGH-RISK PREGNANCY VISIT Patient name: Jade Taylor MRN [id number]  Date of birth: 1991/01/21 Chief Complaint:   Routine Prenatal Visit  History of Present Illness:   Jade Taylor is a 32 y.o. [id number] female at [redacted]w[redacted]d with an Estimated Date of Delivery: 01/10/23 being seen today for ongoing management of a high-risk pregnancy complicated by history of IUFD x 2, borderline hypertension.    Today she reports no complaints. Contractions: Not present. Vag. Bleeding: None.  Movement: Present. denies leaking of fluid.      07/01/2022   10:40 AM 02/20/2022    2:32 PM 07/03/2020    2:52 PM 07/10/2016    2:59 PM  Depression screen PHQ 2/9  Decreased Interest 0 0 0 0  Down, Depressed, Hopeless 0 0 0 0  PHQ - 2 Score 0 0 0 0  Altered sleeping 0 2 1 1   Tired, decreased energy 1 1 1  0  Change in appetite 0 0 0 0  Feeling bad or failure about yourself  0 0 0 0  Trouble concentrating 0 2 1 0  Moving slowly or fidgety/restless 0 1 0 0  Suicidal thoughts 0 0 0 0  PHQ-9 Score 1 6 3 1   Difficult doing work/chores   Not difficult at all         07/01/2022   10:40 AM 02/20/2022    2:34 PM 07/03/2020    2:53 PM  GAD 7 : Generalized Anxiety Score  Nervous, Anxious, on Edge 0 1 1  Control/stop worrying 0 1 0  Worry too much - different things 0 1 0  Trouble relaxing 0 1 0  Restless 0 1 1  Easily annoyed or irritable 0 0 1  Afraid - awful might happen 0 1 1  Total GAD 7 Score 0 6 4  Anxiety Difficulty   Not difficult at all     Review of Systems:   Pertinent items are noted in HPI Denies abnormal vaginal discharge w/ itching/odor/irritation, headaches, visual changes, shortness of breath, chest pain, abdominal pain, severe nausea/vomiting, or problems with urination or bowel movements unless otherwise stated above. Pertinent History Reviewed:  Reviewed past medical,surgical, social, obstetrical and family history.  Reviewed problem list, medications and allergies. Physical  Assessment:   Vitals:   12/13/22 1052 12/13/22 1100  BP: (!) 144/90 131/81  Pulse: 96 99  Weight: 265 lb (120.2 kg)   Body mass index is 48.47 kg/m.           Physical Examination:   General appearance: alert, well appearing, and in no distress  Mental status: alert, oriented to person, place, and time  Skin: warm & dry   Extremities:      Cardiovascular: normal heart rate noted  Respiratory: normal respiratory effort, no distress  Abdomen: gravid, soft, non-tender  Pelvic: Cervical exam deferred         Fetal Status:     Movement: Present    Fetal Surveillance Testing today: BPP 8/8   Chaperone: Celene Squibb    No results found for this or any previous visit (from the past 24 hour(s)).  Assessment & Plan:  High-risk pregnancy: [id number] at [redacted]w[redacted]d with an Estimated Date of Delivery: 01/10/23      ICD-10-CM   1. Supervision of other high risk pregnancies, third trimester  O09.893 Strep Gp B NAA+Rflx    Cervicovaginal ancillary only( [place])    2. [redacted] weeks gestation of pregnancy  Z3A.36 Strep Gp B NAA+Rflx  Cervicovaginal ancillary only( [place])    3. History of cesarean delivery x 2  Z98.891     4. History of IUFD  Z87.59        Meds: No orders of the defined types were placed in this encounter.   Orders:  Orders Placed This Encounter  Procedures   Strep Gp B NAA+Rflx     Labs/procedures today: U/S  Treatment Plan:  NST Tuesday section 1/27 LHE + BTL    Follow-up: No follow-ups on file.   Future Appointments  Date Time Provider [place]  12/17/2022 10:30 AM [facility] NURSE [facility]  12/19/2022  9:00 AM [facility] PAT 1 [facility] None    Orders Placed This Encounter  Procedures   Strep Gp B NAA+Rflx   Florian Buff  Attending Physician for the [facility] 12/13/2022 11:39 AM

## 2022-12-16 LAB — CERVICOVAGINAL ANCILLARY ONLY
Chlamydia: NEGATIVE
Comment: NEGATIVE
Comment: NORMAL
Neisseria Gonorrhea: NEGATIVE

## 2022-12-16 LAB — STREP GP B NAA+RFLX: Strep Gp B NAA+Rflx: POSITIVE — AB

## 2022-12-16 LAB — STREP GP B SUSCEPTIBILITY

## 2022-12-17 ENCOUNTER — Ambulatory Visit (INDEPENDENT_AMBULATORY_CARE_PROVIDER_SITE_OTHER): Payer: Medicaid Other | Admitting: *Deleted

## 2022-12-17 VITALS — BP 115/75 | HR 92 | Wt 266.4 lb

## 2022-12-17 DIAGNOSIS — Z8759 Personal history of other complications of pregnancy, childbirth and the puerperium: Secondary | ICD-10-CM | POA: Diagnosis not present

## 2022-12-17 DIAGNOSIS — Z3A36 36 weeks gestation of pregnancy: Secondary | ICD-10-CM

## 2022-12-17 DIAGNOSIS — O0990 Supervision of high risk pregnancy, unspecified, unspecified trimester: Secondary | ICD-10-CM

## 2022-12-17 DIAGNOSIS — O0993 Supervision of high risk pregnancy, unspecified, third trimester: Secondary | ICD-10-CM | POA: Diagnosis not present

## 2022-12-17 NOTE — Progress Notes (Signed)
   NURSE VISIT- NST  SUBJECTIVE:  Jade Taylor is a 32 y.o. [phone] female at [redacted]w[redacted]d, here for a NST for pregnancy complicated by hx IUFD x2.  She reports active fetal movement, contractions: none, vaginal bleeding: none, membranes: intact.   OBJECTIVE:  BP 115/75   Pulse 92   Wt 266 lb 6.4 oz (120.8 kg)   LMP 04/13/2022 (Approximate)   BMI 48.73 kg/m   Appears well, no apparent distress  No results found for this or any previous visit (from the past 24 hour(s)).  NST: FHR baseline 135 bpm, Variability: moderate, Accelerations:present, Decelerations:  Absent= Cat 1/reactive Toco: none   ASSESSMENT: [id number] at [redacted]w[redacted]d with hx IUFD x2 NST reactive  PLAN: EFM strip reviewed by Dr. Elonda Husky   Recommendations: keep next appointment as scheduled    [name]  12/17/2022 12:18 PM

## 2022-12-19 ENCOUNTER — Other Ambulatory Visit (HOSPITAL_COMMUNITY)
Admission: RE | Admit: 2022-12-19 | Discharge: 2022-12-19 | Disposition: A | Discharge status: Home / Self Care | Payer: Medicaid Other | Source: Ambulatory Visit | Attending: Obstetrics & Gynecology | Admitting: Obstetrics & Gynecology

## 2022-12-20 ENCOUNTER — Other Ambulatory Visit: Payer: Medicaid Other

## 2022-12-20 ENCOUNTER — Encounter: Payer: Medicaid Other | Admitting: Obstetrics & Gynecology

## 2022-12-21 ENCOUNTER — Encounter (HOSPITAL_COMMUNITY)
Admission: RE | Disposition: A | Discharge status: Home / Self Care | Payer: Self-pay | Source: Home / Self Care | Attending: Obstetrics & Gynecology

## 2022-12-21 ENCOUNTER — Other Ambulatory Visit: Payer: Self-pay

## 2022-12-21 ENCOUNTER — Inpatient Hospital Stay (HOSPITAL_COMMUNITY): Payer: Medicaid Other | Admitting: Anesthesiology

## 2022-12-21 ENCOUNTER — Inpatient Hospital Stay (HOSPITAL_COMMUNITY)
Admission: RE | Admit: 2022-12-21 | Discharge: 2022-12-23 | DRG: 785 | Disposition: A | Discharge status: Home / Self Care | Payer: Medicaid Other | Attending: Obstetrics & Gynecology | Admitting: Obstetrics & Gynecology

## 2022-12-21 ENCOUNTER — Encounter (HOSPITAL_COMMUNITY): Payer: Self-pay | Admitting: Obstetrics & Gynecology

## 2022-12-21 DIAGNOSIS — Z91048 Other nonmedicinal substance allergy status: Secondary | ICD-10-CM | POA: Diagnosis present

## 2022-12-21 DIAGNOSIS — Z302 Encounter for sterilization: Secondary | ICD-10-CM

## 2022-12-21 DIAGNOSIS — O99824 Streptococcus B carrier state complicating childbirth: Secondary | ICD-10-CM | POA: Diagnosis present

## 2022-12-21 DIAGNOSIS — O0990 Supervision of high risk pregnancy, unspecified, unspecified trimester: Secondary | ICD-10-CM

## 2022-12-21 DIAGNOSIS — O34211 Maternal care for low transverse scar from previous cesarean delivery: Secondary | ICD-10-CM

## 2022-12-21 DIAGNOSIS — O99344 Other mental disorders complicating childbirth: Secondary | ICD-10-CM | POA: Diagnosis present

## 2022-12-21 DIAGNOSIS — J45909 Unspecified asthma, uncomplicated: Secondary | ICD-10-CM | POA: Diagnosis present

## 2022-12-21 DIAGNOSIS — O134 Gestational [pregnancy-induced] hypertension without significant proteinuria, complicating childbirth: Secondary | ICD-10-CM | POA: Diagnosis present

## 2022-12-21 DIAGNOSIS — Z148 Genetic carrier of other disease: Secondary | ICD-10-CM

## 2022-12-21 DIAGNOSIS — O9902 Anemia complicating childbirth: Secondary | ICD-10-CM | POA: Diagnosis present

## 2022-12-21 DIAGNOSIS — Z3A37 37 weeks gestation of pregnancy: Secondary | ICD-10-CM

## 2022-12-21 DIAGNOSIS — Z880 Allergy status to penicillin: Secondary | ICD-10-CM | POA: Diagnosis not present

## 2022-12-21 DIAGNOSIS — F4310 Post-traumatic stress disorder, unspecified: Secondary | ICD-10-CM | POA: Diagnosis present

## 2022-12-21 DIAGNOSIS — Z9851 Tubal ligation status: Secondary | ICD-10-CM

## 2022-12-21 DIAGNOSIS — Z87891 Personal history of nicotine dependence: Secondary | ICD-10-CM | POA: Diagnosis not present

## 2022-12-21 DIAGNOSIS — O9952 Diseases of the respiratory system complicating childbirth: Secondary | ICD-10-CM | POA: Diagnosis present

## 2022-12-21 DIAGNOSIS — Z98891 History of uterine scar from previous surgery: Secondary | ICD-10-CM

## 2022-12-21 LAB — CBC
HCT: 36.4 % (ref 36.0–46.0)
Hemoglobin: 12.1 g/dL (ref 12.0–15.0)
MCH: 28.8 pg (ref 26.0–34.0)
MCHC: 33.2 g/dL (ref 30.0–36.0)
MCV: 86.7 fL (ref 80.0–100.0)
Platelets: 192 10*3/uL (ref 150–400)
RBC: 4.2 MIL/uL (ref 3.87–5.11)
RDW: 14.5 % (ref 11.5–15.5)
WBC: 14 10*3/uL — ABNORMAL HIGH (ref 4.0–10.5)
nRBC: 0 % (ref 0.0–0.2)

## 2022-12-21 LAB — RPR: RPR Ser Ql: NONREACTIVE

## 2022-12-21 LAB — TYPE AND SCREEN
ABO/RH(D): A POS
Antibody Screen: NEGATIVE

## 2022-12-21 SURGERY — Surgical Case
Anesthesia: Spinal

## 2022-12-21 MED ORDER — ONDANSETRON HCL 4 MG/2ML IJ SOLN
INTRAMUSCULAR | Status: AC
  Filled 2022-12-21: qty 2

## 2022-12-21 MED ORDER — FENTANYL CITRATE (PF) 100 MCG/2ML IJ SOLN
INTRAMUSCULAR | Status: AC
  Filled 2022-12-21: qty 2

## 2022-12-21 MED ORDER — CLINDAMYCIN PHOSPHATE 900 MG/50ML IV SOLN
INTRAVENOUS | Status: AC
  Filled 2022-12-21: qty 50

## 2022-12-21 MED ORDER — WESTAB PLUS 27-1 MG PO TABS: 1.0000 | ORAL_TABLET | Freq: Every day | ORAL | Status: DC

## 2022-12-21 MED ORDER — MENTHOL 3 MG MT LOZG: 1.0000 | LOZENGE | OROMUCOSAL | Status: DC | PRN

## 2022-12-21 MED ORDER — OXYTOCIN-SODIUM CHLORIDE 30-0.9 UT/500ML-% IV SOLN
INTRAVENOUS | Status: DC | PRN
  Administered 2022-12-21: 300 mL via INTRAVENOUS

## 2022-12-21 MED ORDER — SCOPOLAMINE 1 MG/3DAYS TD PT72
1.0000 | MEDICATED_PATCH | TRANSDERMAL | Status: DC
  Administered 2022-12-21: 1.5 mg via TRANSDERMAL
  Filled 2022-12-21: qty 1

## 2022-12-21 MED ORDER — ALBUTEROL SULFATE (2.5 MG/3ML) 0.083% IN NEBU: 2.5000 mg | INHALATION_SOLUTION | Freq: Four times a day (QID) | RESPIRATORY_TRACT | Status: DC | PRN

## 2022-12-21 MED ORDER — PHENYLEPHRINE HCL-NACL 20-0.9 MG/250ML-% IV SOLN
INTRAVENOUS | Status: DC | PRN
  Administered 2022-12-21: 60 ug/min via INTRAVENOUS

## 2022-12-21 MED ORDER — CLINDAMYCIN PHOSPHATE 900 MG/50ML IV SOLN
900.0000 mg | INTRAVENOUS | Status: DC
  Administered 2022-12-21: 900 mg via INTRAVENOUS

## 2022-12-21 MED ORDER — ZOLPIDEM TARTRATE 5 MG PO TABS: 5.0000 mg | ORAL_TABLET | Freq: Every evening | ORAL | Status: DC | PRN

## 2022-12-21 MED ORDER — FENTANYL CITRATE (PF) 100 MCG/2ML IJ SOLN: 25.0000 ug | INTRAMUSCULAR | Status: DC | PRN

## 2022-12-21 MED ORDER — SIMETHICONE 80 MG PO CHEW: 80.0000 mg | CHEWABLE_TABLET | ORAL | Status: DC | PRN

## 2022-12-21 MED ORDER — POVIDONE-IODINE 10 % EX SWAB: 2.0000 | Freq: Once | CUTANEOUS | Status: DC

## 2022-12-21 MED ORDER — BUPIVACAINE IN DEXTROSE 0.75-8.25 % IT SOLN
INTRATHECAL | Status: DC | PRN
  Administered 2022-12-21: 1.6 mL via INTRATHECAL

## 2022-12-21 MED ORDER — MORPHINE SULFATE (PF) 0.5 MG/ML IJ SOLN
INTRAMUSCULAR | Status: AC
  Filled 2022-12-21: qty 10

## 2022-12-21 MED ORDER — DIPHENHYDRAMINE HCL 25 MG PO CAPS
25.0000 mg | ORAL_CAPSULE | Freq: Four times a day (QID) | ORAL | Status: DC | PRN
  Administered 2022-12-21: 25 mg via ORAL

## 2022-12-21 MED ORDER — BUPIVACAINE LIPOSOME 1.3 % IJ SUSP
INTRAMUSCULAR | Status: DC | PRN
  Administered 2022-12-21: 20 mL

## 2022-12-21 MED ORDER — ACETAMINOPHEN 500 MG PO TABS
1000.0000 mg | ORAL_TABLET | Freq: Four times a day (QID) | ORAL | Status: DC
  Administered 2022-12-21 – 2022-12-23 (×7): 1000 mg via ORAL
  Filled 2022-12-21 (×8): qty 2

## 2022-12-21 MED ORDER — SENNOSIDES-DOCUSATE SODIUM 8.6-50 MG PO TABS
2.0000 | ORAL_TABLET | Freq: Every day | ORAL | Status: DC
  Administered 2022-12-22 – 2022-12-23 (×2): 2 via ORAL
  Filled 2022-12-21 (×2): qty 2

## 2022-12-21 MED ORDER — GABAPENTIN 100 MG PO CAPS
200.0000 mg | ORAL_CAPSULE | Freq: Every day | ORAL | Status: DC
  Administered 2022-12-21 – 2022-12-22 (×2): 200 mg via ORAL
  Filled 2022-12-21 (×2): qty 2

## 2022-12-21 MED ORDER — DIBUCAINE (PERIANAL) 1 % EX OINT: 1.0000 | TOPICAL_OINTMENT | CUTANEOUS | Status: DC | PRN

## 2022-12-21 MED ORDER — ENOXAPARIN SODIUM 60 MG/0.6ML IJ SOSY
60.0000 mg | PREFILLED_SYRINGE | INTRAMUSCULAR | Status: DC
  Administered 2022-12-22 – 2022-12-23 (×2): 60 mg via SUBCUTANEOUS
  Filled 2022-12-21 (×2): qty 0.6

## 2022-12-21 MED ORDER — SODIUM CHLORIDE 0.9 % IR SOLN
Status: DC | PRN
  Administered 2022-12-21: 1

## 2022-12-21 MED ORDER — ACETAMINOPHEN 160 MG/5ML PO SOLN: 1000.0000 mg | Freq: Once | ORAL | Status: DC | PRN

## 2022-12-21 MED ORDER — IBUPROFEN 600 MG PO TABS
600.0000 mg | ORAL_TABLET | Freq: Four times a day (QID) | ORAL | Status: DC
  Administered 2022-12-22 – 2022-12-23 (×4): 600 mg via ORAL
  Filled 2022-12-21 (×4): qty 1

## 2022-12-21 MED ORDER — GENTAMICIN SULFATE 40 MG/ML IJ SOLN
5.0000 mg/kg | INTRAVENOUS | Status: DC
  Administered 2022-12-21: 390 mg via INTRAVENOUS
  Filled 2022-12-21: qty 9.75

## 2022-12-21 MED ORDER — GABAPENTIN 300 MG PO CAPS
300.0000 mg | ORAL_CAPSULE | ORAL | Status: AC
  Administered 2022-12-21: 300 mg via ORAL

## 2022-12-21 MED ORDER — ONDANSETRON HCL 4 MG/2ML IJ SOLN
INTRAMUSCULAR | Status: DC | PRN
  Administered 2022-12-21: 4 mg via INTRAVENOUS

## 2022-12-21 MED ORDER — LACTATED RINGERS IV SOLN: INTRAVENOUS | Status: DC

## 2022-12-21 MED ORDER — PHENYLEPHRINE HCL (PRESSORS) 10 MG/ML IV SOLN
INTRAVENOUS | Status: DC | PRN
  Administered 2022-12-21: 40 ug via INTRAVENOUS

## 2022-12-21 MED ORDER — MORPHINE SULFATE (PF) 0.5 MG/ML IJ SOLN
INTRAMUSCULAR | Status: DC | PRN
  Administered 2022-12-21: 150 ug via INTRATHECAL

## 2022-12-21 MED ORDER — KETOROLAC TROMETHAMINE 30 MG/ML IJ SOLN
INTRAMUSCULAR | Status: AC
  Filled 2022-12-21: qty 1

## 2022-12-21 MED ORDER — OXYTOCIN-SODIUM CHLORIDE 30-0.9 UT/500ML-% IV SOLN
2.5000 [IU]/h | INTRAVENOUS | Status: AC
  Administered 2022-12-21: 2.5 [IU]/h via INTRAVENOUS
  Filled 2022-12-21: qty 500

## 2022-12-21 MED ORDER — KETOROLAC TROMETHAMINE 30 MG/ML IJ SOLN
30.0000 mg | Freq: Four times a day (QID) | INTRAMUSCULAR | Status: AC
  Administered 2022-12-21 – 2022-12-22 (×4): 30 mg via INTRAVENOUS
  Filled 2022-12-21 (×3): qty 1

## 2022-12-21 MED ORDER — PROPOFOL 10 MG/ML IV BOLUS
INTRAVENOUS | Status: AC
  Filled 2022-12-21: qty 20

## 2022-12-21 MED ORDER — DEXMEDETOMIDINE HCL IN NACL 80 MCG/20ML IV SOLN
INTRAVENOUS | Status: AC
  Filled 2022-12-21: qty 20

## 2022-12-21 MED ORDER — FENTANYL CITRATE (PF) 100 MCG/2ML IJ SOLN
INTRAMUSCULAR | Status: DC | PRN
  Administered 2022-12-21: 15 ug via INTRATHECAL

## 2022-12-21 MED ORDER — SOD CITRATE-CITRIC ACID 500-334 MG/5ML PO SOLN: 30.0000 mL | ORAL | Status: DC

## 2022-12-21 MED ORDER — COCONUT OIL OIL: 1.0000 | TOPICAL_OIL | Status: DC | PRN

## 2022-12-21 MED ORDER — STERILE WATER FOR IRRIGATION IR SOLN
Status: DC | PRN
  Administered 2022-12-21: 1

## 2022-12-21 MED ORDER — SIMETHICONE 80 MG PO CHEW
80.0000 mg | CHEWABLE_TABLET | Freq: Three times a day (TID) | ORAL | Status: DC
  Administered 2022-12-21 – 2022-12-23 (×6): 80 mg via ORAL
  Filled 2022-12-21 (×6): qty 1

## 2022-12-21 MED ORDER — METHYLERGONOVINE MALEATE 0.2 MG/ML IJ SOLN
INTRAMUSCULAR | Status: DC | PRN
  Administered 2022-12-21: .2 mg via INTRAMUSCULAR

## 2022-12-21 MED ORDER — PRENATAL MULTIVITAMIN CH
1.0000 | ORAL_TABLET | Freq: Every day | ORAL | Status: DC
  Administered 2022-12-21 – 2022-12-22 (×2): 1 via ORAL
  Filled 2022-12-21 (×2): qty 1

## 2022-12-21 MED ORDER — WITCH HAZEL-GLYCERIN EX PADS: 1.0000 | MEDICATED_PAD | CUTANEOUS | Status: DC | PRN

## 2022-12-21 MED ORDER — OXYCODONE HCL 5 MG PO TABS
5.0000 mg | ORAL_TABLET | ORAL | Status: DC | PRN
  Administered 2022-12-22 – 2022-12-23 (×3): 10 mg via ORAL
  Filled 2022-12-21 (×3): qty 2

## 2022-12-21 MED ORDER — DEXAMETHASONE SODIUM PHOSPHATE 4 MG/ML IJ SOLN
INTRAMUSCULAR | Status: AC
  Filled 2022-12-21: qty 2

## 2022-12-21 MED ORDER — DEXAMETHASONE SODIUM PHOSPHATE 4 MG/ML IJ SOLN
INTRAMUSCULAR | Status: DC | PRN
  Administered 2022-12-21: 8 mg via INTRAVENOUS

## 2022-12-21 MED ORDER — METHYLERGONOVINE MALEATE 0.2 MG/ML IJ SOLN
INTRAMUSCULAR | Status: AC
  Filled 2022-12-21: qty 1

## 2022-12-21 MED ORDER — SOD CITRATE-CITRIC ACID 500-334 MG/5ML PO SOLN
ORAL | Status: AC
  Filled 2022-12-21: qty 30

## 2022-12-21 MED ORDER — DIPHENHYDRAMINE HCL 25 MG PO CAPS
ORAL_CAPSULE | ORAL | Status: AC
  Filled 2022-12-21: qty 1

## 2022-12-21 MED ORDER — ENOXAPARIN SODIUM 40 MG/0.4ML IJ SOSY: 40.0000 mg | PREFILLED_SYRINGE | INTRAMUSCULAR | Status: DC

## 2022-12-21 MED ORDER — BUPIVACAINE LIPOSOME 1.3 % IJ SUSP
INTRAMUSCULAR | Status: AC
  Filled 2022-12-21: qty 20

## 2022-12-21 MED ORDER — ACETAMINOPHEN 10 MG/ML IV SOLN
1000.0000 mg | Freq: Once | INTRAVENOUS | Status: DC | PRN
  Administered 2022-12-21: 1000 mg via INTRAVENOUS

## 2022-12-21 MED ORDER — GABAPENTIN 300 MG PO CAPS
ORAL_CAPSULE | ORAL | Status: AC
  Filled 2022-12-21: qty 1

## 2022-12-21 MED ORDER — HYDROXYZINE HCL 25 MG PO TABS: 25.0000 mg | ORAL_TABLET | Freq: Three times a day (TID) | ORAL | Status: DC | PRN

## 2022-12-21 MED ORDER — MAGNESIUM HYDROXIDE 400 MG/5ML PO SUSP: 30.0000 mL | ORAL | Status: DC | PRN

## 2022-12-21 MED ORDER — ACETAMINOPHEN 500 MG PO TABS: 1000.0000 mg | ORAL_TABLET | Freq: Once | ORAL | Status: DC | PRN

## 2022-12-21 SURGICAL SUPPLY — 39 items
CHLORAPREP W/TINT 26 (MISCELLANEOUS) ×1 IMPLANT
CLAMP UMBILICAL CORD (MISCELLANEOUS) ×1 IMPLANT
CLOTH BEACON ORANGE TIMEOUT ST (SAFETY) ×1 IMPLANT
DERMABOND ADVANCED .7 DNX12 (GAUZE/BANDAGES/DRESSINGS) ×2 IMPLANT
DRSG OPSITE POSTOP 4X10 (GAUZE/BANDAGES/DRESSINGS) ×1 IMPLANT
ELECT REM PT RETURN 9FT ADLT (ELECTROSURGICAL) ×1
ELECTRODE REM PT RTRN 9FT ADLT (ELECTROSURGICAL) ×1 IMPLANT
EXTRACTOR VACUUM BELL CUP MITY (SUCTIONS) IMPLANT
GLOVE BIOGEL PI IND STRL 7.0 (GLOVE) ×1 IMPLANT
GLOVE BIOGEL PI IND STRL 8 (GLOVE) ×1 IMPLANT
GLOVE ECLIPSE 8.0 STRL XLNG CF (GLOVE) ×1 IMPLANT
GOWN STRL REUS W/TWL LRG LVL3 (GOWN DISPOSABLE) ×2 IMPLANT
HEMOSTAT ARISTA ABSORB 3G PWDR (HEMOSTASIS) IMPLANT
KIT ABG SYR 3ML LUER SLIP (SYRINGE) ×1 IMPLANT
MAT PREVALON FULL STRYKER (MISCELLANEOUS) IMPLANT
NDL HYPO 25X5/8 SAFETYGLIDE (NEEDLE) ×1 IMPLANT
NEEDLE HYPO 25X5/8 SAFETYGLIDE (NEEDLE) ×1 IMPLANT
NS IRRIG 1000ML POUR BTL (IV SOLUTION) ×1 IMPLANT
PACK C SECTION WH (CUSTOM PROCEDURE TRAY) ×1 IMPLANT
PAD OB MATERNITY 4.3X12.25 (PERSONAL CARE ITEMS) ×1 IMPLANT
PENCIL SMOKE EVAC W/HOLSTER (ELECTROSURGICAL) IMPLANT
RETAINER VISCERAL (MISCELLANEOUS) IMPLANT
RETRACTOR TRAXI PANNICULUS (MISCELLANEOUS) IMPLANT
RTRCTR C-SECT PINK 25CM LRG (MISCELLANEOUS) IMPLANT
SUT CHROMIC 0 CT 1 (SUTURE) ×1 IMPLANT
SUT MNCRL 0 VIOLET CTX 36 (SUTURE) ×2 IMPLANT
SUT MON AB-0 CT1 36 (SUTURE) IMPLANT
SUT MONOCRYL 0 CTX 36 (SUTURE) ×2
SUT PLAIN 2 0 (SUTURE) ×1
SUT PLAIN 2 0 XLH (SUTURE) IMPLANT
SUT PLAIN ABS 2-0 54XMFL TIE (SUTURE) IMPLANT
SUT PLAIN ABS 2-0 CT1 27XMFL (SUTURE) IMPLANT
SUT VIC AB 0 CTX 36 (SUTURE) ×1
SUT VIC AB 0 CTX36XBRD ANBCTRL (SUTURE) ×1 IMPLANT
SUT VIC AB 4-0 KS 27 (SUTURE) IMPLANT
SYR 20CC LL (SYRINGE) ×2 IMPLANT
TOWEL OR 17X24 6PK STRL BLUE (TOWEL DISPOSABLE) ×1 IMPLANT
TRAY FOLEY W/BAG SLVR 14FR LF (SET/KITS/TRAYS/PACK) IMPLANT
WATER STERILE IRR 1000ML POUR (IV SOLUTION) ×1 IMPLANT

## 2022-12-21 NOTE — Anesthesia Procedure Notes (Addendum)
Spinal  Patient location during procedure: OR Start time: 12/21/2022 9:38 AM End time: 12/21/2022 9:42 AM Reason for block: surgical anesthesia Staffing Performed: anesthesiologist  Anesthesiologist: Oleta Mouse, MD Performed by: Oleta Mouse, MD Authorized by: Oleta Mouse, MD   Preanesthetic Checklist Completed: patient identified, IV checked, risks and benefits discussed, surgical consent, monitors and equipment checked, pre-op evaluation and timeout performed Spinal Block Patient position: sitting Prep: DuraPrep Patient monitoring: heart rate, cardiac monitor, continuous pulse ox and blood pressure Approach: midline Location: L3-4 Injection technique: single-shot Needle Needle type: Pencan  Needle gauge: 24 G Needle length: 9 cm Assessment Sensory level: T6 Events: CSF return

## 2022-12-21 NOTE — Anesthesia Postprocedure Evaluation (Signed)
Anesthesia Post Note  Patient: Jade Taylor  Procedure(s) Performed: REPEAT CESAREAN SECTION WITH BILATERAL TUBAL LIGATION     Patient location during evaluation: PACU Anesthesia Type: Spinal Level of consciousness: awake and alert Pain management: pain level controlled Vital Signs Assessment: post-procedure vital signs reviewed and stable Respiratory status: spontaneous breathing, nonlabored ventilation and respiratory function stable Cardiovascular status: stable and blood pressure returned to baseline Postop Assessment: no apparent nausea or vomiting Anesthetic complications: no   No notable events documented.  Last Vitals:  Vitals:   12/21/22 1320 12/21/22 1520  BP: 123/81 118/79  Pulse: 79 82  Resp: 16 18  Temp: 36.6 C 36.7 C  SpO2: 99% 99%    Last Pain:  Vitals:   12/21/22 1542  TempSrc:   PainSc: 0-No pain   Pain Goal:                   Shameer Molstad

## 2022-12-21 NOTE — H&P (Addendum)
Obstetric Preoperative History and Physical  Jade Taylor is a 32 y.o. [id number] with IUP at [redacted]w[redacted]d presenting for scheduled cesarean section + bilateral tubal sterilization, any method.  Reports good fetal movement, no bleeding, no contractions, no leaking of fluid.  No acute preoperative concerns.    IUFD x 2 @ 24 and 28 weeks, one of which was due to multiple fetal anomalies Dr Nelda Marseille discussed with MFM who recommended delivery at 67 weeks as a result of her past history  She has also had episodic BP elevations throughout the pregnancy, so labeled gestational hypertension as well, in addition to the IUFD history  Cesarean Section Indication:  elective repeat  Prenatal Course Source of Care: [facility]  with onset of care at 10 weeks Pregnancy complications or risks: Patient Active Problem List   Diagnosis Date Noted   S/P cesarean section 12/21/2022   Carrier of spinal muscular atrophy 07/22/2022   Supervision of high risk pregnancy, antepartum 07/01/2022   History of cesarean delivery 07/01/2022   Calculus of gallbladder with acute cholecystitis without obstruction    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   Prenatal labs and studies: ABO, Rh: --/--/A POS (01/27 6160) Antibody: NEG (01/27 0640) Rubella: 1.61 (08/07 1146) RPR: Non Reactive (11/21 0825)  HBsAg: Negative (08/07 1146)  HIV: Non Reactive (11/21 0825)  GBS:--/Positive (01/19 1400) 1 hr Glucola  nml Genetic screening normal Anatomy US normal  Prenatal Transfer Tool  Maternal Diabetes: No Genetic Screening: Normal Maternal Ultrasounds/Referrals: Normal Fetal Ultrasounds or other Referrals:  None Maternal Substance Abuse:   No Significant Maternal Medications:  None Significant Maternal Lab Results: Group B Strep positive  Past Medical History:  Diagnosis Date   Asthma    Chronic pain syndrome    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 Luna Glasgow   PTSD (post-traumatic stress disorder)     Past Surgical History:  Procedure Laterality Date   CARPAL TUNNEL RELEASE     CESAREAN SECTION N/A 01/23/2017   Procedure: CESAREAN SECTION;  Surgeon: Lavonia Drafts, MD;  Location: [facility];  Service: Obstetrics;  Laterality: N/A;   CHOLECYSTECTOMY N/A 08/17/2018   Procedure: LAPAROSCOPIC CHOLECYSTECTOMY;  Surgeon: Aviva Signs, MD;  Location: [facility];  Service: General;  Laterality: N/A;   WISDOM TOOTH EXTRACTION      OB History  Gravida Para Term Preterm AB Living  6 3 1 2 2 1   SAB IAB Ectopic Multiple Live Births  1 1   0 1    # Outcome Date GA Lbr Len/2nd Weight Sex Delivery Anes PTL Lv  6 Current           5 Term 01/23/17 [redacted]w[redacted]d  3370 g M CS-LTranv EPI N LIV     Complications: Failure to Progress in First Stage  4 Preterm 09/13/15 [redacted]w[redacted]d   F Vag-Spont  N FD     Birth Comments: unclear etiology, small 1cm placental bleed on pathology     Complications: IUFD (intrauterine fetal death), Antepartum placental abruption  3 SAB 02/24/15 [redacted]w[redacted]d         2 IAB 08/25/14          1 Preterm 01/26/14  [redacted]w[redacted]d   F   N FD     Birth Comments: fetus 20wk size, cytotec IOL, true knot     Complications: IUFD (intrauterine fetal death), True knot in cord    Social History   Socioeconomic History   Marital status: Single    Spouse name: Not on file   Number of children: Not on file   Years of education: Not on file   Highest education level: Not on file  Occupational History   Not on file  Tobacco Use   Smoking status: Former    Packs/day: 0.50    Years: 0.50    Total pack years: 0.25    Types: Cigarettes   Smokeless tobacco: Never  Vaping Use   Vaping Use: Former  Substance  and Sexual Activity   Alcohol use: Yes    Comment: occ   Drug use: Not Currently    Types: Marijuana    Comment: prior to pregnancy   Sexual activity: Yes    Birth control/protection: None  Other Topics Concern   Not on file  Social History Narrative   Not on file   Social Determinants of Health   Financial Resource Strain: Low Risk  (07/01/2022)   Overall Financial Resource Strain (CARDIA)    Difficulty of Paying Living Expenses: Not very hard  Food Insecurity: No Food Insecurity (12/21/2022)   Hunger Vital Sign    Worried About Running Out of Food in the Last Year: Never true    Ran Out of Food in the Last Year: Never true  Transportation Needs: No Transportation Needs (12/21/2022)   PRAPARE - Administrator, Civil Service (Medical): No    Lack of Transportation (Non-Medical): No  Physical Activity: Sufficiently Active (07/01/2022)   Exercise Vital Sign    Days of Exercise per Week: 5 days    Minutes of Exercise per Session: 30 min  Stress: No Stress Concern Present (07/01/2022)   [employer] of Occupational Health - Occupational Stress Questionnaire    Feeling of Stress : Not at all  Social Connections: Moderately Isolated (07/01/2022)   Social Connection and Isolation Panel [NHANES]    Frequency of Communication with Friends and Family: More than three times a week    Frequency of Social Gatherings with Friends and Family: Three times a week    Attends Religious Services: 1 to 4 times per year    Active Member of Clubs or Organizations: No    Attends Banker Meetings: Never    Marital Status: Never married    Family History  Problem Relation Age of Onset   Bipolar disorder Mother    Schizophrenia Mother    Diabetes Mother    Hypertension Mother    Kidney disease Mother    Stroke Father    Heart disease Maternal Grandmother    Heart disease Paternal Grandfather     Medications Prior to Admission  Medication Sig Dispense Refill Last Dose    albuterol (VENTOLIN HFA) 108 (90 Base) MCG/ACT inhaler Inhale 2 puffs into the lungs every 6 (six) hours as needed for wheezing or shortness of breath. 8 g 2 Past Month   aspirin EC 81 MG tablet Take 1 tablet (81 mg total) by mouth daily. Swallow whole. (Patient taking differently: Take 81 mg by mouth daily at 6 PM. Swallow whole.) 90 tablet 3 12/20/2022   hydrOXYzine (ATARAX) 25 MG tablet Take 1 tablet (25 mg total) by mouth 3 (three) times daily as needed for  anxiety. 30 tablet 0 Past Month   Prenatal Vit-Fe Fumarate-FA (WESTAB PLUS) 27-1 MG TABS Take 1 tablet by mouth daily. (Patient taking differently: Take 1 tablet by mouth daily at 6 PM.) 100 tablet 11 12/20/2022    Allergies  Allergen Reactions   Metronidazole Hives, Shortness Of Breath, Rash and Other (See Comments)    Rash, Burning sensation- was hospitalized   Penicillins Shortness Of Breath, Itching and Rash    Has patient had a PCN reaction causing immediate rash, facial/tongue/throat swelling, SOB or lightheadedness with hypotension: No Has patient had a PCN reaction causing severe rash involving mucus membranes or skin necrosis: No Has patient had a PCN reaction that required hospitalization Yes Has patient had a PCN reaction occurring within the last 10 years: No If all of the above answers are "NO", then may proceed with Cephalosporin use.    Amoxicillin Hives    Has patient had a PCN reaction causing immediate rash, facial/tongue/throat swelling, SOB or lightheadedness with hypotension: Yes Has patient had a PCN reaction causing severe rash involving mucus membranes or skin necrosis: No Has patient had a PCN reaction that required hospitalization No Has patient had a PCN reaction occurring within the last 10 years: No If all of the above answers are "NO", then may proceed with Cephalosporin use.    Tape Rash    Surgical tape, bandaids    Review of Systems: Pertinent items noted in HPI and remainder of comprehensive ROS  otherwise negative.  Physical Exam: BP 123/82 (BP Location: Left Arm)   Pulse 82   Temp 98.1 F (36.7 C) (Oral)   Resp 16   Ht 5\' 2"  (1.575 m)   Wt 120.1 kg   LMP 04/13/2022 (Approximate)   SpO2 92%   BMI 48.41 kg/m  FHR by Doppler: 127 bpm CONSTITUTIONAL: Well-developed, well-nourished female in no acute distress.  HENT:  Normocephalic, atraumatic, External right and left ear normal. Oropharynx is clear and moist EYES: Conjunctivae and EOM are normal. Pupils are equal, round, and reactive to light. No scleral icterus.  NECK: Normal range of motion, supple, no masses SKIN: Skin is warm and dry. No rash noted. Not diaphoretic. No erythema. No pallor. NEUROLOGIC: Alert and oriented to person, place, and time. Normal reflexes, muscle tone coordination. No cranial nerve deficit noted. PSYCHIATRIC: Normal mood and affect. Normal behavior. Normal judgment and thought content. CARDIOVASCULAR: Normal heart rate noted, regular rhythm RESPIRATORY: Effort and breath sounds normal, no problems with respiration noted ABDOMEN: Soft, nontender, nondistended, gravid. Well-healed Pfannenstiel incision. PELVIC: Deferred MUSCULOSKELETAL: Normal range of motion. No edema and no tenderness. 2+ distal pulses.  Pertinent Labs/Studies:   Results for orders placed or performed during the hospital encounter of 12/21/22 (from the past 72 hour(s))  CBC     Status: Abnormal   Collection Time: 12/21/22  6:40 AM  Result Value Ref Range   WBC 14.0 (H) 4.0 - 10.5 K/uL   RBC 4.20 3.87 - 5.11 MIL/uL   Hemoglobin 12.1 12.0 - 15.0 g/dL   HCT 12/23/22 95.6 - 38.7 %   MCV 86.7 80.0 - 100.0 fL   MCH 28.8 26.0 - 34.0 pg   MCHC 33.2 30.0 - 36.0 g/dL   RDW 56.4 33.2 - 95.1 %   Platelets 192 150 - 400 K/uL   nRBC 0.0 0.0 - 0.2 %    Comment: Performed at [facility], [address], [place], [place]  Type and screen [facility]  Status: None   Collection Time: 12/21/22  6:40 AM   Result Value Ref Range   ABO/RH(D) A POS    Antibody Screen NEG    Sample Expiration      12/24/2022,2359 Performed at [facility], [place] [address] [place], [place] [address]   [redacted]w[redacted]d Estimated Date of Delivery: 01/10/23  IUFD x 2 C section x 2 Desires sterilization Gestational hypertension  Assessment and Plan: Jade Taylor is a 32 y.o. [id number] at [redacted]w[redacted]d being admitted for scheduled cesarean section. The risks of surgery were discussed with the patient including but were not limited to: bleeding which may require transfusion or reoperation; infection which may require antibiotics; injury to bowel, bladder, ureters or other surrounding organs; injury to the fetus; need for additional procedures including hysterectomy in the event of a life-threatening hemorrhage; formation of adhesions; placental abnormalities wth subsequent pregnancies; incisional problems; thromboembolic phenomenon and other postoperative/anesthesia complications. The patient concurred with the proposed plan, giving informed written consent for the procedure. Patient has been NPO since last night she will remain NPO for procedure. Anesthesia and OR aware. Preoperative prophylactic antibiotics and SCDs ordered on call to the OR. To OR when ready.    Shelda Pal, DO FMOB Fellow, Faculty practice [place] for [place] 12/21/22  8:57 AM  Attestation of Attending Supervision of Advanced Practitioner (CNM/NP/PA): Evaluation and management procedures were performed by the Advanced Practitioner under my supervision and collaboration.  I have reviewed the Advanced Practitioner's note and chart, and I agree with the management and plan.  Jacelyn Grip MD Attending Physician for the Center for [facility] 12/21/2022 9:13 AM

## 2022-12-21 NOTE — Discharge Summary (Signed)
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 yo [id number] at [redacted]w[redacted]d was admitted to the hospital 12/21/2022 for scheduled cesarean section with the following indication:Elective Repeat.Delivery details are as follows:  Membrane Rupture Time/Date: 10:04 AM ,12/21/2022   Delivery Method:C-Section, Low Transverse  Details of operation can be found in separate operative note.  Patient had a postpartum course complicated by***.  She is ambulating, tolerating a regular diet, passing flatus, and urinating well. Patient is discharged home in stable condition on  12/21/22        Newborn Data: Birth date:12/21/2022  Birth time:10:05 AM  Gender:Female  Living status:Living  Apgars:9 ,9  Weight:2880 g     Magnesium Sulfate received: {Mag received:30440022} BMZ received: No Rhophylac:N/A MMR:N/A T-DaP:Given prenatally Flu: Yes Transfusion:{Transfusion received:30440034}  Physical exam  Vitals:   12/21/22 0625 12/21/22 0636  BP:  123/82  Pulse:  82  Resp:  16  Temp:  98.1 F (36.7 C)  TempSrc:  Oral  SpO2:  92%   Weight: 120.1 kg   Height: 5\' 2"  (1.575 m)    General: {Exam; general:21111117} Lochia: {Desc; appropriate/inappropriate:30686::"appropriate"} Uterine Fundus: {Desc; firm/soft:30687} Incision: {Exam; incision:21111123} DVT Evaluation: {Exam; YIR:4854627} Labs: Lab Results  Component Value Date   WBC 14.0 (H) 12/21/2022   HGB 12.1 12/21/2022   HCT 36.4 12/21/2022   MCV 86.7 12/21/2022   PLT 192 12/21/2022      Latest Ref Rng & Units 06/16/2022   10:18 PM  CMP  Glucose 70 - 99 mg/dL 119   BUN 6 - 20 mg/dL 7   Creatinine 0.44 - 1.00 mg/dL 0.48   Sodium 135 - 145 mmol/L 134   Potassium 3.5 - 5.1 mmol/L 3.5   Chloride 98 - 111 mmol/L 104   CO2 22 - 32 mmol/L 20   Calcium 8.9 - 10.3 mg/dL 9.0   Total Protein 6.5 - 8.1 g/dL 7.5   Total Bilirubin 0.3 - 1.2 mg/dL 0.6   Alkaline Phos 38 - 126 U/L 69   AST 15 - 41 U/L 18   ALT 0 - 44 U/L 21    Edinburgh Score:     No data to display           After visit meds:  Allergies as of 12/21/2022       Reactions   Metronidazole Hives, Shortness Of Breath, Rash, Other (See Comments)   Rash, Burning sensation- was hospitalized   Penicillins Shortness Of Breath, Itching, Rash   Has  patient had a PCN reaction causing immediate rash, facial/tongue/throat swelling, SOB or lightheadedness with hypotension: No Has patient had a PCN reaction causing severe rash involving mucus membranes or skin necrosis: No Has patient had a PCN reaction that required hospitalization Yes Has patient had a PCN reaction occurring within the last 10 years: No If all of the above answers are "NO", then may proceed with Cephalosporin use.   Amoxicillin Hives   Has patient had a PCN reaction causing immediate rash, facial/tongue/throat swelling, SOB or lightheadedness with hypotension: Yes Has patient had a PCN reaction causing severe rash involving mucus membranes or skin necrosis: No Has patient had a PCN reaction that required hospitalization No Has  patient had a PCN reaction occurring within the last 10 years: No If all of the above answers are "NO", then may proceed with Cephalosporin use.   Tape Rash   Surgical tape, bandaids     Med Rec must be completed prior to using this [facility]***        Discharge home in stable condition Infant Feeding: {Baby feeding:23562} Infant Disposition:{CHL IP OB HOME WITH VQQVZD:63875} Discharge instruction: per After Visit Summary and Postpartum booklet. Activity: Advance as tolerated. Pelvic rest for 6 weeks.  Diet: {OB IEPP:29518841} Future Appointments:No future appointments. Follow up Visit: Message sent to FT on 1/27  Please schedule this patient for a In person postpartum visit in 6 weeks with the following provider: Any provider. Additional Postpartum F/U:Postpartum Depression checkup and Incision check 1 week  High risk pregnancy complicated by:  h/o C/S x2, PTSD, h/o IUFD Delivery mode:  C-Section, Low Transverse rLTCS with BTL Anticipated Birth Control:  BTL done PP   12/21/2022 Shelda Pal, DO

## 2022-12-21 NOTE — Transfer of Care (Signed)
Immediate Anesthesia Transfer of Care Note  Patient: Jade Taylor  Procedure(s) Performed: REPEAT CESAREAN SECTION WITH BILATERAL TUBAL LIGATION  Patient Location: PACU  Anesthesia Type:Spinal  Level of Consciousness: awake, alert , and oriented  Airway & Oxygen Therapy: Patient Spontanous Breathing  Post-op Assessment: Report given to RN and Post -op Vital signs reviewed and stable  Post vital signs: Reviewed and stable  Last Vitals:  Vitals Value Taken Time  BP 104/69 12/21/22 1105  Temp    Pulse 87 12/21/22 1105  Resp 22 12/21/22 1105  SpO2 97 % 12/21/22 1105  Vitals shown include unvalidated device data.  Last Pain:  Vitals:   12/21/22 0636  TempSrc: Oral  PainSc: 0-No pain         Complications: No notable events documented.

## 2022-12-21 NOTE — Op Note (Signed)
Jade Taylor PROCEDURE DATE: 12/21/2022  PREOPERATIVE DIAGNOSES: Intrauterine pregnancy at 16w1dweeks gestation;  elective repeat, desire for sterilization  POSTOPERATIVE DIAGNOSES: The same, viable infant delivered  PROCEDURE: Repeat Low Transverse Cesarean Section with bilateral salpingectomy  SURGEON:  Dr. LTania Ade ASSISTANT:  JShelda Pal DO An experienced assistant was required given the standard of surgical care given the complexity of the case.  This assistant was needed for exposure, dissection, suctioning, retraction, instrument exchange, assisting with delivery with administration of fundal pressure, and for overall help during the procedure.  ANESTHESIOLOGY TEAM: No anesthesia staff entered.  INDICATIONS: Jade COLACINOis a 32y.o. [id number] 9964at 33w1dere for cesarean section secondary to the indications listed under preoperative diagnoses; please see preoperative note for further details.  The risks of surgery were discussed with the patient including but were not limited to: bleeding which may require transfusion or reoperation; infection which may require antibiotics; injury to bowel, bladder, ureters or other surrounding organs; injury to the fetus; need for additional procedures including hysterectomy in the event of a life-threatening hemorrhage; formation of adhesions; placental abnormalities wth subsequent pregnancies; incisional problems; thromboembolic phenomenon and other postoperative/anesthesia complications.  The patient concurred with the proposed plan, giving informed written consent for the procedure.    FINDINGS:  Viable female infant in cephalic presentation.  Apgars 9 and 9.  Amniotic fluid: clear.  Intact placenta, three vessel cord.  Normal uterus, fallopian tubes and ovaries bilaterally.  ANESTHESIA: spinal INTRAVENOUS FLUIDS: 1300 ml   ESTIMATED BLOOD LOSS: 311 ml URINE OUTPUT:  200 ml SPECIMENS: Placenta sent to L&D and arterial  cord gas collected, Right and left fallopian tubes COMPLICATIONS: None immediate  PROCEDURE IN DETAIL:  The patient preoperatively received intravenous antibiotics and had sequential compression devices applied to her lower extremities.  She was then taken to the operating room where spinal anesthesia was found to be adequate. She was then placed in a dorsal supine position with a leftward tilt, and prepped and draped in a sterile manner.  A foley catheter was  placed into her bladder and attached to constant gravity.  After an adequate timeout was performed, a Pfannenstiel skin incision was made with scalpel and carried through to the underlying layer of fascia. The fascia was incised in the midline, and this incision was extended bluntly. The rectus muscles were separated in the midline and the peritoneum was entered bluntly.   The Alexis self-retaining retractor was introduced into the abdominal cavity.  Attention was turned to the lower uterine segment where a low transverse hysterotomy was made with a scalpel and extended bluntly in caudad and cephalad directions.  The infant was successfully delivered, the cord was clamped and cut after one minute, and the infant was handed over to the awaiting neonatology team. Uterine massage was then administered, and the placenta delivered intact with a three-vessel cord. The uterus was then exteriorized and cleared of clots and debris.  The hysterotomy was closed with 0-Monocryl in a running fashion.   Attention was turned to the pt's left tube which was grasped and followed to its fimbriated end.  A Kelly clamps used to clamp the mesosalpinx and tube with fimbriated end removed with Metzenbaum scissors. A 0 Monocryl was used in a Heaney type stitch beneath the Kelly clamp for hemostasis. A similar process was carried out on the  right to allow for bilateral salpingectomy.  The pelvis was cleared of all clot and debris. Hemostasis was confirmed on all  surfaces.  The uterus was returned to the abdomen and the incision was once again inspected and found to be hemostatic. The retractor was removed.  The peritoneum was not closed. The fascia was then closed using 0 Vicryl in a running fashion.  The subcutaneous layer was irrigated, any areas of bleeding were cauterized with the bovie,  was reapproximated with 2-0 plain gut in a running fashion, was found to be hemostatic..Subcutaneous tissue infused with 20cc 1.3% bupivacaine liposome injection. Arista was used to help achieve overall subcutaneous hemostasis. The skin was closed with a 4-0 Vicryl subcuticular stitch. The patient tolerated the procedure well. Sponge, instrument and needle counts were correct x 3.  She was taken to the recovery room in stable condition.   Shelda Pal, [place] Fellow, Faculty practice [place] for [facility] 12/21/22  10:47 AM

## 2022-12-21 NOTE — Anesthesia Preprocedure Evaluation (Addendum)
Anesthesia Evaluation  Patient identified by MRN, date of birth, ID band Patient awake    Reviewed: Allergy & Precautions, NPO status , Patient's Chart, lab work & pertinent test results  Airway Mallampati: III  TM Distance: >3 FB Neck ROM: Full    Dental  (+) Teeth Intact, Dental Advisory Given   Pulmonary asthma , former smoker   breath sounds clear to auscultation       Cardiovascular negative cardio ROS  Rhythm:Regular     Neuro/Psych  PSYCHIATRIC DISORDERS Anxiety        GI/Hepatic negative GI ROS, Neg liver ROS,,,  Endo/Other  negative endocrine ROS    Renal/GU negative Renal ROS     Musculoskeletal   Abdominal   Peds  Hematology negative hematology ROS (+) Lab Results      Component                Value               Date                      WBC                      14.0 (H)            12/21/2022                HGB                      12.1                12/21/2022                HCT                      36.4                12/21/2022                MCV                      86.7                12/21/2022                PLT                      192                 12/21/2022              Anesthesia Other Findings   Reproductive/Obstetrics (+) Pregnancy                             Anesthesia Physical Anesthesia Plan  ASA: 3  Anesthesia Plan: Spinal   Post-op Pain Management: Ofirmev IV (intra-op)*   Induction:   PONV Risk Score and Plan: 2 and Ondansetron and Treatment may vary due to age or medical condition  Airway Management Planned: Nasal Cannula and Natural Airway  Additional Equipment: None  Intra-op Plan:   Post-operative Plan:   Informed Consent: I have reviewed the patients History and Physical, chart, labs and discussed the procedure including the risks, benefits and alternatives for the proposed anesthesia with the patient or authorized representative who  has indicated his/her understanding and acceptance.  Dental advisory given  Plan Discussed with: CRNA  Anesthesia Plan Comments:        Anesthesia Quick Evaluation

## 2022-12-21 NOTE — Lactation Note (Signed)
This note was copied from a baby's chart. Lactation Consultation Note  Patient Name: Jade Taylor [id number] Date: 12/21/2022 Reason for consult: Initial assessment;Mother's request;Term;Primapara;1st time breastfeeding;Breastfeeding assistance;Exclusive pumping and bottle feeding Age:33 hours  LC entered the room and the infant was asleep in the bassinet.  Per the birth parent, the infant has not latched to the breast.  She stated that she has tried to pump, but has not seen anything.  LC assisted the birth parent with hand expression and drops were noted on the left breast.  The birth parent's preference is to exclusively pump and bottle feed.  LC spoke with the birth parent about pumping frequency and milk storage.  The pump was set by the RN.  The birth parent had no further questions.   Infant Feeding Plan:  Pump every 3 hours.  Feed the expressed milk to the infant via a bottle.  Call the [facility] for assistance with breastfeeding.   Maternal Data Has patient been taught Hand Expression?: Yes Does the patient have breastfeeding experience prior to this delivery?: No  Interventions Interventions: Breast feeding basics reviewed;Hand express;Breast massage;Education;LC Services brochure  Discharge Pump: Personal;DEBP  Consult Status Consult Status: Follow-up Date: 12/22/22 Follow-up type: In-patient  Lysbeth Penner 12/21/2022, 3:54 PM

## 2022-12-21 NOTE — Progress Notes (Signed)
[name] was referred for history of anxiety/PTSD. * Referral screened out by Clinical Social Worker because none of the following criteria appear to apply: ~ History of anxiety/depression during this pregnancy, or of post-partum depression following prior delivery. ~ Diagnosis of anxiety and/or depression within last 3 years OR * [name]'s symptoms currently being treated with medication and/or therapy. [name] has an active prescription for Hydroxyzine 25mg  PRN. Please contact the Clinical Social Worker if needs arise, by [facility] request, or if [name] scores greater than 9/yes to question 10 on [place] Postpartum Depression Screen.  Signed,  Berniece Salines, MSW, LCSWA, LCASA 12/21/2022 2:21 PM

## 2022-12-22 LAB — CBC
HCT: 31.1 % — ABNORMAL LOW (ref 36.0–46.0)
Hemoglobin: 10.5 g/dL — ABNORMAL LOW (ref 12.0–15.0)
MCH: 29.6 pg (ref 26.0–34.0)
MCHC: 33.8 g/dL (ref 30.0–36.0)
MCV: 87.6 fL (ref 80.0–100.0)
Platelets: 187 10*3/uL (ref 150–400)
RBC: 3.55 MIL/uL — ABNORMAL LOW (ref 3.87–5.11)
RDW: 14.7 % (ref 11.5–15.5)
WBC: 14.1 10*3/uL — ABNORMAL HIGH (ref 4.0–10.5)
nRBC: 0 % (ref 0.0–0.2)

## 2022-12-22 MED ORDER — FERROUS SULFATE 325 (65 FE) MG PO TABS
325.0000 mg | ORAL_TABLET | ORAL | Status: DC
  Administered 2022-12-22: 325 mg via ORAL
  Filled 2022-12-22: qty 1

## 2022-12-22 NOTE — Progress Notes (Signed)
POSTPARTUM PROGRESS NOTE  POD #1  Subjective:  Jade Taylor is a 32 y.o. [id number] s/p rLTCS at [redacted]w[redacted]d.  She reports she doing well. No acute events overnight. She reports she is doing well. She denies any problems with po intake. Foley was just taken out and she is yet to ambulate or void. Denies nausea or vomiting. She has  passed flatus. Pain is well controlled.  Lochia is minimal.  Objective: Blood pressure (!) 95/44, pulse 84, temperature 98.1 F (36.7 C), temperature source Oral, resp. rate 18, height 5\' 2"  (1.575 m), weight 120.1 kg, last menstrual period 04/13/2022, SpO2 100 %, unknown if currently breastfeeding.  Physical Exam:  General: alert, cooperative and no distress Chest: no respiratory distress Heart:regular rate, distal pulses intact Abdomen: soft, nontender,  Uterine Fundus: firm, appropriately tender DVT Evaluation: No calf swelling or tenderness Extremities: no edema Skin: warm, dry; incision clean/dry/intact w/ honeycomb dressing in place  Recent Labs    12/21/22 0640 12/22/22 0448  HGB 12.1 10.5*  HCT 36.4 31.1*    Assessment/Plan: Jade Taylor is a 32 y.o. [id number] s/p rLTCS at [redacted]w[redacted]d for history of IUFD.  POD#1 - Doing welll; pain well controlled. H/H appropriate  Routine postpartum care  OOB, ambulated  Lovenox for VTE prophylaxis Mild Anemia: asymptomatic  Start po ferrous sulfate every other day Contraception: BTL done Feeding: both  Dispo: Plan for discharge in 1-2 days.   LOS: 1 day   Liliane Channel MD MPH OB Fellow, [place] [facility] [place] 12/22/2022

## 2022-12-22 NOTE — Lactation Note (Signed)
This note was copied from a baby's chart. Lactation Consultation Note  Patient Name: Jade Taylor [id number]'C Date: 12/22/2022   Age:33 hours  P2, 37.[redacted] weeks gestation, 5% weight loss at 6 lbs 0 oz  Mother had stated yesterday that she only wanted to pump and bottle feed. Today, she is requesting help to latch baby to breast. Infant formula fed 20 ml less than an hour ago. Mother to call [facility] when infant is showing feeding cues for breastfeeding assistance.   Mother has an older child and has had two IUFDs at 76 and [redacted] weeks gestation. She reports she had milk production after the birth of her first child but she did not breastfeed him. She has been pumping approximately q 3 hours to stimulate milk supply and reports she has a DEBP at home.  Interventions Education regarding pumping   Consult Status   Follow up  Inpatient 12/22/2022 Mother to call for latch assist   Gwenevere Abbot 12/22/2022, 4:46 PM

## 2022-12-23 ENCOUNTER — Other Ambulatory Visit (HOSPITAL_COMMUNITY): Payer: Self-pay

## 2022-12-23 MED ORDER — OXYCODONE HCL 5 MG PO TABS
5.0000 mg | ORAL_TABLET | ORAL | 0 refills | Status: DC | PRN
  Filled 2022-12-23: qty 20, 4d supply, fill #0

## 2022-12-23 MED ORDER — ACETAMINOPHEN 500 MG PO TABS
1000.0000 mg | ORAL_TABLET | Freq: Four times a day (QID) | ORAL | 0 refills | Status: DC
  Filled 2022-12-23: qty 30, 4d supply, fill #0

## 2022-12-23 MED ORDER — IBUPROFEN 600 MG PO TABS
600.0000 mg | ORAL_TABLET | Freq: Four times a day (QID) | ORAL | 0 refills | Status: DC
  Filled 2022-12-23: qty 30, 8d supply, fill #0

## 2022-12-23 MED ORDER — FERROUS SULFATE 325 (65 FE) MG PO TABS
325.0000 mg | ORAL_TABLET | ORAL | 3 refills | Status: DC
  Filled 2022-12-23: qty 30, 60d supply, fill #0

## 2022-12-23 NOTE — Lactation Note (Signed)
This note was copied from a baby's chart. Lactation Consultation Note  Patient Name: Jade Taylor Date: 12/23/2022   Age:33 hours   LC Note:  Arrived to find the RN completing discharge instructions with mother.  She feels confident with her breast feeding and does not desire any further consults.  Family has our op phone number for any questions after discharge.   Maternal Data    Feeding Nipple Type: Slow - flow  LATCH Score                    Lactation Tools Discussed/Used    Interventions    Discharge    Consult Status      Jade Taylor 12/23/2022, 10:55 AM

## 2022-12-24 ENCOUNTER — Encounter (HOSPITAL_COMMUNITY): Payer: Self-pay | Admitting: Obstetrics & Gynecology

## 2022-12-24 LAB — SURGICAL PATHOLOGY

## 2022-12-24 NOTE — Addendum Note (Signed)
Addendum  created 12/24/22 0700 by Oleta Mouse, MD   Intraprocedure Staff edited

## 2022-12-27 ENCOUNTER — Ambulatory Visit (INDEPENDENT_AMBULATORY_CARE_PROVIDER_SITE_OTHER): Payer: Medicaid Other | Admitting: Obstetrics & Gynecology

## 2022-12-27 ENCOUNTER — Encounter: Payer: Self-pay | Admitting: Obstetrics & Gynecology

## 2022-12-27 ENCOUNTER — Other Ambulatory Visit: Payer: Medicaid Other

## 2022-12-27 VITALS — BP 128/78 | HR 87 | Ht 62.0 in | Wt 268.0 lb

## 2022-12-27 DIAGNOSIS — Z9889 Other specified postprocedural states: Secondary | ICD-10-CM

## 2022-12-27 NOTE — Progress Notes (Signed)
  HPI: Patient returns for routine postoperative follow-up having undergone repeat C section 12/21/22 on .  The patient's immediate postoperative recovery has been unremarkable. Since hospital discharge the patient reports no problems.   Current Outpatient Medications: acetaminophen (TYLENOL) 500 MG tablet, Take 2 tablets (1,000 mg total) by mouth every 6 (six) hours., Disp: 30 tablet, Rfl: 0 albuterol (VENTOLIN HFA) 108 (90 Base) MCG/ACT inhaler, Inhale 2 puffs into the lungs every 6 (six) hours as needed for wheezing or shortness of breath., Disp: 8 g, Rfl: 2 ferrous sulfate 325 (65 FE) MG tablet, Take 1 tablet (325 mg total) by mouth every other day., Disp: 30 tablet, Rfl: 3 hydrOXYzine (ATARAX) 25 MG tablet, Take 1 tablet (25 mg total) by mouth 3 (three) times daily as needed for anxiety., Disp: 30 tablet, Rfl: 0 ibuprofen (ADVIL) 600 MG tablet, Take 1 tablet (600 mg total) by mouth every 6 (six) hours., Disp: 30 tablet, Rfl: 0 oxyCODONE (OXY IR/ROXICODONE) 5 MG immediate release tablet, Take 1 tablet (5 mg total) by mouth every 4 (four) hours as needed for moderate pain., Disp: 20 tablet, Rfl: 0 Prenatal Vit-Fe Fumarate-FA (WESTAB PLUS) 27-1 MG TABS, Take 1 tablet by mouth daily. (Patient taking differently: Take 1 tablet by mouth daily at 6 PM.), Disp: 100 tablet, Rfl: 11  No current facility-administered medications for this visit.    Blood pressure 128/78, pulse 87, height 5\' 2"  (1.575 m), weight 268 lb (121.6 kg), currently breastfeeding.  Physical Exam: Incision clean dry intact  Diagnostic Tests:   Pathology:   Impression + Management plan:   ICD-10-CM   1. Post-operative state  Z98.890          Medications Prescribed this encounter: No orders of the defined types were placed in this encounter.     Follow up: No follow-ups on file.    Florian Buff, MD Attending Physician for the Center for [facility] 12/27/2022 10:11  AM

## 2022-12-30 ENCOUNTER — Encounter: Payer: Medicaid Other | Admitting: Advanced Practice Midwife

## 2023-01-27 ENCOUNTER — Ambulatory Visit: Payer: Medicaid Other | Admitting: Obstetrics and Gynecology

## 2023-01-28 ENCOUNTER — Ambulatory Visit: Payer: Medicaid Other | Admitting: Obstetrics and Gynecology

## 2023-01-31 ENCOUNTER — Encounter: Payer: Self-pay | Admitting: Obstetrics and Gynecology

## 2023-01-31 ENCOUNTER — Ambulatory Visit (INDEPENDENT_AMBULATORY_CARE_PROVIDER_SITE_OTHER): Payer: Medicaid Other | Admitting: Obstetrics and Gynecology

## 2023-01-31 NOTE — Progress Notes (Signed)
Subjective:     Jade Taylor is a 32 y.o. female who presents for a postpartum visit. She is 6 weeks postpartum following a low cervical transverse Cesarean section. I have fully reviewed the prenatal and intrapartum course. The delivery was at 58 gestational weeks. Outcome: repeat cesarean section, low vertical incision. Anesthesia: spinal. Postpartum course has been unremarkable. Baby's course has been unremarkable. Baby is feeding by bottle - [place] Good Start. Bleeding no bleeding. Bowel function is normal. Bladder function is normal. Patient is sexually active. Contraception method is tubal ligation. Postpartum depression screening: negative.  Medical Records  Review of Systems Pertinent items noted in HPI and remainder of comprehensive ROS otherwise negative.   Objective:    BP 135/86 (BP Location: Left Arm, Patient Position: Sitting, Cuff Size: Normal)   Pulse 86   Ht '5\' 2"'$  (1.575 m)   Wt 258 lb (117 kg)   LMP 01/29/2023   Breastfeeding No   BMI 47.19 kg/m   General:  alert   Breasts:  NA  Lungs: clear to auscultation bilaterally  Heart:  regular rate and rhythm, S1, S2 normal, no murmur, click, rub or gallop  Abdomen: soft, non-tender; bowel sounds normal; no masses,  no organomegaly, incision well healed   Vulva:  not evaluated  Vagina: not evaluated  Cervix:  NA  Corpus: not examined  Adnexa:  not evaluated  Rectal Exam: Not performed.        Assessment:    Nl postpartum exam. Pap smear not done at today's visit.   Plan:    Doing well. Return to nl ADL's as tolerated F/U PRN

## 2023-06-24 ENCOUNTER — Telehealth: Payer: Self-pay | Admitting: Orthopaedic Surgery

## 2023-06-24 ENCOUNTER — Encounter: Payer: Self-pay | Admitting: Neurology

## 2023-06-24 NOTE — Telephone Encounter (Signed)
Received call from patient. She is needing copy of all available medical records. She is going to sign authorization at [facility] and they will fax auth to me to process. Pts ph [phone]

## 2023-07-02 ENCOUNTER — Ambulatory Visit
Admission: RE | Admit: 2023-07-02 | Discharge: 2023-07-02 | Disposition: A | Discharge status: Home / Self Care | Payer: Medicaid Other | Source: Ambulatory Visit

## 2023-07-02 VITALS — BP 97/56 | HR 101 | Temp 98.4°F | Resp 13

## 2023-07-02 DIAGNOSIS — M722 Plantar fascial fibromatosis: Secondary | ICD-10-CM

## 2023-07-02 DIAGNOSIS — M79671 Pain in right foot: Secondary | ICD-10-CM

## 2023-07-02 DIAGNOSIS — G8929 Other chronic pain: Secondary | ICD-10-CM

## 2023-07-02 MED ORDER — DEXAMETHASONE SODIUM PHOSPHATE 10 MG/ML IJ SOLN
10.0000 mg | INTRAMUSCULAR | Status: AC
  Administered 2023-07-02: 10 mg via INTRAMUSCULAR

## 2023-07-02 MED ORDER — PREDNISONE 20 MG PO TABS: 40.0000 mg | ORAL_TABLET | Freq: Every day | ORAL | 0 refills | Status: AC

## 2023-07-02 NOTE — Discharge Instructions (Signed)
Your symptoms appear to be consistent with plantar fasciitis.  You were given Decadron 10 mg IM.  Start the prednisone prescribed on 07/03/2023. Take medication as prescribed. Recommend freezing a water bottle and rolling it under the right foot to help with pain or discomfort. Make sure you are wearing shoes with good insole and support. I have provided exercises for you to do at home to help with your symptoms.  Recommend performing the exercises at least 2-3 times daily. Follow-up with neurology as scheduled.  If needed, recommend following up with orthopedics or podiatry for further evaluation. Follow-up as needed.

## 2023-07-02 NOTE — ED Triage Notes (Signed)
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 Having difficulty walking cannot bare down on the foot unsure if the nerve damage is causing the pain pt has tried OTC medication for the pain and nothing has actually helped with discomfort.

## 2023-07-02 NOTE — ED Provider Notes (Signed)
[facility]    CSN: [id number] Arrival date & time: 07/02/23  1442      History   Chief Complaint Chief Complaint  Patient presents with   Foot Pain    Entered by patient    HPI Jade Taylor is a 32 y.o. female.   The history is provided by the patient.   Patient presents for complaints of right foot pain has been present for the past several months.  She states over the last several days, pain in the right foot has worsened.  Patient states this morning when she tried to get out of bed, she could not stand or bear weight using the right foot.  She states that she has been limping, walking on her tiptoes to get around.  She denies injury, trauma, swelling, bruising, ankle pain, or complete inability to bear weight.  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  She states that she had to stop for a while due to insurance issues.  She states that she now has insurance, and is planning on following up with them.  Patient states that she has been taking over-the-counter medication with minimal relief. 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/2022   History of cesarean delivery 07/01/2022   Calculus of gallbladder with acute cholecystitis without obstruction    Allergy to adhesive tape 02/28/2017   History of IUFD 08/07/2016   Chronic pain syndrome    PTSD (post-traumatic stress disorder)    Asthma    Bilateral shoulder pain 01/25/2016   CARPAL TUNNEL SYNDROME  02/01/2009    Past Surgical History:  Procedure Laterality Date   CARPAL TUNNEL RELEASE     CESAREAN SECTION N/A 01/23/2017   Procedure: CESAREAN SECTION;  Surgeon: Willodean Rosenthal, MD;  Location: [facility];  Service: Obstetrics;  Laterality: N/A;   CESAREAN SECTION WITH BILATERAL TUBAL LIGATION N/A 12/21/2022   Procedure: REPEAT CESAREAN SECTION WITH BILATERAL TUBAL LIGATION;  Surgeon: Lazaro Arms, MD;  Location: [facility] OR[facility];  Service: Obstetrics;  Laterality: N/A;   CHOLECYSTECTOMY N/A 08/17/2018   Procedure: LAPAROSCOPIC CHOLECYSTECTOMY;  Surgeon: Franky Macho, MD;  Location: [facility];  Service: General;  Laterality: N/A;   WISDOM TOOTH EXTRACTION      OB History     Gravida  6   Para  4   Term  2   Preterm  2   AB  2   Living  2      SAB  1   IAB  1   Ectopic      Multiple  0   Live Births  2            Home Medications    Prior to Admission medications   Medication Sig Start Date End Date Taking? Authorizing Provider  FEROSUL 325 (65 Fe) MG tablet Take 325 mg by mouth daily. 05/27/23  Yes [provider]  phentermine 30 MG capsule Take 30  mg by mouth every morning. 06/23/23  Yes [provider]  Prenatal Vit-Fe Fumarate-FA (PRENATAL VITAMINS) 28-0.8 MG TABS Take 1 tablet by mouth daily. 06/24/23  Yes [provider]  Vitamin D, Ergocalciferol, (DRISDOL) 1.25 MG (50000 UNIT) CAPS capsule Take 50,000 Units by mouth once a week. 05/27/23  Yes [provider]    Family History Family History  Problem Relation Age of Onset   Bipolar disorder Mother    Schizophrenia Mother    Diabetes Mother    Hypertension Mother    Kidney disease Mother    Stroke Father    Heart disease Maternal Grandmother    Heart disease Paternal Grandfather     Social History Social History   Tobacco Use   Smoking status: Former    Current packs/day: 0.50    Average packs/day: 0.5 packs/day for 0.5 years (0.3 ttl pk-yrs)     Types: Cigarettes   Smokeless tobacco: Never  Vaping Use   Vaping status: Former  Substance Use Topics   Alcohol use: Yes    Comment: occ   Drug use: Not Currently    Types: Marijuana    Comment: prior to pregnancy     Allergies   Metronidazole, Penicillins, Amoxicillin, and Tape   Review of Systems Review of Systems Per HPI  Physical Exam Triage Vital Signs ED Triage Vitals  Encounter Vitals Group     BP 07/02/23 1522 (!) 97/56     Systolic BP Percentile --      Diastolic BP Percentile --      Pulse Rate 07/02/23 1522 (!) 101     Resp 07/02/23 1522 13     Temp 07/02/23 1522 98.4 F (36.9 C)     Temp Source 07/02/23 1522 Oral     SpO2 07/02/23 1522 97 %     Weight --      Height --      Head Circumference --      Peak Flow --      Pain Score 07/02/23 1526 8     Pain Loc --      Pain Education --      Exclude from Growth Chart --    No data found.  Updated Vital Signs BP (!) 97/56 (BP Location: Right Arm)   Pulse (!) 101   Temp 98.4 F (36.9 C) (Oral)   Resp 13   LMP 06/27/2023   SpO2 97%   Breastfeeding No   Visual Acuity Right Eye Distance:   Left Eye Distance:   Bilateral Distance:    Right Eye Near:   Left Eye Near:    Bilateral Near:     Physical Exam Vitals and nursing note reviewed.  Constitutional:      General: She is not in acute distress.    Appearance: Normal appearance.  HENT:     Head: Normocephalic.  Pulmonary:     Effort: Pulmonary effort is normal.  Musculoskeletal:     Cervical back: Normal range of motion.     Right foot: Normal range of motion and normal capillary refill. No swelling or deformity. Normal pulse.     Comments: Tenderness to right heel extending to midfoot. No obvious swelling, deformity, erythema or bruising noted. Pain in heel increased with flexion of foot.  Lymphadenopathy:     Cervical: No cervical adenopathy.  Skin:    General: Skin is warm and dry.  Neurological:     General: No focal deficit  present.  Mental Status: She is alert and oriented to person, place, and time.  Psychiatric:        Mood and Affect: Mood normal.        Behavior: Behavior normal.      UC Treatments / Results  Labs (all labs ordered are listed, but only abnormal results are displayed) Labs Reviewed - No data to display  EKG   Radiology No results found.  Procedures Procedures (including critical care time)  Medications Ordered in UC Medications - No data to display  Initial Impression / Assessment and Plan / UC Course  I have reviewed the triage vital signs and the nursing notes.  Pertinent labs & imaging results that were available during my care of the patient were reviewed by me and considered in my medical decision making (see chart for details).  The patient is well-appearing, she is in no acute distre discusseds havings, vital signs are stable.  Symptoms appear to be consistent with plantar fasciitis.  Decadron 10 mg IM administered while in the clinic.  Patient was prescribed prednisone 40 mg for the next 5 days.  Supportive care recommendations were provided and discussed with the patient to include over-the-counter Tylenol for pain or discomfort, rolling the right foot across a frozen water bottle, wearing shoes with good insole and support, and performing the rehab exercises provided.  Patient was advised to follow-up with neurology or with orthopedics as needed for for worsening symptoms.  Patient was in agreement with this plan of care and verbalizes understanding.  All questions were answered.  Patient stable for discharge.   Final Clinical Impressions(s) / UC Diagnoses   Final diagnoses:  None   Discharge Instructions   None    ED Prescriptions   None    PDMP not reviewed this encounter.   Abran Cantor, NP 07/02/23 [phone]

## 2023-07-09 NOTE — Progress Notes (Deleted)
Initial neurology clinic note  Reason for Evaluation: Consultation requested by Jade Peri, MD for an opinion regarding ***. My final recommendations will be communicated back to the requesting physician by way of shared medical record or letter to requesting physician via [place] mail.  HPI: This is [name], a 32 y.o. ***-handed female with a medical history of B12 deficiency, iron deficiency anemia*** who presents to neurology clinic with the chief complaint of ***. The patient is accompanied by ***.  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 Gerilyn Taylor at the age of 40***  On alpha lipoic acid 300 mg Ferrous sulfate 325 mg Topamax 50 mg Vit D supplement  The patient has not*** had similar episodes of symptoms in the past. ***  Muscle bulk loss? *** Muscle pain? ***  Cramps/Twitching? *** Suggestion of myotonia/difficulty relaxing after contraction? ***  Fatigable weakness?*** Does strength improve after brief exercise?***  Able to brush hair/teeth without difficulty? *** Able to button shirts/use zips? *** Clumsiness/dropping grasped objects?*** Can you arise from squatted position easily? *** Able to get out of chair without using Taylor? *** Able to walk up steps easily? *** Use an assistive device to walk? *** Significant imbalance with walking? *** Falls?*** Any change in urine color, especially after exertion/physical activity? ***  The patient denies*** symptoms suggestive of oculobulbar weakness including diplopia, ptosis, dysphagia, poor saliva control, dysarthria/dysphonia, impaired mastication, facial weakness/droop.  There are no*** neuromuscular respiratory weakness symptoms, particularly orthopnea>dyspnea.   Pseudobulbar affect is absent***.  The patient does not*** report symptoms referable to autonomic dysfunction including impaired sweating, heat or cold intolerance, excessive mucosal  dryness, gastroparetic early satiety, postprandial abdominal bloating, constipation, bowel or bladder dyscontrol, erectile dysfunction*** or syncope/presyncope/orthostatic intolerance.  There are no*** complaints relating to other symptoms of small fiber modalities including paresthesia/pain.  The patient has not *** noticed any recent skin rashes nor does he*** report any constitutional symptoms like fever, night sweats, anorexia or unintentional weight loss.  EtOH use: ***  Restrictive diet? *** Family history of neuropathy/myopathy/NM disease?***  Previous labs, electrodiagnostics, and neuroimaging are summarized below, but pertinent findings include***  Any biopsy done? *** Current medications being tried for the patient's symptoms include ***  Prior medications that have been tried: ***   MEDICATIONS:  Outpatient Encounter Medications as of 07/17/2023  Medication Sig   FEROSUL 325 (65 Fe) MG tablet Take 325 mg by mouth daily.   phentermine 30 MG capsule Take 30 mg by mouth every morning.   Prenatal Vit-Fe Fumarate-FA (PRENATAL VITAMINS) 28-0.8 MG TABS Take 1 tablet by mouth daily.   Vitamin D, Ergocalciferol, (DRISDOL) 1.25 MG (50000 UNIT) CAPS capsule Take 50,000 Units by mouth once a week.   No facility-administered encounter medications on file as of 07/17/2023.    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 Hilda Lias   PTSD (post-traumatic stress disorder)     PAST SURGICAL HISTORY: Past Surgical History:  Procedure Laterality Date   CARPAL TUNNEL RELEASE     CESAREAN SECTION N/A 01/23/2017   Procedure: CESAREAN SECTION;  Surgeon: Jade Rosenthal, MD;  Location: [facility] BIRTHING SUITES;  Service: Obstetrics;  Laterality: N/A;   CESAREAN SECTION WITH BILATERAL TUBAL LIGATION N/A 12/21/2022   Procedure: REPEAT CESAREAN SECTION WITH BILATERAL TUBAL  LIGATION;  Surgeon: Jade Arms, MD;  Location: [facility] LD ORS;  Service: Obstetrics;  Laterality: N/A;   CHOLECYSTECTOMY N/A 08/17/2018   Procedure: LAPAROSCOPIC CHOLECYSTECTOMY;  Surgeon: Jade Macho, MD;  Location: [facility] ORS;  Service: General;  Laterality: N/A;   WISDOM TOOTH EXTRACTION      ALLERGIES: Allergies  Allergen Reactions   Metronidazole Hives, Shortness Of Breath, Rash and Other (See Comments)    Rash, Burning sensation- was hospitalized   Penicillins Shortness Of Breath, Itching and Rash    Has patient had a PCN reaction causing immediate rash, facial/tongue/throat swelling, SOB or lightheadedness with hypotension: No Has patient had a PCN reaction causing severe rash involving mucus membranes or skin necrosis: No Has patient had a PCN reaction that required hospitalization Yes Has patient had a PCN reaction occurring within the last 10 years: No If all of the above answers are "NO", then may proceed with Cephalosporin use.    Amoxicillin Hives    Has patient had a PCN reaction causing immediate rash, facial/tongue/throat swelling, SOB or lightheadedness with hypotension: Yes Has patient had a PCN reaction causing severe rash involving mucus membranes or skin necrosis: No Has patient had a PCN reaction that required hospitalization No Has patient had a PCN reaction occurring within the last 10 years: No If all of the above answers are "NO", then may proceed with Cephalosporin use.    Tape Rash    Surgical tape, bandaids    FAMILY HISTORY: Family History  Problem Relation Age of Onset   Bipolar disorder Mother    Schizophrenia Mother    Diabetes Mother    Hypertension Mother    Kidney disease Mother    Stroke Father    Heart disease Maternal Grandmother    Heart disease Paternal Grandfather     SOCIAL HISTORY: Social History   Tobacco Use   Smoking status: Former    Current packs/day: 0.50    Average packs/day: 0.5 packs/day for 0.5 years (0.3 ttl pk-yrs)     Types: Cigarettes   Smokeless tobacco: Never  Vaping Use   Vaping status: Former  Substance Use Topics   Alcohol use: Yes    Comment: occ   Drug use: Not Currently    Types: Marijuana    Comment: prior to pregnancy   Social History   Social History Narrative   Not on file     OBJECTIVE: PHYSICAL EXAM: LMP 06/27/2023   General:*** General appearance: Awake and alert. No distress. Cooperative with exam.  Skin: No obvious rash or jaundice. HEENT: Atraumatic. Anicteric. Lungs: Non-labored breathing on room air  Heart: Regular Abdomen: Soft, non tender. Extremities: No edema. No obvious deformity.  Musculoskeletal: No obvious joint swelling. Psych: Affect appropriate.  Neurological: Mental Status: Alert. Speech fluent. No pseudobulbar affect Cranial Nerves: CNII: No RAPD. Visual fields grossly intact. CNIII, IV, VI: PERRL. No nystagmus. EOMI. CN V: Facial sensation intact bilaterally to fine touch. [place] clench strong. Jaw jerk***. CN VII: Facial muscles symmetric and strong. No ptosis at rest or after sustained upgaze***. CN VIII: Hearing grossly intact bilaterally. CN IX: No hypophonia. CN X: Palate elevates symmetrically. CN XI: Full strength shoulder shrug bilaterally. CN XII: Tongue protrusion full and midline. No atrophy or fasciculations. No significant dysarthria*** Motor: Tone is ***. *** fasciculations in *** extremities. *** atrophy. No grip or percussive myotonia.***  Individual muscle group testing (MRC grade out of 5):  Movement     Neck flexion ***    Neck extension ***  Right Left   Shoulder abduction *** ***   Shoulder adduction *** ***   Shoulder ext rotation *** ***   Shoulder int rotation *** ***   Elbow flexion *** ***   Elbow extension *** ***   Wrist extension *** ***   Wrist flexion *** ***   Finger abduction - FDI *** ***   Finger abduction - ADM *** ***   Finger extension *** ***   Finger distal flexion - 2/3 *** ***    Finger distal flexion - 4/5 *** ***   Thumb flexion - FPL *** ***   Thumb abduction - APB *** ***    Hip flexion *** ***   Hip extension *** ***   Hip adduction *** ***   Hip abduction *** ***   Knee extension *** ***   Knee flexion *** ***   Dorsiflexion *** ***   Plantarflexion *** ***   Inversion *** ***   Eversion *** ***   Great toe extension *** ***   Great toe flexion *** ***     Reflexes:  Right Left   Bicep *** ***   Tricep *** ***   BrRad *** ***   Knee *** ***   Ankle *** ***    Pathological Reflexes: Babinski: *** response bilaterally*** Hoffman: *** Troemner: *** Pectoral: *** Palmomental: *** Facial: *** Midline tap: *** Sensation: Pinprick: *** Vibration: *** Temperature: *** Proprioception: *** Coordination: Intact finger-to- nose-finger bilaterally. Romberg negative.*** Gait: Able to rise from chair with Taylor crossed unassisted. Normal, narrow-based gait. Able to tandem walk. Able to walk on toes and heels.***  Lab and Test Review: Internal labs: HbA1c (07/01/22): 5.3  External labs: 06/03/23: TSH wnl B12: 538 CBC unremarkable CMP unremarkable  Imaging: ***  ASSESSMENT: VALEKA MASSAR is a 32 y.o. female who presents for evaluation of ***. *** has a relevant medical history of ***. *** neurological examination is pertinent for ***. Available diagnostic data is significant for ***. This constellation of symptoms and objective data would most likely localize to ***. ***  PLAN: -Blood work: *** ***  -Return to clinic ***  The impression above as well as the plan as outlined below were extensively discussed with the patient (in the company of ***) who voiced understanding. All questions were answered to their satisfaction.  The patient was counseled on pertinent fall precautions per the printed material provided today, and as noted under the "Patient Instructions" section below.***  When available, results of the above  investigations and possible further recommendations will be communicated to the patient via telephone/MyChart. Patient to call office if not contacted after expected testing turnaround time.   Total time spent reviewing records, interview, history/exam, documentation, and coordination of care on day of encounter:  *** min   Thank you for allowing me to participate in patient's care.  If I can answer any additional questions, I would be pleased to do so.  Jacquelyne Balint, MD   CC: Golden Pop, FNP [address] [place] [address]  CC: Referring provider: [name], MD [address] [place],  [place] [address]

## 2023-07-10 NOTE — Progress Notes (Signed)
Initial neurology clinic note  Reason for Evaluation: Consultation requested by [name], MD for an opinion regarding numbness and tingling in feet and hands. My final recommendations will be communicated back to the requesting physician by way of shared medical record or letter to requesting physician via [place] mail.  HPI: This is [name], a 32 y.o. right-handed female with a medical history of B12 deficiency, iron deficiency anemia, bilateral carpal tunnel syndrome (dx at age 1) s/p release (at the age of 32) who presents to neurology clinic with the chief complaint of numbness and tingling in feet and hands. The patient is alone today.  Patient's symptoms started at the age of 32. She had numbness in her feet and hands. The feet were intermittent, but the hands were near constant. Patient had an EMG as a child and was told she had severe nerve damage in her legs and bilateral carpal tunnel syndrome. Per the EMG that patient provided provided from 2010, the EMG was only of upper extremities that showed bilateral carpal tunnel. She had bilateral CTS release at the age of 32 that helped for a few years, but symptoms came back. Per patient, she has never been told why she has nerve damage.  The symptoms in her hands are worse at night or when she drives. Her hands are not her primary concern though. It is her feet.  The symptoms in her feet has gotten worse over time. Patient describes sharp pains into her feet. Her feet are very sensitive to touch. She feels numbness in the lateral aspect of her left foot. Patient was told at urgent care a couple of weeks ago that she had plantar fasciitis. She got steroid shots in bilateral hips and a steroid dose pack that relieved symptoms for a short period of time. Patient is not sure about plantar fasciitis as she has bought special shoes with inserts that do not seem to help. Patient is currently taking Nervive multivitamin for nerve health.  She has been on gabapentin in the past, which she thinks helped. She is not aware of other nerve pain medications she has been given. She has tried lidocaine cream as well, that can give some relief. She has tried CBD oil, but this has not helped.  She endorses muscle spasms, more in the hands.  The patient denies symptoms suggestive of oculobulbar weakness including diplopia, ptosis, dysphagia, poor saliva control, dysarthria/dysphonia, impaired mastication, facial weakness/droop.  There are no neuromuscular respiratory weakness symptoms, particularly orthopnea>dyspnea.   She endorses dry mouth and gastroparetic early satiety but does not report symptoms referable to autonomic dysfunction including impaired sweating, heat or cold intolerance, excessive mucosal dryness, postprandial abdominal bloating, constipation, bowel or bladder dyscontrol, or syncope/presyncope/orthostatic intolerance.  She does not report any constitutional symptoms like fever, night sweats, anorexia or unintentional weight loss.  EtOH use: 2 drinks per month  Restrictive diet? No Family history of neuropathy/myopathy/neurologic disease? Mother has diabetic neuropathy  Of note, patient has 2 children, but 2 prior pregnancies resulted in stillborns. She was never given a reason for these. She was told she had a clotting disorder work up that was normal.   MEDICATIONS:  Outpatient Encounter Medications as of 07/11/2023  Medication Sig   FEROSUL 325 (65 Fe) MG tablet Take 325 mg by mouth daily.   gabapentin (NEURONTIN) 300 MG capsule Take 1 capsule (300 mg total) by mouth 2 (two) times daily.   phentermine 30 MG capsule Take 30 mg by mouth  every morning.   Prenatal Vit-Fe Fumarate-FA (PRENATAL VITAMINS) 28-0.8 MG TABS Take 1 tablet by mouth daily.   Vitamin D, Ergocalciferol, (DRISDOL) 1.25 MG (50000 UNIT) CAPS capsule Take 50,000 Units by mouth once a week.   No facility-administered encounter medications on file as of  07/11/2023.    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 Hilda Lias   PTSD (post-traumatic stress disorder)     PAST SURGICAL HISTORY: Past Surgical History:  Procedure Laterality Date   CARPAL TUNNEL RELEASE     CESAREAN SECTION N/A 01/23/2017   Procedure: CESAREAN SECTION;  Surgeon: Willodean Rosenthal, MD;  Location: [facility] BIRTHING SUITES;  Service: Obstetrics;  Laterality: N/A;   CESAREAN SECTION WITH BILATERAL TUBAL LIGATION N/A 12/21/2022   Procedure: REPEAT CESAREAN SECTION WITH BILATERAL TUBAL LIGATION;  Surgeon: Lazaro Arms, MD;  Location: [facility] LD ORS;  Service: Obstetrics;  Laterality: N/A;   CHOLECYSTECTOMY N/A 08/17/2018   Procedure: LAPAROSCOPIC CHOLECYSTECTOMY;  Surgeon: Franky Macho, MD;  Location: [facility] ORS;  Service: General;  Laterality: N/A;   WISDOM TOOTH EXTRACTION      ALLERGIES: Allergies  Allergen Reactions   Metronidazole Hives, Shortness Of Breath, Rash and Other (See Comments)    Rash, Burning sensation- was hospitalized   Penicillins Shortness Of Breath, Itching and Rash    Has patient had a PCN reaction causing immediate rash, facial/tongue/throat swelling, SOB or lightheadedness with hypotension: No Has patient had a PCN reaction causing severe rash involving mucus membranes or skin necrosis: No Has patient had a PCN reaction that required hospitalization Yes Has patient had a PCN reaction occurring within the last 10 years: No If all of the above answers are "NO", then may proceed with Cephalosporin use.    Amoxicillin Hives    Has patient had a PCN reaction causing immediate rash, facial/tongue/throat swelling, SOB or lightheadedness with hypotension: Yes Has patient had a PCN reaction causing severe rash involving mucus membranes or skin necrosis: No Has patient had a PCN reaction that required hospitalization No Has patient had a  PCN reaction occurring within the last 10 years: No If all of the above answers are "NO", then may proceed with Cephalosporin use.    Tape Rash    Surgical tape, bandaids    FAMILY HISTORY: Family History  Problem Relation Age of Onset   Bipolar disorder Mother    Schizophrenia Mother    Diabetes Mother    Hypertension Mother    Kidney disease Mother    Stroke Father    Heart disease Maternal Grandmother    Heart disease Paternal Grandfather     SOCIAL HISTORY: Social History   Tobacco Use   Smoking status: Former    Current packs/day: 0.50    Average packs/day: 0.5 packs/day for 0.5 years (0.3 ttl pk-yrs)    Types: Cigarettes   Smokeless tobacco: Never  Vaping Use   Vaping status: Former  Substance Use Topics   Alcohol use: Yes    Comment: occ   Drug use: Not Currently    Types: Marijuana    Comment: prior to pregnancy   Social History   Social History Narrative   Are you right handed or left handed? Right   Are you currently employed ?    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 no  Who lives with you? children   What type of home do you live in: 1 story or 2 story? two    Caffeine 2 cups daily     OBJECTIVE: PHYSICAL EXAM: BP 137/85   Pulse 81   Ht 5\' 2"  (1.575 m)   Wt 162 lb (73.5 kg)   LMP 06/27/2023   SpO2 99%   BMI 29.63 kg/m   General: General appearance: Awake and alert. No distress. Cooperative with exam.  Skin: No obvious rash or jaundice. HEENT: Atraumatic. Anicteric. Lungs: Non-labored breathing on room air  Heart: Regular Abdomen: Soft, non tender. Extremities: No obvious deformity.  Musculoskeletal: No obvious joint swelling. Psych: Affect appropriate.  Neurological: Mental Status: Alert. Speech fluent. No pseudobulbar affect Cranial Nerves: CNII: No RAPD. Visual fields grossly intact. CNIII, IV, VI: PERRL. No nystagmus. EOMI. CN V: Facial sensation intact bilaterally to fine touch. CN VII:  Facial muscles symmetric and strong. No ptosis at rest. CN VIII: Hearing grossly intact bilaterally. CN IX: No hypophonia. CN X: Palate elevates symmetrically. CN XI: Full strength shoulder shrug bilaterally. CN XII: Tongue protrusion full and midline. No atrophy or fasciculations. No significant dysarthria Motor: Tone is normal. No atrophy, including in bilateral APB. No grip or percussive myotonia. Strength 5/5 in bilateral upper and lower extremities. Reflexes:  Right Left   Bicep 2+ 2+   Tricep 2+ 2+   BrRad 2+ 2+   Knee 2+ 2+   Ankle 2+ 2+    Pathological Reflexes: Babinski: flexor response bilaterally Hoffman: absent bilaterally Troemner: absent bilaterally Sensation: Tinel's test positive at left carpal tunnel, negative at right carpal tunnel and bilateral elbows. Phalen's test negative Pinprick: Intact in all extremities Vibration: Intact in all extremities Proprioception: Intact in bilateral great toes Coordination: Intact finger-to- nose-finger bilaterally. Romberg negative. Gait: Able to rise from chair with arms crossed unassisted. Normal, narrow-based gait. Able to tandem walk. Able to walk on toes and heels.  Lab and Test Review: Internal labs: HbA1c (07/01/22): 5.3  External labs: 06/03/23: TSH wnl B12: 538 CBC unremarkable CMP unremarkable   ASSESSMENT: LADESHA GOLDBERG is a 32 y.o. female who presents for evaluation of numbness and tingling in hands and feet and severe pain in feet. She has a relevant medical history of B12 deficiency, iron deficiency anemia, bilateral carpal tunnel syndrome (dx at age 51) s/p release (at the age of 50). Her neurological examination is essentially normal today. The etiology of patient's symptoms is currently unclear. She has been told in the past that she has severe nerve damage in her legs and arms and carpal tunnel syndrome. I do not have all the records, but do have an EMG of the arms that reports severe right and  moderate left carpal tunnel (no data tables). Given her normal exam, I am uncertain of this "severe nerve damage" or even carpal tunnel syndrome that she has been previously told, especially at the age of 34. She was never given a reason for the symptoms either. She was told at urgent care recently that foot symptoms could be plantar fasciitis. This seems reasonable to me, but would not explain symptoms in hands. I will attempt to clarify with EMG.  PLAN: -Blood work: B1, IFE -EMG: PN protocol (L > R) -Okay to continue [place], but could just take alpha lipoic acid 600 mg once or twice daily -Start gabapentin 300 mg BID. Of note, patient is not breast feeding and cannot get pregnant again (tubes tied) -Lidocaine cream PRN  -Return  to clinic to be determined  The impression above as well as the plan as outlined below were extensively discussed with the patient who voiced understanding. All questions were answered to their satisfaction.  When available, results of the above investigations and possible further recommendations will be communicated to the patient via telephone/MyChart. Patient to call office if not contacted after expected testing turnaround time.   Total time spent reviewing records, interview, history/exam, documentation, and coordination of care on day of encounter:  60 min   Thank you for allowing me to participate in patient's care.  If I can answer any additional questions, I would be pleased to do so.  Jade Balint, MD   CC: Golden Pop, FNP [address] [place] [address]  CC: Referring provider: [name], MD [address] [place],  [place] [address]

## 2023-07-11 ENCOUNTER — Encounter: Payer: Self-pay | Admitting: Neurology

## 2023-07-11 ENCOUNTER — Ambulatory Visit (INDEPENDENT_AMBULATORY_CARE_PROVIDER_SITE_OTHER): Payer: Medicaid Other | Admitting: Neurology

## 2023-07-11 ENCOUNTER — Other Ambulatory Visit (INDEPENDENT_AMBULATORY_CARE_PROVIDER_SITE_OTHER): Payer: Medicaid Other

## 2023-07-11 VITALS — BP 137/85 | HR 81 | Ht 62.0 in | Wt 262.0 lb

## 2023-07-11 DIAGNOSIS — M79671 Pain in right foot: Secondary | ICD-10-CM

## 2023-07-11 DIAGNOSIS — R209 Unspecified disturbances of skin sensation: Secondary | ICD-10-CM

## 2023-07-11 DIAGNOSIS — R200 Anesthesia of skin: Secondary | ICD-10-CM | POA: Diagnosis not present

## 2023-07-11 DIAGNOSIS — M79672 Pain in left foot: Secondary | ICD-10-CM

## 2023-07-11 DIAGNOSIS — R202 Paresthesia of skin: Secondary | ICD-10-CM

## 2023-07-11 MED ORDER — GABAPENTIN 300 MG PO CAPS: 300.0000 mg | ORAL_CAPSULE | Freq: Two times a day (BID) | ORAL | 11 refills | Status: DC

## 2023-07-11 NOTE — Patient Instructions (Addendum)
I saw you today for pain in your feet and numbness and tingling in feet and hands. I am not sure the cause of your symptoms and am unclear if this is nerve related. I would like to investigate further with: -Blood work today -Repeat of EMG (see more information below)  When I have the results, we will discuss next steps.  I am prescribing gabapentin 300 mg twice daily for your pain. This has been sent to your pharmacy.  You can also try Lidocaine cream as needed. Apply wear you have pain, tingling, or burning. Wear gloves to prevent your hands being numb. This can be bought over the counter at any drug store or online.   You can continue Nervive, but alpha lipoic acid in it is likely all you need, which would save you money in the future: Alpha lipoic acid 600mg  daily has some research data suggesting it helps with nerve health. No major side effects other than <1% of people report upset stomach. This can be taken twice per day (1200mg  daily) if no relief obtained. You can buy this over the counter or online.   The physicians and staff at [facility] are committed to providing excellent care. You may receive a survey requesting feedback about your experience at our office. We strive to receive "very good" responses to the survey questions. If you feel that your experience would prevent you from giving the office a "very good " response, please contact our office to try to remedy the situation. We may be reached at [phone]. Thank you for taking the time out of your busy day to complete the survey.  Jacquelyne Balint, MD [facility]  ELECTROMYOGRAM AND NERVE CONDUCTION STUDIES (EMG/NCS) INSTRUCTIONS  How to Prepare The neurologist conducting the EMG will need to know if you have certain medical conditions. Tell the neurologist and other EMG lab personnel if you: Have a pacemaker or any other electrical medical device Take blood-thinning medications Have hemophilia, a blood-clotting  disorder that causes prolonged bleeding Bathing Take a shower or bath shortly before your exam in order to remove oils from your skin. Don't apply lotions or creams before the exam.  What to Expect You'll likely be asked to change into a hospital gown for the procedure and lie down on an examination table. The following explanations can help you understand what will happen during the exam.  Electrodes. The neurologist or a technician places surface electrodes at various locations on your skin depending on where you're experiencing symptoms. Or the neurologist may insert needle electrodes at different sites depending on your symptoms.  Sensations. The electrodes will at times transmit a tiny electrical current that you may feel as a twinge or spasm. The needle electrode may cause discomfort or pain that usually ends shortly after the needle is removed. If you are concerned about discomfort or pain, you may want to talk to the neurologist about taking a short break during the exam.  Instructions. During the needle EMG, the neurologist will assess whether there is any spontaneous electrical activity when the muscle is at rest - activity that isn't present in healthy muscle tissue - and the degree of activity when you slightly contract the muscle.  He or she will give you instructions on resting and contracting a muscle at appropriate times. Depending on what muscles and nerves the neurologist is examining, he or she may ask you to change positions during the exam.  After your EMG You may experience some temporary, minor  bruising where the needle electrode was inserted into your muscle. This bruising should fade within several days. If it persists, contact your primary care doctor.

## 2023-07-16 ENCOUNTER — Encounter: Payer: Self-pay | Admitting: Neurology

## 2023-07-17 ENCOUNTER — Ambulatory Visit: Payer: Medicaid Other | Admitting: Neurology

## 2023-07-17 ENCOUNTER — Other Ambulatory Visit: Payer: Self-pay

## 2023-07-17 ENCOUNTER — Other Ambulatory Visit (INDEPENDENT_AMBULATORY_CARE_PROVIDER_SITE_OTHER): Payer: Medicaid Other

## 2023-07-17 DIAGNOSIS — R52 Pain, unspecified: Secondary | ICD-10-CM

## 2023-07-18 LAB — IMMUNOFIXATION ELECTROPHORESIS
IgG (Immunoglobin G), Serum: 1106 mg/dL (ref 600–1640)
IgM, Serum: 131 mg/dL (ref 50–300)
Immunoglobulin A: 166 mg/dL (ref 47–310)

## 2023-07-18 LAB — VITAMIN B1: Vitamin B1 (Thiamine): 13 nmol/L (ref 8–30)

## 2023-07-22 ENCOUNTER — Encounter: Payer: Self-pay | Admitting: Neurology

## 2023-07-22 LAB — ANA+ENA+DNA/DS+SCL 70+SJOSSA/B
ANA Titer 1: NEGATIVE
ENA RNP Ab: <0.2 AI (ref 0.0–0.9)
ENA SM Ab Ser-aCnc: <0.2 AI (ref 0.0–0.9)
ENA SSA (RO) Ab: <0.2 AI (ref 0.0–0.9)
ENA SSB (LA) Ab: <0.2 AI (ref 0.0–0.9)
Scleroderma (Scl-70) (ENA) Antibody, IgG: <0.2 AI (ref 0.0–0.9)
dsDNA Ab: <1 [IU]/mL (ref 0–9)

## 2023-08-11 ENCOUNTER — Ambulatory Visit: Payer: Medicaid Other | Admitting: Neurology

## 2023-08-11 ENCOUNTER — Telehealth: Payer: Self-pay

## 2023-08-11 ENCOUNTER — Other Ambulatory Visit: Payer: Self-pay

## 2023-08-11 ENCOUNTER — Telehealth: Payer: Self-pay | Admitting: Neurology

## 2023-08-11 DIAGNOSIS — R209 Unspecified disturbances of skin sensation: Secondary | ICD-10-CM

## 2023-08-11 DIAGNOSIS — M79671 Pain in right foot: Secondary | ICD-10-CM

## 2023-08-11 DIAGNOSIS — R200 Anesthesia of skin: Secondary | ICD-10-CM

## 2023-08-11 DIAGNOSIS — R52 Pain, unspecified: Secondary | ICD-10-CM

## 2023-08-11 MED ORDER — DULOXETINE HCL 30 MG PO CPEP
60.0000 mg | ORAL_CAPSULE | Freq: Every day | ORAL | 3 refills | Status: DC
Start: 2023-08-11 — End: 2023-11-18

## 2023-08-11 NOTE — Telephone Encounter (Signed)
Called [employer] and no PA is needed for CPT codes [phone]. No ref # was given it was automated.

## 2023-08-11 NOTE — Telephone Encounter (Signed)
PA not needed for Biopsy or NMUS

## 2023-08-11 NOTE — Telephone Encounter (Signed)
-----   Message from Antony Madura sent at 08/11/2023 12:24 PM EDT ----- Regarding: NMUS and skin biopsy Salaya Holtrop, can you order NMUS of both arms and a skin biopsy for this patient?  Annabelle Harman, can you put her on my schedule next Tuesday (08/19/23) at 9 am for the ultrasound and 9:30 am for the biopsy? Patient is aware and can make those appointments.  Thank you both!  Riki Rusk

## 2023-08-11 NOTE — Telephone Encounter (Signed)
Called patient to discuss the results of EMG today. It showed likely the residuals of her old carpal tunnel syndrome. There was no evidence of a large fiber sensorimotor neuropathy.  Given her continued symptoms, we discussed skin biopsy to evaluate for small fiber neuropathy and neuromuscular ultrasound to evaluate the carpal tunnel to look for evidence of median nerve swelling or compression. Patient agreed to both.   She has not responded to gabapentin 300 mg BID. I am suspicious that symptoms may not be nerve pain and may be a chronic pain syndrome such as fibromyalgia. Given this, patient agreed to try Cymbalta. She will take 30 mg for 1 week, then increase to 60 mg thereafter.  All questions were answered.  Jacquelyne Balint, MD [facility]

## 2023-08-11 NOTE — Procedures (Signed)
[facility]  [address], [address]  [place], [place] [address] Tel: [phone] Fax: [phone] Test Date:  08/11/2023  Patient: Jade Taylor DOB: 11-Jun-1991 Physician: Jacquelyne Balint, MD  Sex: Female Height: 5\' 2"  Ref Phys: Jacquelyne Balint, MD  ID#: [id number]   Technician:    History: This is a 32 year old female with numbness and tingling in feet and hands.  NCV & EMG Findings: Extensive electrodiagnostic evaluation of the left upper and lower limbs shows: Left sural, superficial peroneal/fibular, ulnar, and radial sensory responses are within normal limits. Left median sensory response shows borderline distal peak latency (3.4 ms). Left peroneal/fibular (EDB) motor response shows prolonged distal onset latency (5.9 ms) and reduced amplitude (0.88 mV). Left tibial (AH), median (APB), and ulnar (ADM) motor responses are within normal limits. Left H reflex is absent. Chronic motor axon loss changes without accompanying active denervation changes are seen in the left abductor pollicis brevis muscle. Patient was unable to activate the left extensor digitorum brevis muscle in the foot for needle evaluation.  Impression: This study shows no significant abnormalities. Findings include: Chronic neurogenic changes are seen in the left APB but with normal median sensory and motor responses, likely representing prior left carpal tunnel syndrome s/p surgical repair. No electrodiagnostic evidence of a large fiber sensorimotor neuropathy. No electrodiagnostic evidence of a left cervical (C5-T1) or lumbosacral (L3-S1) motor radiculopathy. Absent left H reflex and low amplitude response in the left peroneal/fibular (EDB) motor response are favored to be technical in nature given the otherwise unremarkable needle examination, perhaps due to patient physiognomy.    ___________________________ Jacquelyne Balint, MD    Nerve Conduction Studies Motor Nerve Results    Latency Amplitude  F-Lat Segment Distance CV Comment  Site (ms) Norm (mV) Norm (ms)  (cm) (m/s) Norm   Left Fibular (EDB) Motor  Ankle *5.9  < 5.5 *0.88  > 3.0        Bel fib head 9.2 - 0.64 -  Bel fib head-Ankle 28.5 86  > 40   Pop fossa 10.4 - 0.53 -  Pop fossa-Bel fib head 8 67 -   Left Fibular (TA) Motor  Fib head 2.3  < 4.0 7.2  > 4.0        Pop fossa 3.6  < 6.7 6.1 -  Pop fossa-Fib head 9 69  > 40   Left Median (APB) Motor  Wrist 3.3  < 3.9 8.1  > 6.0        Elbow 7.6 - 7.8 -  Elbow-Wrist 23 53  > 50   Left Tibial (AH) Motor  Ankle 3.6  < 6.0 16.8  > 8.0        Knee 11.1 - 11.7 -  Knee-Ankle 31 42  > 40   Left Ulnar (ADM) Motor  Wrist 2.2  < 3.1 12.3  > 7.0        Bel elbow 5.1 - 11.5 -  Bel elbow-Wrist 21 72  > 50   Ab elbow 6.5 - 10.8 -  Ab elbow-Bel elbow 10 71 -    Sensory Sites    Neg Peak Lat Amplitude (O-P) Segment Distance Velocity Comment  Site (ms) Norm (V) Norm  (cm) (ms)   Left Median Sensory  Wrist 3.4  < 3.4 32  > 20 Wrist-Dig II 13    Left Radial Sensory  Forearm-Wrist 1.98  < 2.7 25  > 18 Forearm-Wrist 10    Left Superficial Fibular Sensory  14  cm-Ankle 2.4  < 4.5 9  > 5 14 cm-Ankle 14    Left Sural Sensory  Calf-Lat mall 3.1  < 4.5 9  > 5 Calf-Lat mall 12    Left Ulnar Sensory  Wrist-Dig V 2.5  < 3.1 31  > 12 Wrist-Dig V 11     H-Reflex Results    M-Lat H Lat H Neg Amp H-M Lat  Site (ms) (ms) Norm (mV) (ms)  Left Tibial H-Reflex  Pop fossa 6.6 ---  < 35.0 --- ---   Electromyography   Side Muscle Ins.Act Fibs Fasc Recrt Amp Dur Poly Activation Comment  Left Tib ant Nml Nml Nml Nml Nml Nml Nml Nml N/A  Left Gastroc MH Nml Nml Nml Nml Nml Nml Nml Nml N/A  Left FDL Nml Nml Nml Nml Nml Nml Nml Nml N/A  Left EDB Nml Nml Nml *None *- *- *- *Variable N/A  Left AH Nml Nml Nml Nml Nml Nml Nml *Variable N/A  Left Vastus lat Nml Nml Nml Nml Nml Nml Nml Nml N/A  Left Biceps fem SH Nml Nml Nml Nml Nml Nml Nml Nml N/A  Left FDI Nml Nml Nml Nml Nml Nml Nml Nml N/A  Left EIP  Nml Nml Nml Nml Nml Nml Nml Nml N/A  Left APB Nml Nml Nml *1- *2+ *2+ *1+ Nml N/A  Left Pronator teres Nml Nml Nml Nml Nml Nml Nml Nml N/A  Left Biceps Nml Nml Nml Nml Nml Nml Nml Nml N/A  Left Triceps lat hd Nml Nml Nml Nml Nml Nml Nml Nml N/A  Left Deltoid Nml Nml Nml Nml Nml Nml Nml Nml N/A      Waveforms:  Motor             Sensory             H-Reflex

## 2023-08-19 ENCOUNTER — Ambulatory Visit: Payer: Medicaid Other | Admitting: Neurology

## 2023-08-19 ENCOUNTER — Telehealth: Payer: Self-pay | Admitting: Neurology

## 2023-08-19 ENCOUNTER — Ambulatory Visit (INDEPENDENT_AMBULATORY_CARE_PROVIDER_SITE_OTHER): Payer: Medicaid Other | Admitting: Neurology

## 2023-08-19 DIAGNOSIS — R202 Paresthesia of skin: Secondary | ICD-10-CM

## 2023-08-19 DIAGNOSIS — R200 Anesthesia of skin: Secondary | ICD-10-CM

## 2023-08-19 DIAGNOSIS — G5603 Carpal tunnel syndrome, bilateral upper limbs: Secondary | ICD-10-CM

## 2023-08-19 DIAGNOSIS — R209 Unspecified disturbances of skin sensation: Secondary | ICD-10-CM

## 2023-08-19 DIAGNOSIS — R52 Pain, unspecified: Secondary | ICD-10-CM

## 2023-08-19 DIAGNOSIS — M79672 Pain in left foot: Secondary | ICD-10-CM

## 2023-08-19 NOTE — Procedures (Signed)
[facility]  [address] [place], [address]  [place], [place] [address] Tel: [phone] Fax: [phone] Test Date:  08/19/2023  Patient: Jade Taylor DOB: [birth year] Physician: Jacquelyne Balint, MD  Sex: Female Height: 5\' 2"  Ref Phys: Jacquelyne Balint, MD  ID#: [id number]   Technician:    History: This is a 32 year old female with numbness and tingling of the hands with a history of bilateral carpal tunnel release surgery.  Findings: High frequency (4.0-16.0 MHz) B-mode, nonvascular ultrasound of the bilateral upper limbs shows: Cross sectional areas (CSA) of bilateral median (palm to mid upper arm) are increased at the palm and wrist bilaterally (left - 14.9 mm2 at palm and 22.9 mm2 at wrist; right - 15.6 mm2 at palm and 30.2 mm2 at wrist). Wrist to forearm ratios of bilateral median nerves are increased (left 3.18, right 5.12).  Impression: This is an abnormal neuromuscular ultrasound. The findings are most consistent with the following: Ultrasonographic evidence of entrapment of bilateral median nerves at the wrist segment, based on increased cross sectional area (CSA) at the level of the pisiform bone and an increased ratio comparing the CSA measurement of the median nerve at the wrist to the forearm, consistent with a clinical diagnosis of bilateral carpal tunnel syndrome. No other obvious lesion involving the adjacent bone or tendon is identified. No definite vascular abnormalities.   _______________  Jacquelyne Balint, MD [facility]   Nerve Measurements   Site Area Segment Area Ratio Mobility Vascularity Comment   mm Norm   Norm     Left Median  Palm 14.9         Wrist *22.9  < 13.0         Forearm 7.2  < 10.7  Wrist - Forearm *3.18  < 1.50      Pronator teres 6.8  < 11.0         Mid-arm 9.2  < 13.1         Right Median  Palm 15.6         Wrist *30.2  < 13.0         Forearm 5.9  < 10.7  Wrist - Forearm *5.12  < 1.50      Pronator teres 5.1  < 11.0          Mid-arm 8.2  < 13.1          Ultrasound Images:

## 2023-08-19 NOTE — Telephone Encounter (Signed)
Discussed the results of patient's neuromuscular ultrasound after the procedure today. It showed bilateral median nerve swelling in the palm and wrist, consistent with carpal tunnel syndrome.  Patient had bilateral release at the age of 59 with resolution of hand symptoms for many years until recent return of symptoms. She likely has new entrapment. This would not explain in the paresthesias in her lower extremities though. We did skin biopsy today looking for a small fiber neuropathy. The results should be back in the next month. We discussed and agreed to wait on these results before deciding if she needed to be sent back to hand surgery to discuss further intervention for CTS.  All questions were answered.  Jacquelyne Balint, MD [facility]

## 2023-08-19 NOTE — Progress Notes (Signed)
Punch Biopsy Procedure Note  Preprocedure Diagnosis: disturbance of skin sensation   Postprocedure Diagnosis: same  Locations: Site 1: right lateral distal leg;  Site 2: right lateral thigh;   Indications: r/o small fiber neuropathy  Anesthesia: 5 mL Lidocaine 1% with epinephrine  Procedure Details Patient informed of the risks (including but not limited to bleeding, pain, infection, scar and infection) and benefits of the procedure.  Informed consent obtained.  The areas which were chosen for biopsy, as above, and surrounding areas were given a sterile prep using alcohol and iodine. The skin was then stretched perpendicular to the skin tension lines and sample removed using the 3 mm punch. Pressure applied, hemostasis achieved.   Dressing applied. The specimen(s) was sent for pathologic examination. The patient tolerated the procedure well.  Estimated Blood Loss: 1 ml  Condition: Stable  Complications: none.  Plan: 1. Instructed to keep the wound dry and covered for 24h and clean thereafter. 2. Warning signs of infection were reviewed.    Jacquelyne Balint, MD [facility]

## 2023-08-21 ENCOUNTER — Encounter: Payer: Self-pay | Admitting: Neurology

## 2023-09-09 ENCOUNTER — Telehealth: Payer: Self-pay

## 2023-09-09 NOTE — Telephone Encounter (Signed)
-----   Message from Antony Madura sent at 09/08/2023  4:12 PM EDT ----- Regarding: Skin biopsy results Sheena Donegan,  Can you let patient know that her skin biopsy was normal? Based on EMG and skin biopsy, there is no evidence of neuropathy. I do not have a neurologic explanation for the symptoms in her legs. I did find evidence of carpal tunnel at both wrists, which she knew about.   Thank you,  Riki Rusk

## 2023-09-09 NOTE — Telephone Encounter (Signed)
Called pt and left a detailed message on her voice mail. Told her to call back with any questions or concerns.

## 2023-09-16 ENCOUNTER — Encounter: Payer: Self-pay | Admitting: Neurology

## 2023-09-17 ENCOUNTER — Telehealth: Payer: Self-pay | Admitting: Neurology

## 2023-09-17 NOTE — Telephone Encounter (Signed)
Called patient to discuss work up. Her EMG showed no significant abnormalities (maybe the residuals of carpal tunnel syndrome). Her NMUS showed bilateral swollen median nerves at the wrist, consistent with carpal tunnel syndrome. This could explain hand symptoms, but not symptoms in her legs. We then did skin biopsy for small fiber neuropathy, which was normal with no evidence of small fiber neuropathy. I explained that I did not have a good neurologic explanation for her symptoms. I recommended she continue to work with her PCP on her diffuse pain as this could be something like fibromyalgia or a rheumatologic disorder.  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 I will put in the referral as requested.  All questions were answered.  Jade Balint, MD [facility]

## 2023-09-18 ENCOUNTER — Telehealth: Payer: Self-pay

## 2023-09-18 ENCOUNTER — Other Ambulatory Visit: Payer: Self-pay

## 2023-09-18 DIAGNOSIS — G5603 Carpal tunnel syndrome, bilateral upper limbs: Secondary | ICD-10-CM

## 2023-09-18 NOTE — Telephone Encounter (Signed)
Ordered Ortho for carpal tunnel sent to [facility] 09/18/23

## 2023-09-22 ENCOUNTER — Encounter: Payer: Self-pay | Admitting: Neurology

## 2023-09-24 ENCOUNTER — Telehealth: Payer: Self-pay | Admitting: Orthopaedic Surgery

## 2023-09-24 NOTE — Telephone Encounter (Signed)
Received call from patient. She needs another copy of all medical records. Auth on file. Copy of all records ready for patient to pick up at [facility]. Patient aware to come to this location.

## 2023-10-15 ENCOUNTER — Encounter: Payer: Self-pay | Admitting: Orthopaedic Surgery

## 2023-10-15 ENCOUNTER — Ambulatory Visit: Payer: Medicaid Other | Admitting: Orthopaedic Surgery

## 2023-10-15 VITALS — BP 116/85 | HR 82 | Ht 62.0 in | Wt 259.0 lb

## 2023-10-15 DIAGNOSIS — M79642 Pain in left hand: Secondary | ICD-10-CM | POA: Diagnosis not present

## 2023-10-15 DIAGNOSIS — M7711 Lateral epicondylitis, right elbow: Secondary | ICD-10-CM

## 2023-10-15 DIAGNOSIS — M79641 Pain in right hand: Secondary | ICD-10-CM | POA: Diagnosis not present

## 2023-10-15 NOTE — Progress Notes (Signed)
Subjective:    Patient ID: Jade Taylor, female    DOB: 01-30-1991, 32 y.o.   MRN: [id number]  HPI She has a long complicated history of hand pain, numbness, foot pain, pain all over, rashes, skin changes.  She has been seen by Dr. Jacquelyne Balint at [facility] and has had multiple testing and procedures.  She has been seen by Dr. Janee Morn, hand surgeon and by her family doctor. She has been seen at [facility] for her skin problems.  Her main concern today is that her right elbow is very tender and that she has pain in the hands.  She has an appointment with Dr. Janee Morn tomorrow.  He injected both volar wrist at her last visit.  I do not have notes.  I do have notes from Dr. Loleta Chance.  She had a carpal tunnel procedure done by me on the right wrist on 03-30-2009.  She had a prior EMG by Dr. 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  She had surgery on the left also.  Recent EMGs by Dr. Loleta Chance showed no significant abnormalities.  NMUS showed bilateral swollen median nerves at the wrist consistent with carpal tunnel syndrome.    She has been on Neurontin, Cymbalta, and several NSAIDs.  Concern is that she may have fibromyalgia or rheumatologic disorder.  She is upset that she does not have a definitive diagnosis.  I told her she is still being evaluated.  I will need to get the records from all treating providers.  Her right elbow pain is located over the lateral epicondyle area.  She has pain with lifting objects or trying to squeeze a rag.  She denies trauma or redness.  She has pain in both hands and has difficulty in holding things.  She has no left elbow pain.   Review of Systems  Constitutional:  Positive for activity change.       Rash of both arms, lower legs.  Chronic.  Musculoskeletal:  Positive for arthralgias and myalgias.  For Review of Systems, all other systems  reviewed and are negative.  The following is a summary of the past history medically, past history surgically, known current medicines, social history and family history.  This information is gathered electronically by the computer from prior information and documentation.  I review this each visit and have found including this information at this point in the chart is beneficial and informative.   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 Hilda Lias   PTSD (post-traumatic stress disorder)     Past Surgical History:  Procedure Laterality Date   CARPAL TUNNEL RELEASE     CESAREAN SECTION N/A 01/23/2017   Procedure: CESAREAN SECTION;  Surgeon: Willodean Rosenthal, MD;  Location: [facility] BIRTHING SUITES;  Service: Obstetrics;  Laterality: N/A;   CESAREAN SECTION WITH BILATERAL TUBAL LIGATION N/A 12/21/2022   Procedure: REPEAT CESAREAN SECTION WITH BILATERAL TUBAL LIGATION;  Surgeon: Lazaro Arms, MD;  Location: [facility] LD ORS;  Service: Obstetrics;  Laterality: N/A;   CHOLECYSTECTOMY N/A 08/17/2018   Procedure: LAPAROSCOPIC CHOLECYSTECTOMY;  Surgeon: Franky Macho, MD;  Location: [facility] ORS;  Service: General;  Laterality: N/A;   WISDOM TOOTH EXTRACTION      Current Outpatient Medications on File Prior to Visit  Medication Sig Dispense  Refill   FEROSUL 325 (65 Fe) MG tablet Take 325 mg by mouth daily.     Vitamin D, Ergocalciferol, (DRISDOL) 1.25 MG (50000 UNIT) CAPS capsule Take 50,000 Units by mouth once a week.     ALPRAZolam (XANAX) 0.25 MG tablet Take 0.25 mg by mouth 2 (two) times daily as needed. (Patient not taking: Reported on 10/15/2023)     DULoxetine (CYMBALTA) 30 MG capsule Take 2 capsules (60 mg total) by mouth at bedtime. Take 1 capsule (30 mg) at bedtime for 1 week, then increase to 2 capsules (60 mg) thereafter (Patient not taking: Reported on 10/15/2023) 60 capsule 3   gabapentin  (NEURONTIN) 300 MG capsule Take 1 capsule (300 mg total) by mouth 2 (two) times daily. (Patient not taking: Reported on 10/15/2023) 60 capsule 11   phentermine 30 MG capsule Take 30 mg by mouth every morning. (Patient not taking: Reported on 10/15/2023)     Prenatal Vit-Fe Fumarate-FA (PRENATAL VITAMINS) 28-0.8 MG TABS Take 1 tablet by mouth daily. (Patient not taking: Reported on 10/15/2023)     No current facility-administered medications on file prior to visit.    Social History   Socioeconomic History   Marital status: Single    Spouse name: Not on file   Number of children: Not on file   Years of education: Not on file   Highest education level: Not on file  Occupational History   Not on file  Tobacco Use   Smoking status: Former    Current packs/day: 0.50    Average packs/day: 0.5 packs/day for 0.5 years (0.3 ttl pk-yrs)    Types: Cigarettes   Smokeless tobacco: Never  Vaping Use   Vaping status: Former  Substance and Sexual Activity   Alcohol use: Yes    Comment: occ   Drug use: Not Currently    Types: Marijuana    Comment: prior to pregnancy   Sexual activity: Yes    Birth control/protection: None  Other Topics Concern   Not on file  Social History Narrative   Are you right handed or left handed? Right   Are you currently employed ?    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 no   Who lives with you? children   What type of home do you live in: 1 story or 2 story? two    Caffeine 2 cups daily   Social Determinants of Health   Financial Resource Strain: Low Risk  (07/01/2022)   Overall Financial Resource Strain (CARDIA)    Difficulty of Paying Living Expenses: Not very hard  Food Insecurity: No Food Insecurity (12/21/2022)   Hunger Vital Sign    Worried About Running Out of Food in the Last Year: Never true    Ran Out of Food in the Last Year: Never true  Transportation Needs: No Transportation Needs (12/21/2022)   PRAPARE -  Administrator, Civil Service (Medical): No    Lack of Transportation (Non-Medical): No  Physical Activity: Sufficiently Active (07/01/2022)   Exercise Vital Sign    Days of Exercise per Week: 5 days    Minutes of Exercise per Session: 30 min  Stress: No Stress Concern Present (07/01/2022)   [employer] of Occupational Health - Occupational Stress Questionnaire    Feeling of Stress : Not at all  Social Connections: Moderately Isolated (07/01/2022)   Social Connection and Isolation Panel [NHANES]    Frequency of Communication with Friends and Family:  More than three times a week    Frequency of Social Gatherings with Friends and Family: Three times a week    Attends Religious Services: 1 to 4 times per year    Active Member of Clubs or Organizations: No    Attends Banker Meetings: Never    Marital Status: Never married  Intimate Partner Violence: Not At Risk (07/01/2022)   Humiliation, Afraid, Rape, and Kick questionnaire    Fear of Current or Ex-Partner: No    Emotionally Abused: No    Physically Abused: No    Sexually Abused: No    Family History  Problem Relation Age of Onset   Bipolar disorder Mother    Schizophrenia Mother    Diabetes Mother    Hypertension Mother    Kidney disease Mother    Stroke Father    Heart disease Maternal Grandmother    Heart disease Paternal Grandfather     BP 116/85   Pulse 82   Ht 5\' 2"  (1.575 m)   Wt 259 lb (117.5 kg)   BMI 47.37 kg/m   Body mass index is 47.37 kg/m.      Objective:   Physical Exam Vitals and nursing note reviewed. Exam conducted with a chaperone present.  Constitutional:      Appearance: She is well-developed.  HENT:     Head: Normocephalic and atraumatic.  Eyes:     Conjunctiva/sclera: Conjunctivae normal.     Pupils: Pupils are equal, round, and reactive to light.  Cardiovascular:     Rate and Rhythm: Normal rate and regular rhythm.  Pulmonary:     Effort: Pulmonary effort is  normal.  Abdominal:     Palpations: Abdomen is soft.  Musculoskeletal:       Arms:     Cervical back: Normal range of motion and neck supple.  Skin:    General: Skin is warm and dry.  Neurological:     Mental Status: She is alert and oriented to person, place, and time.     Cranial Nerves: No cranial nerve deficit.     Motor: No abnormal muscle tone.     Coordination: Coordination normal.     Deep Tendon Reflexes: Reflexes are normal and symmetric. Reflexes normal.  Psychiatric:        Behavior: Behavior normal.        Thought Content: Thought content normal.        Judgment: Judgment normal.           Assessment & Plan:   Encounter Diagnoses  Name Primary?   Lateral epicondylitis, right elbow Yes   Bilateral hand pain    I have told her to take two Aleve twice a day or three Advil three times a day.  I have told her about ice massage.  I need a list of providers she has seen.  I will need to review the notes.  Keep her hand surgeon appointment tomorrow.  Return in two weeks.  Call if any problem.  Precautions discussed.  Electronically Signed Darreld Mclean, MD 11/20/202411:41 AM

## 2023-10-22 IMAGING — DX DG CHEST 1V PORT
1 series · 1 of 1 positions shown · non-contrast
Comparison: 01/24/2019.

CLINICAL DATA: Right upper quadrant pain for the past week. Pain
worsening today with nausea.

EXAM:
PORTABLE CHEST 1 VIEW

[chest ap]
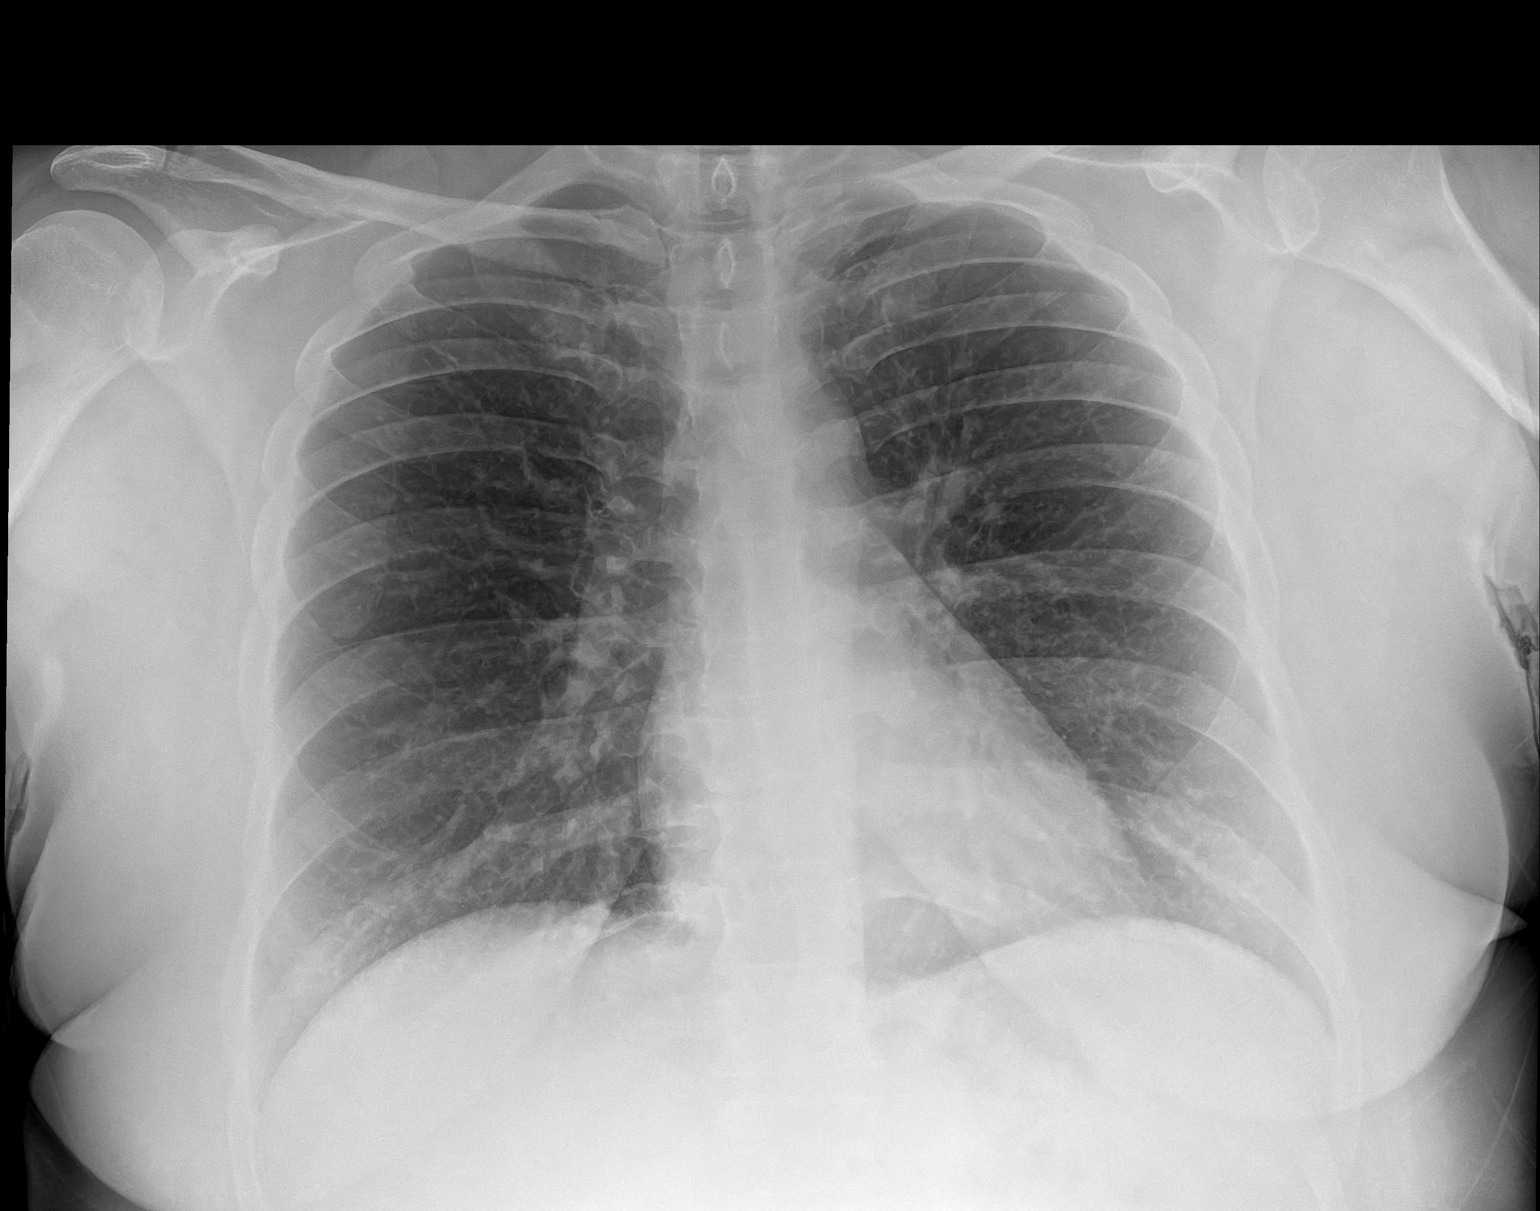

[1 of 1 positions shown; findings below may reference images not displayed]

FINDINGS: Normal heart, mediastinum and hila.

Clear lungs.  No pleural effusion or pneumothorax.

Skeletal structures are intact.
IMPRESSION: No active disease.

## 2023-10-30 ENCOUNTER — Encounter: Payer: Self-pay | Admitting: Orthopaedic Surgery

## 2023-10-30 ENCOUNTER — Ambulatory Visit: Payer: Medicaid Other | Admitting: Orthopaedic Surgery

## 2023-10-30 DIAGNOSIS — M7711 Lateral epicondylitis, right elbow: Secondary | ICD-10-CM

## 2023-10-30 DIAGNOSIS — M79642 Pain in left hand: Secondary | ICD-10-CM | POA: Diagnosis not present

## 2023-10-30 DIAGNOSIS — M79641 Pain in right hand: Secondary | ICD-10-CM

## 2023-10-30 NOTE — Progress Notes (Signed)
My elbow is better.  She has less pain over the right lateral epicondyle. She is doing the ice massage.  She saw the hand surgeon who says she might need surgery.  He wants her to decide.  She saw dermatologist who recommends Humeria for her skin condition.  It may also help her other issues too.  She needs to get permission from her insurance company.  Right elbow is not that tender today.  NV intact.  Grips are better.  Encounter Diagnoses  Name Primary?   Lateral epicondylitis, right elbow Yes   Bilateral hand pain    I will see her in six weeks.  Continue the ice massage.  Call if any problem.  Precautions discussed.  Electronically Signed Darreld Mclean, MD 12/5/20249:44 AM

## 2023-11-18 ENCOUNTER — Encounter (HOSPITAL_COMMUNITY): Payer: Self-pay | Admitting: Psychiatry

## 2023-11-18 ENCOUNTER — Ambulatory Visit (HOSPITAL_COMMUNITY): Payer: Medicaid Other | Admitting: Psychiatry

## 2023-11-18 DIAGNOSIS — F411 Generalized anxiety disorder: Secondary | ICD-10-CM | POA: Diagnosis not present

## 2023-11-18 DIAGNOSIS — E559 Vitamin D deficiency, unspecified: Secondary | ICD-10-CM | POA: Insufficient documentation

## 2023-11-18 DIAGNOSIS — F5104 Psychophysiologic insomnia: Secondary | ICD-10-CM | POA: Insufficient documentation

## 2023-11-18 DIAGNOSIS — G894 Chronic pain syndrome: Secondary | ICD-10-CM

## 2023-11-18 DIAGNOSIS — F4000 Agoraphobia, unspecified: Secondary | ICD-10-CM

## 2023-11-18 DIAGNOSIS — F410 Panic disorder [episodic paroxysmal anxiety] without agoraphobia: Secondary | ICD-10-CM

## 2023-11-18 DIAGNOSIS — F1290 Cannabis use, unspecified, uncomplicated: Secondary | ICD-10-CM | POA: Diagnosis not present

## 2023-11-18 DIAGNOSIS — D509 Iron deficiency anemia, unspecified: Secondary | ICD-10-CM | POA: Insufficient documentation

## 2023-11-18 MED ORDER — FLUOXETINE HCL 10 MG PO CAPS: 10.0000 mg | ORAL_CAPSULE | Freq: Every day | ORAL | 1 refills | Status: DC

## 2023-11-18 NOTE — Progress Notes (Signed)
Psychiatric Initial Adult Assessment  Patient Identification: Jade Taylor MRN:  [id number] Date of Evaluation:  11/18/2023 Referral Source: PCP  Assessment:  Jade Taylor is a 32 y.o. female with a history of Generalized anxiety disorder with panic attacks, agoraphobia, Psychophysiologic insomnia with snoring, restless legs, and nighttime caffeine use, iron deficiency, cannabis use disorder, chronic muscle and joint pain, obesity on Wegovy who presents to [facility] via video conferencing for initial evaluation of anxiety and anger.  Patient reported mother who had either bipolar disorder or schizophrenia and stress related to this but while she carried a diagnosis of PTSD denied any traumatic experiences in initial diagnostic interview.  She did qualify for generalized anxiety disorder with panic attacks and agoraphobia settling in secondary to this and PCP had prescribed Xanax and told patient that we would not be continuing this prescription for her in our clinic.  She has been using sporadically but also has sporadic cannabis use.  The latter could explain some of the difficulty controlling anger that she experiences.  It is possible that she does have fibromyalgia but she does not want to take Cymbalta with the recent recall for carcinogenic supposed her to that medication.  She has not tried SSRIs before so we will start with Prozac as next trial.  She will try to cut out past 12 PM caffeine use which should help sleep and she will coordinate with PCP for possible sleep study for snoring.  She was already on iron supplement but may need an iron level checked for the restless legs.  We will coordinate with PCP to obtain thyroid studies.  She will contact insurer for therapist that is in network.  Follow-up in 1 month.  For safety, her acute risk factors for suicide are: Cannabis use, prescription benzodiazepines.  Her chronic risk factors for suicide are: Single  parent, cannabis use disorder, chronic mental illness, chronic pain unemployed.  Her protective factors are: No access to firearms, actively seeking engaging with mental health care, minor children living in the home, supportive family, no suicidal ideation in session today.  While future events cannot be fully predicted she does not currently meet IVC criteria and can be continued as an outpatient.  Plan:  # Generalized anxiety disorder with panic attacks  agoraphobia Past medication trials: See medication trials below Status of problem: New to provider Interventions: -- Start Prozac 10 mg once daily (s12/24/24) -- Patient to contact insurer for therapist that is in network -- Patient cut back on caffeine use -- Coordinate with PCP for TSH, total T3, free T4  # Psychophysiologic insomnia with snoring and restless legs and nighttime caffeine use Past medication trials:  Status of problem: New to provider Interventions: -- Patient to cut back on caffeine use --Continue to encourage abstinence from marijuana --Patient coordinate with PCP for possible sleep study and iron panel  # Cannabis use disorder Past medication trials:  Status of problem: New to provider Interventions: -- Continue to encourage abstinence  # Chronic muscle and joint pain and carrier of spinal muscular atrophy Past medication trials:  Status of problem: New to provider Interventions: -- Continue to monitor and consider Depakote --On Humira  # Obesity on Wegovy  history of iron deficiency and vitamin D deficiency Past medication trials:  Status of problem: New to provider Interventions: -- Coordinate with PCP for iron panel, vitamin D, B12, folate level and consider nutrition referral  Patient was given contact information for behavioral health clinic and was  instructed to call 911 for emergencies.   Subjective:  Chief Complaint:  Chief Complaint  Patient presents with   Anxiety   anger   Establish  Care   Panic Attack    History of Present Illness:  Goes by Jade Taylor. Uncertain if angry or sad but gets emotional when talking to people.  Lives with her kids (1, 7), no pets. Father of children lives in [place] but are cordial. Having health issues right now, carpal tunnel in her hands having previously had surgery at age 3 and will need repeat. Can't work currently. Grandmother/grandfather left her property which she rents. Father of children provides stipend as well. For fun, likes to go to her childrens' schools to help in the classroom, taking kids to park. Still enjoys. Trouble going to sleep, 6-7hrs per night, can also wake during the night. Snores with no sleep study. No vivid dreams or nightmares. Restless legs. Drinks tea, 2 glasses per day. Soda more sporadic. Last tea is before bed. Appetite is usually 2 meals with no breakfast, sometimes 3 sometimes 1 and will snacks. No binge episodes. Denies intentional restriction. No purging. Concentration impairment lifelong. Did ok in school, never got in trouble for concentration in school. Fidgety. Not struggling with guilt. No SI past or present. No significant changes in the lead up to menses with mood symptoms.  Chronic worry across multiple domains with impact on sleep and muscle tension. Panic attacks happen multiple times per day. No fear of next panic attack. Avoids crowds but is a social person but has been staying at home more due to anxiety. Has switched to online shopping primarily. No period of sleeplessness or stretches of high energy. No hallucinations. No paranoia.   Alcohol is monthly and may have a margarita or one drink socially. Quit smoking in 2022 when she was pregnant. Smoked marijuana in the past but will still have randomly, last 1 month ago and will be a blunt at a time. Helps to mellow.   No inner emptiness, difficulty controlling anger, no dissociation, no rapid escalation of new relationships, no impulsivity.    Past  Psychiatric History:  Diagnoses: fibromyalgia, anxiety, PTSD, iron deficiency Medication trials: cymbalta (stopped after 2 weeks due to carcinogen risk), hydroxyzine, xanax, gabapentin (ineffective), phentermine (ineffective) Previous psychiatrist/therapist: none Hospitalizations: none Suicide attempts: none SIB: none Hx of violence towards others: none Current access to guns: none Hx of trauma/abuse: none  Previous Psychotropic Medications: Yes   Substance Abuse History in the last 12 months:  Yes.    Past Medical History:  Past Medical History:  Diagnosis Date   Asthma    Chronic pain syndrome    History of narcotic use    Hydrocodone: 120/month for years; Dr. Hilda Lias   PTSD (post-traumatic stress disorder)     Past Surgical History:  Procedure Laterality Date   CARPAL TUNNEL RELEASE     CESAREAN SECTION N/A 01/23/2017   Procedure: CESAREAN SECTION;  Surgeon: Willodean Rosenthal, MD;  [facility];  Service: Obstetrics;  Laterality: N/A;   CESAREAN SECTION WITH BILATERAL TUBAL LIGATION N/A 12/21/2022   Procedure: REPEAT CESAREAN SECTION WITH BILATERAL TUBAL LIGATION;  Surgeon: Lazaro Arms, MD;  Location: [facility] LD ORS;  Service: Obstetrics;  Laterality: N/A;   CHOLECYSTECTOMY N/A 08/17/2018   Procedure: LAPAROSCOPIC CHOLECYSTECTOMY;  Surgeon: Franky Macho, MD;  Location: [facility] ORS;  Service: General;  Laterality: N/A;   WISDOM TOOTH EXTRACTION      Family Psychiatric History: mother bipolar or schizophrenic,  not sure of diagnosis  Family History:  Family History  Problem Relation Age of Onset   Bipolar disorder Mother    Schizophrenia Mother    Diabetes Mother    Hypertension Mother    Kidney disease Mother    Stroke Father    Heart disease Maternal Grandmother    Heart disease Paternal Grandfather     Social History:   Academic/Vocational: unemployed  Social History   Socioeconomic History   Marital status: Single    Spouse name: Not on file    Number of children: Not on file   Years of education: Not on file   Highest education level: Not on file  Occupational History   Not on file  Tobacco Use   Smoking status: Former    Current packs/day: 0.50    Average packs/day: 0.5 packs/day for 0.5 years (0.3 ttl pk-yrs)    Types: Cigarettes   Smokeless tobacco: Never  Vaping Use   Vaping status: Former  Substance and Sexual Activity   Alcohol use: Yes    Comment: Socially will have 1 drink when consuming   Drug use: Yes    Types: Marijuana    Comment: Sporadic use of blunt   Sexual activity: Yes    Birth control/protection: None  Other Topics Concern   Not on file  Social History Narrative   Are you right handed or left handed? Right   Are you currently employed ?    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 no   Who lives with you? children   What type of home do you live in: 1 story or 2 story? two    Caffeine 2 cups daily   Social Drivers of Health   Financial Resource Strain: Low Risk  (07/01/2022)   Overall Financial Resource Strain (CARDIA)    Difficulty of Paying Living Expenses: Not very hard  Food Insecurity: No Food Insecurity (12/21/2022)   Hunger Vital Sign    Worried About Running Out of Food in the Last Year: Never true    Ran Out of Food in the Last Year: Never true  Transportation Needs: No Transportation Needs (12/21/2022)   PRAPARE - Administrator, Civil Service (Medical): No    Lack of Transportation (Non-Medical): No  Physical Activity: Sufficiently Active (07/01/2022)   Exercise Vital Sign    Days of Exercise per Week: 5 days    Minutes of Exercise per Session: 30 min  Stress: No Stress Concern Present (07/01/2022)   [employer] of Occupational Health - Occupational Stress Questionnaire    Feeling of Stress : Not at all  Social Connections: Moderately Isolated (07/01/2022)   Social Connection and Isolation Panel [NHANES]    Frequency of Communication with  Friends and Family: More than three times a week    Frequency of Social Gatherings with Friends and Family: Three times a week    Attends Religious Services: 1 to 4 times per year    Active Member of Clubs or Organizations: No    Attends Banker Meetings: Never    Marital Status: Never married    Additional Social History: updated  Allergies:   Allergies  Allergen Reactions   Metronidazole Hives, Shortness Of Breath, Rash and Other (See Comments)    Rash, Burning sensation- was hospitalized   Penicillins Shortness Of Breath, Itching and Rash    Has patient had a PCN reaction causing immediate rash, facial/tongue/throat swelling, SOB  or lightheadedness with hypotension: No Has patient had a PCN reaction causing severe rash involving mucus membranes or skin necrosis: No Has patient had a PCN reaction that required hospitalization Yes Has patient had a PCN reaction occurring within the last 10 years: No If all of the above answers are "NO", then may proceed with Cephalosporin use.    Amoxicillin Hives    Has patient had a PCN reaction causing immediate rash, facial/tongue/throat swelling, SOB or lightheadedness with hypotension: Yes Has patient had a PCN reaction causing severe rash involving mucus membranes or skin necrosis: No Has patient had a PCN reaction that required hospitalization No Has patient had a PCN reaction occurring within the last 10 years: No If all of the above answers are "NO", then may proceed with Cephalosporin use.    Tape Rash    Surgical tape, bandaids    Current Medications: Current Outpatient Medications  Medication Sig Dispense Refill   FLUoxetine (PROZAC) 10 MG capsule Take 1 capsule (10 mg total) by mouth daily. 30 capsule 1   HUMIRA-CD/UC/HS STARTER 80 MG/0.8ML pen Inject into the skin. 80 mg/0.81ml AJKT     WEGOVY 0.25 MG/0.5ML SOAJ Inject 0.25 mg into the skin once a week.     ALPRAZolam (XANAX) 0.25 MG tablet Take 0.25 mg by mouth 2  (two) times daily as needed.     FEROSUL 325 (65 Fe) MG tablet Take 325 mg by mouth daily.     Vitamin D, Ergocalciferol, (DRISDOL) 1.25 MG (50000 UNIT) CAPS capsule Take 50,000 Units by mouth once a week.     No current facility-administered medications for this visit.    ROS: Review of Systems  Constitutional:  Positive for appetite change. Negative for unexpected weight change.  Gastrointestinal:  Negative for constipation, diarrhea, nausea and vomiting.  Endocrine: Positive for cold intolerance and heat intolerance. Negative for polyphagia.  Musculoskeletal:  Positive for arthralgias, joint swelling and myalgias.  Skin:        Hair loss  Neurological:  Positive for dizziness and headaches.  Psychiatric/Behavioral:  Positive for decreased concentration and sleep disturbance. Negative for dysphoric mood, hallucinations, self-injury and suicidal ideas. The patient is nervous/anxious.     Objective:  Psychiatric Specialty Exam: not currently breastfeeding.There is no height or weight on file to calculate BMI.  General Appearance: Casual, Fairly Groomed, and appears stated age.  Tattoos present  Eye Contact:  Good  Speech:  Clear and Coherent and Normal Rate  Volume:  Normal  Mood:   "I start to cry when I talk to people"  Affect:  Appropriate, Congruent, Tearful, and anxious  Thought Content: Logical and Hallucinations: None   Suicidal Thoughts:  No  Homicidal Thoughts:  No  Thought Process:  Coherent, Goal Directed, and Linear  Orientation:  Full (Time, Place, and Person)    Memory: Grossly intact   Judgment:  Fair  Insight:  Fair  Concentration:  Concentration: Fair and Attention Span: Fair  Recall:  not formally assessed   Fund of Knowledge: Fair  Language: Fair  Psychomotor Activity:  Increased and fidgety  Akathisia:  No  AIMS (if indicated): not done  Assets:  Manufacturing systems engineer Desire for Improvement Financial Resources/Insurance Housing Leisure  Time Resilience Social Support Transportation  ADL's:  Impaired  Cognition: WNL  Sleep:  Poor   PE: General: sits comfortably in view of camera; actively crying throughout appointment Pulm: no increased work of breathing on room air  MSK: all extremity movements appear intact  Neuro: no  focal neurological deficits observed  Gait & Station: unable to assess by video    Metabolic Disorder Labs: Lab Results  Component Value Date   HGBA1C 5.3 07/01/2022   No results found for: "PROLACTIN" No results found for: "CHOL", "TRIG", "HDL", "CHOLHDL", "VLDL", "LDLCALC" No results found for: "TSH"  Therapeutic Level Labs: No results found for: "LITHIUM" No results found for: "CBMZ" No results found for: "VALPROATE"  Screenings:  GAD-7    Flowsheet Row Initial Prenatal from 07[facility]01/2022 in [facility] at [facility] Office Visit from 02/20/2022 in [facility] for [facility] at [facility] Office Visit from 07/03/2020 in [facility] for [facility] at [facility]  Total GAD-7 Score 0 6 4      PHQ2-9    Flowsheet Row Office Visit from 11/18/2023 in [facility] [place] Initial Prenatal from 07/01/2022 in [facility] for [facility] Healthcare at [facility] Office Visit from 02/20/2022 in [facility] for [facility] at [facility] Office Visit from 07/03/2020 in [facility] for [facility] at [facility] Initial Prenatal from 07/10/2016 in [facility]  PHQ-2 Total Score 0 0 0 0 0  PHQ-9 Total Score -- 1 6 3 1       Flowsheet Row Office Visit from 11/18/2023 in [facility] at [place] ED from 07/02/2023 in [facility] at [facility] Admission (Discharged) from 12/21/2022 in [place] 5S Mother Baby Unit  C-SSRS RISK CATEGORY No Risk No Risk No Risk       Collaboration of Care: Collaboration of Care: Medication Management AEB as above,  Primary Care Provider AEB as above, and Referral or follow-up with counselor/therapist AEB as above  Patient/Guardian was advised Release of Information must be obtained prior to any record release in order to collaborate their care with an outside provider. Patient/Guardian was advised if they have not already done so to contact the registration department to sign all necessary forms in order for [place] to release information regarding their care.   Consent: Patient/Guardian gives verbal consent for treatment and assignment of benefits for services provided during this visit. Patient/Guardian expressed understanding and agreed to proceed.   Televisit via video: I connected with Jade Taylor on 11/18/23 at  9:00 AM EST by a video enabled telemedicine application and verified that I am speaking with the correct person using two identifiers.  Location: Patient: home in [place] Provider: home office   I discussed the limitations of evaluation and management by telemedicine and the availability of in person appointments. The patient expressed understanding and agreed to proceed.  I discussed the assessment and treatment plan with the patient. The patient was provided an opportunity to ask questions and all were answered. The patient agreed with the plan and demonstrated an understanding of the instructions.   The patient was advised to call back or seek an in-person evaluation if the symptoms worsen or if the condition fails to improve as anticipated.  I provided 60 minutes dedicated to the care of this patient via video on the date of this encounter to include chart review, face-to-face time with the patient, medication management/counseling, coordination of care with primary care provider.  Elsie Lincoln, MD 12/24/20249:53 AM

## 2023-11-18 NOTE — Patient Instructions (Signed)
We added Prozac (fluoxetine) 10 mg once daily to your regimen today.  This should begin to help with your anxiety and some of the anger.  You would likely also benefit from starting psychotherapy so please reach out to your insurer to find a therapist that is in network that practices CBT (cognitive behavioral therapy).  For your sleep, you may want to discuss with your primary care provider about getting a sleep study to rule out sleep apnea but cutting out the caffeine after 12 PM will also help being able to fall asleep and stay asleep.  You may also want to touch base with your PCP about checking a vitamin B12, folate, iron panel, vitamin D level, as well as a TSH with total T3 and free T4 to rule out any physical causes for your anxiety.

## 2023-11-27 ENCOUNTER — Encounter: Payer: Self-pay | Admitting: Orthopaedic Surgery

## 2023-12-11 ENCOUNTER — Ambulatory Visit: Payer: Medicaid Other | Admitting: Orthopaedic Surgery

## 2023-12-18 ENCOUNTER — Telehealth (HOSPITAL_COMMUNITY): Payer: Medicaid Other | Admitting: Psychiatry

## 2023-12-18 ENCOUNTER — Encounter (HOSPITAL_COMMUNITY): Payer: Self-pay | Admitting: Psychiatry

## 2023-12-18 DIAGNOSIS — E559 Vitamin D deficiency, unspecified: Secondary | ICD-10-CM

## 2023-12-18 DIAGNOSIS — F411 Generalized anxiety disorder: Secondary | ICD-10-CM | POA: Diagnosis not present

## 2023-12-18 DIAGNOSIS — F4000 Agoraphobia, unspecified: Secondary | ICD-10-CM

## 2023-12-18 DIAGNOSIS — F410 Panic disorder [episodic paroxysmal anxiety] without agoraphobia: Secondary | ICD-10-CM

## 2023-12-18 DIAGNOSIS — D509 Iron deficiency anemia, unspecified: Secondary | ICD-10-CM | POA: Diagnosis not present

## 2023-12-18 DIAGNOSIS — F5104 Psychophysiologic insomnia: Secondary | ICD-10-CM

## 2023-12-18 DIAGNOSIS — F4001 Agoraphobia with panic disorder: Secondary | ICD-10-CM | POA: Diagnosis not present

## 2023-12-18 MED ORDER — FLUOXETINE HCL 20 MG PO CAPS: 20.0000 mg | ORAL_CAPSULE | Freq: Every day | ORAL | 2 refills | Status: DC

## 2023-12-18 NOTE — Patient Instructions (Signed)
We increased the Prozac (fluoxetine) to 20 mg once daily today.  It is okay to take the 10 mg capsules and just take 2 of them to get the 20 mg dose since you just picked it up.

## 2023-12-18 NOTE — Progress Notes (Signed)
Psychiatric Initial Adult Assessment  Patient Identification: Jade Taylor MRN:  [id number] Date of Evaluation:  12/18/2023 Referral Source: PCP  Assessment:  [name] is an established patient presenting for follow-up video conferencing appointment.  Today, 12/18/23, patient reports still rapid initiation of crying and tearfulness whenever she is speaking to another person which was still the case in session today.  However she is noting improvement to anxiety and depression specifically with racing thoughts before bed and is able to get slightly better sleep.  This is unfortunately still limited by ongoing arm pain and she is trying to hold off on surgery until her daughter is able to walk on her own.  Prozac does appear to be having an effect so we will titrate today as she is not having side effects.  Appetite is still relatively low with combination of Humira and Wegovy.  Vitamin D and iron were still somewhat low so she is continuing those supplements for now.  Ultimately discontinued Xanax as it was causing more problems than benefit.  Has been abstinent from cannabis as well. Follow-up in 1 month.  For safety, her acute risk factors for suicide are: Current diagnosis of agoraphobia in the morgue in the home.  Her chronic risk factors for suicide are: Single parent, history of cannabis use disorder, chronic mental illness, chronic pain, unemployed.  Her protective factors are: No access to firearms, actively seeking engaging with mental health care, minor children living in the home, supportive family, no suicidal ideation in session today.  While future events cannot be fully predicted she does not currently meet IVC criteria and can be continued as an outpatient.  Identifying information: Jade Taylor is a 33 y.o. female with a history of Generalized anxiety disorder with panic attacks, agoraphobia, Psychophysiologic insomnia with snoring, restless legs, and nighttime  caffeine use, iron deficiency, cannabis use disorder, chronic muscle and joint pain, obesity on Wegovy who presents to [facility] via video conferencing for initial evaluation of anxiety and anger on 11/18/2023; please see that note for full case formulation.    Plan:  # Generalized anxiety disorder with panic attacks  agoraphobia Past medication trials: See medication trials below Status of problem: improving Interventions: -- Titrate Prozac to 20 mg once daily (s12/24/24, i1/23/25) -- Patient to contact insurer for therapist that is in network -- Patient cut back on caffeine use -- Coordinate with PCP for TSH, total T3, free T4  # Psychophysiologic insomnia with snoring and restless legs Past medication trials:  Status of problem: improving Interventions: -- Patient to cut back on caffeine use --Continue to encourage abstinence from marijuana --Patient coordinate with PCP for possible sleep study and iron panel  # Cannabis use disorder Past medication trials:  Status of problem: not currently active Interventions: -- Continue to encourage abstinence  # Chronic muscle and joint pain and carrier of spinal muscular atrophy Past medication trials:  Status of problem: chronic and stable Interventions: -- Continue to monitor and consider Depakote --On Humira  # Obesity on Wegovy  history of iron deficiency and vitamin D deficiency Past medication trials:  Status of problem: chronic and stable Interventions: -- Continue vitamin D and iron supplement per PCP  Patient was given contact information for behavioral health clinic and was instructed to call 911 for emergencies.   Subjective:  Chief Complaint:  Chief Complaint  Patient presents with   Anxiety   Depression   Follow-up   Trauma   Panic Attack  Stress    History of Present Illness:  Goes by Jade Taylor. Things have been about the same, feeling ok and doesn't think she is freaking out. Mostly  holding steady. Does think that the prozac has helped a little bit but still feeling irritable. Thinks of her two stillborn children frequently and remembers another time when she went to a dying man's house in childhood and got in trouble for it. Tearful and crying when discussing this. Doesn't think having side effects to prozac. Still cannot speak to others without crying.  Is better able to fall asleep but will still wake during the night but not up for as long before. A lot of it is related to ongoing arm pain. Waiting for her daughter being able to walk before proceeding with surgery. Still getting 6-7hrs per night. Still with restless legs. Drinks tea, but no longer after 12p unless going out to eat. Still 2 glasses per day. Soda more sporadic. Appetite has decreased with the wegovy but trying to do 2-3 small meals. Panic attacks still happen daily most frequently at night. Alcohol is monthly and may have a margarita or one drink socially.    Past Psychiatric History:  Diagnoses: Generalized anxiety disorder with panic attacks, agoraphobia, Psychophysiologic insomnia with snoring, restless legs, and nighttime caffeine use, iron deficiency, cannabis use disorder, chronic muscle and joint pain Medication trials: cymbalta (stopped after 2 weeks due to carcinogen risk), hydroxyzine, xanax, gabapentin (ineffective), phentermine (ineffective), Prozac (effective) Previous psychiatrist/therapist: none Hospitalizations: none Suicide attempts: none SIB: none Hx of violence towards others: none Current access to guns: none Hx of trauma/abuse: none  Previous Psychotropic Medications: Yes   Substance Abuse History in the last 12 months:  Yes.    Past Medical History:  Past Medical History:  Diagnosis Date   Asthma    Chronic pain syndrome    History of narcotic use    Hydrocodone: 120/month for years; Dr. Hilda Lias   PTSD (post-traumatic stress disorder)     Past Surgical History:  Procedure  Laterality Date   CARPAL TUNNEL RELEASE     CESAREAN SECTION N/A 01/23/2017   Procedure: CESAREAN SECTION;  Surgeon: Willodean Rosenthal, MD;  Location: [facility];  Service: Obstetrics;  Laterality: N/A;   CESAREAN SECTION WITH BILATERAL TUBAL LIGATION N/A 12/21/2022   Procedure: REPEAT CESAREAN SECTION WITH BILATERAL TUBAL LIGATION;  Surgeon: Lazaro Arms, MD;  Location: [facility];  Service: Obstetrics;  Laterality: N/A;   CHOLECYSTECTOMY N/A 08/17/2018   Procedure: LAPAROSCOPIC CHOLECYSTECTOMY;  Surgeon: Franky Macho, MD;  Location: [facility];  Service: General;  Laterality: N/A;   WISDOM TOOTH EXTRACTION      Family Psychiatric History: mother bipolar or schizophrenic, not sure of diagnosis  Family History:  Family History  Problem Relation Age of Onset   Bipolar disorder Mother    Schizophrenia Mother    Diabetes Mother    Hypertension Mother    Kidney disease Mother    Stroke Father    Heart disease Maternal Grandmother    Heart disease Paternal Grandfather     Social History:   Academic/Vocational: unemployed  Social History   Socioeconomic History   Marital status: Single    Spouse name: Not on file   Number of children: Not on file   Years of education: Not on file   Highest education level: Not on file  Occupational History   Not on file  Tobacco Use   Smoking status: Former    Current packs/day: 0.50  Average packs/day: 0.5 packs/day for 0.5 years (0.3 ttl pk-yrs)    Types: Cigarettes   Smokeless tobacco: Never  Vaping Use   Vaping status: Former  Substance and Sexual Activity   Alcohol use: Yes    Comment: Socially will have 1 drink when consuming   Drug use: Not Currently    Types: Marijuana    Comment: Sporadic use of blunt   Sexual activity: Yes    Birth control/protection: None  Other Topics Concern   Not on file  Social History Narrative   Are you right handed or left handed? Right   Are you currently employed ?    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 no   Who lives with you? children   What type of home do you live in: 1 story or 2 story? two    Caffeine 2 cups daily   Social Drivers of Health   Financial Resource Strain: Low Risk  (07/01/2022)   Overall Financial Resource Strain (CARDIA)    Difficulty of Paying Living Expenses: Not very hard  Food Insecurity: No Food Insecurity (12/21/2022)   Hunger Vital Sign    Worried About Running Out of Food in the Last Year: Never true    Ran Out of Food in the Last Year: Never true  Transportation Needs: No Transportation Needs (12/21/2022)   PRAPARE - Administrator, Civil Service (Medical): No    Lack of Transportation (Non-Medical): No  Physical Activity: Sufficiently Active (07/01/2022)   Exercise Vital Sign    Days of Exercise per Week: 5 days    Minutes of Exercise per Session: 30 min  Stress: No Stress Concern Present (07/01/2022)   [employer] of Occupational Health - Occupational Stress Questionnaire    Feeling of Stress : Not at all  Social Connections: Moderately Isolated (07/01/2022)   Social Connection and Isolation Panel [NHANES]    Frequency of Communication with Friends and Family: More than three times a week    Frequency of Social Gatherings with Friends and Family: Three times a week    Attends Religious Services: 1 to 4 times per year    Active Member of Clubs or Organizations: No    Attends Banker Meetings: Never    Marital Status: Never married    Additional Social History: updated  Allergies:   Allergies  Allergen Reactions   Metronidazole Hives, Shortness Of Breath, Rash and Other (See Comments)    Rash, Burning sensation- was hospitalized   Penicillins Shortness Of Breath, Itching and Rash    Has patient had a PCN reaction causing immediate rash, facial/tongue/throat swelling, SOB or lightheadedness with hypotension: No Has patient had a PCN reaction causing severe rash involving  mucus membranes or skin necrosis: No Has patient had a PCN reaction that required hospitalization Yes Has patient had a PCN reaction occurring within the last 10 years: No If all of the above answers are "NO", then may proceed with Cephalosporin use.    Amoxicillin Hives    Has patient had a PCN reaction causing immediate rash, facial/tongue/throat swelling, SOB or lightheadedness with hypotension: Yes Has patient had a PCN reaction causing severe rash involving mucus membranes or skin necrosis: No Has patient had a PCN reaction that required hospitalization No Has patient had a PCN reaction occurring within the last 10 years: No If all of the above answers are "NO", then may proceed with Cephalosporin use.  Tape Rash    Surgical tape, bandaids    Current Medications: Current Outpatient Medications  Medication Sig Dispense Refill   HYDROcodone-acetaminophen (NORCO/VICODIN) 5-325 MG tablet Take 1 tablet by mouth every 4 (four) hours.     FEROSUL 325 (65 Fe) MG tablet Take 325 mg by mouth daily.     FLUoxetine (PROZAC) 20 MG capsule Take 1 capsule (20 mg total) by mouth daily. 30 capsule 2   HUMIRA-CD/UC/HS STARTER 80 MG/0.8ML pen Inject into the skin. 80 mg/0.34ml AJKT     Vitamin D, Ergocalciferol, (DRISDOL) 1.25 MG (50000 UNIT) CAPS capsule Take 50,000 Units by mouth once a week.     WEGOVY 0.25 MG/0.5ML SOAJ Inject 0.25 mg into the skin once a week.     No current facility-administered medications for this visit.    ROS: Review of Systems  Constitutional:  Positive for appetite change. Negative for unexpected weight change.  Gastrointestinal:  Negative for constipation, diarrhea, nausea and vomiting.  Endocrine: Positive for cold intolerance and heat intolerance. Negative for polyphagia.  Musculoskeletal:  Positive for arthralgias, joint swelling and myalgias.  Skin:        Hair loss  Neurological:  Positive for dizziness and headaches.  Psychiatric/Behavioral:  Positive for  decreased concentration and sleep disturbance. Negative for dysphoric mood, hallucinations, self-injury and suicidal ideas. The patient is nervous/anxious.     Objective:  Psychiatric Specialty Exam: not currently breastfeeding.There is no height or weight on file to calculate BMI.  General Appearance: Casual, Fairly Groomed, and appears stated age.  Tattoos present  Eye Contact:  Good  Speech:  Clear and Coherent and Normal Rate  Volume:  Normal  Mood:   "I still cry when I talk to people but maybe a little better"  Affect:  Appropriate, Congruent, Tearful, and anxious  Thought Content: Logical and Hallucinations: None   Suicidal Thoughts:  No  Homicidal Thoughts:  No  Thought Process:  Coherent, Goal Directed, and Linear  Orientation:  Full (Time, Place, and Person)    Memory: Grossly intact   Judgment:  Fair  Insight:  Fair  Concentration:  Concentration: Fair and Attention Span: Fair  Recall:  not formally assessed   Fund of Knowledge: Fair  Language: Fair  Psychomotor Activity:  Increased and fidgety  Akathisia:  No  AIMS (if indicated): not done  Assets:  Manufacturing systems engineer Desire for Improvement Financial Resources/Insurance Housing Leisure Time Resilience Social Support Transportation  ADL's:  Impaired  Cognition: WNL  Sleep:  Poor but improving   PE: General: sits comfortably in view of camera; actively crying throughout appointment Pulm: no increased work of breathing on room air  MSK: all extremity movements appear intact  Neuro: no focal neurological deficits observed  Gait & Station: unable to assess by video    Metabolic Disorder Labs: Lab Results  Component Value Date   HGBA1C 5.3 07/01/2022   No results found for: "PROLACTIN" No results found for: "CHOL", "TRIG", "HDL", "CHOLHDL", "VLDL", "LDLCALC" No results found for: "TSH"  Therapeutic Level Labs: No results found for: "LITHIUM" No results found for: "CBMZ" No results found for:  "VALPROATE"  Screenings:  GAD-7    Flowsheet Row Initial Prenatal from 07/01/2022 in [facility] for [facility] at [facility] Office Visit from 02/20/2022 in [facility] for [facility] Healthcare at [facility] Office Visit from 07/03/2020 in [facility] at [facility]  Total GAD-7 Score 0 6 4      PHQ2-9  Flowsheet Row Office Visit from 11/18/2023 in [facility] [place] Initial Prenatal from 07/01/2022 in [facility] for [facility] Healthcare at [facility] Office Visit from 02/20/2022 in [facility] for [facility] Healthcare at [facility] Office Visit from 07/03/2020 in [facility] for [facility] Healthcare at [facility] Initial Prenatal from 07/10/2016 in [facility]  PHQ-2 Total Score 0 0 0 0 0  PHQ-9 Total Score -- 1 6 3 1       Flowsheet Row Office Visit from 11/18/2023 in [facility] [place] ED from 07/02/2023 in [facility] at [facility] Admission (Discharged) from 12/21/2022 in [place] 5S Mother Baby Unit  C-SSRS RISK CATEGORY No Risk No Risk No Risk       Collaboration of Care: Collaboration of Care: Medication Management AEB as above, Primary Care Provider AEB as above, and Referral or follow-up with counselor/therapist AEB as above  Patient/Guardian was advised Release of Information must be obtained prior to any record release in order to collaborate their care with an outside provider. Patient/Guardian was advised if they have not already done so to contact the registration department to sign all necessary forms in order for [place] to release information regarding their care.   Consent: Patient/Guardian gives verbal consent for treatment and assignment of benefits for services provided during this visit. Patient/Guardian expressed understanding and agreed to proceed.   Televisit via video: I connected with Abena Lormand Oesterle on 12/18/23 at   1:00 PM EST by a video enabled telemedicine application and verified that I am speaking with the correct person using two identifiers.  Location: Patient: home in [place] Provider: home office   I discussed the limitations of evaluation and management by telemedicine and the availability of in person appointments. The patient expressed understanding and agreed to proceed.  I discussed the assessment and treatment plan with the patient. The patient was provided an opportunity to ask questions and all were answered. The patient agreed with the plan and demonstrated an understanding of the instructions.   The patient was advised to call back or seek an in-person evaluation if the symptoms worsen or if the condition fails to improve as anticipated.  I provided 20 minutes dedicated to the care of this patient via video on the date of this encounter to include chart review, face-to-face time with the patient, medication management/counseling, coordination of care with primary care provider.  Elsie Lincoln, MD 1/23/20252:08 PM

## 2024-01-12 ENCOUNTER — Telehealth (INDEPENDENT_AMBULATORY_CARE_PROVIDER_SITE_OTHER): Payer: Medicaid Other | Admitting: Psychiatry

## 2024-01-12 ENCOUNTER — Encounter (HOSPITAL_COMMUNITY): Payer: Self-pay | Admitting: Psychiatry

## 2024-01-12 DIAGNOSIS — F4310 Post-traumatic stress disorder, unspecified: Secondary | ICD-10-CM

## 2024-01-12 DIAGNOSIS — F1211 Cannabis abuse, in remission: Secondary | ICD-10-CM

## 2024-01-12 DIAGNOSIS — F411 Generalized anxiety disorder: Secondary | ICD-10-CM | POA: Diagnosis not present

## 2024-01-12 DIAGNOSIS — F410 Panic disorder [episodic paroxysmal anxiety] without agoraphobia: Secondary | ICD-10-CM | POA: Diagnosis not present

## 2024-01-12 DIAGNOSIS — E559 Vitamin D deficiency, unspecified: Secondary | ICD-10-CM

## 2024-01-12 DIAGNOSIS — F4000 Agoraphobia, unspecified: Secondary | ICD-10-CM | POA: Diagnosis not present

## 2024-01-12 DIAGNOSIS — R0683 Snoring: Secondary | ICD-10-CM

## 2024-01-12 DIAGNOSIS — G2581 Restless legs syndrome: Secondary | ICD-10-CM

## 2024-01-12 DIAGNOSIS — F5104 Psychophysiologic insomnia: Secondary | ICD-10-CM | POA: Diagnosis not present

## 2024-01-12 MED ORDER — FLUOXETINE HCL 10 MG PO CAPS: 10.0000 mg | ORAL_CAPSULE | Freq: Every day | ORAL | 2 refills | Status: DC

## 2024-01-12 NOTE — Progress Notes (Signed)
 [facility] MD Outpatient Follow Up Note  Patient Identification: Jade Taylor MRN:  [id number] Date of Evaluation:  01/12/2024 Referral Source: PCP  Assessment:  Sianne Tejada Conlee is an established patient presenting for follow-up video conferencing appointment.  Today, 01/12/24, patient reports still rapid initiation of crying initially has improved but then spent most of the visit in tears.  Still typically whenever she is speaking to another person.  She tried one dose of 20 mg of Prozac and got nauseous on it and then went back to the 10 mg dose and also had several episodes of medication noncompliance over the last month due to ongoing reticence to use medication for her mental health. However she is noting improvement to anxiety and depression specifically with racing thoughts before bed and is able to get slightly better sleep though still at 6 to 7 hours per night.  Unfortunately still limited by ongoing arm pain and she is trying to hold off on surgery until her daughter is able to walk on her own.  Appetite is still relatively low with combination of Humira and Wegovy.  Vitamin D and iron were still somewhat low so she is continuing those supplements for now.  Ultimately discontinued Xanax as it was causing more problems than benefit but she has continued to fill this prescription and will take it from time to time.  Has been abstinent from cannabis as well.  She is still struggling to find a therapist that is in [place] network.  Follow-up in 2 months due to no planned medication changes.  For safety, her acute risk factors for suicide are: Current diagnosis of agoraphobia in the morgue in the home, medication noncompliance, prescription benzodiazepine use.  Her chronic risk factors for suicide are: Single parent, history of cannabis use disorder, chronic mental illness, chronic pain, unemployed.  Her protective factors are: No access to firearms, actively seeking engaging with mental health  care, minor children living in the home, supportive family, no suicidal ideation in session today.  While future events cannot be fully predicted she does not currently meet IVC criteria and can be continued as an outpatient.  Identifying information: Jade Taylor is a 33 y.o. female with a history of Generalized anxiety disorder with panic attacks, agoraphobia, Psychophysiologic insomnia with snoring, restless legs, and nighttime caffeine use, iron deficiency, cannabis use disorder, chronic muscle and joint pain, obesity on Wegovy who presents to [facility] via video conferencing for initial evaluation of anxiety and anger on 11/18/2023; please see that note for full case formulation.    Plan:  # Generalized anxiety disorder with panic attacks  agoraphobia Past medication trials: See medication trials below Status of problem: Chronic and stable Interventions: -- Continue Prozac 10 mg once daily (s12/24/24, i1/23/25, d1/24/25) -- Patient to contact insurer for therapist that is in network -- Patient cut back on caffeine use -- Coordinate with PCP for TSH, total T3, free T4  # Psychophysiologic insomnia with snoring and restless legs Past medication trials:  Status of problem: Chronic and stable Interventions: -- Patient to cut back on caffeine use --Continue to encourage abstinence from marijuana --Patient coordinate with PCP for possible sleep study and iron panel  # Cannabis use disorder in early remission Past medication trials:  Status of problem: not currently active Interventions: -- Continue to encourage abstinence  # Chronic muscle and joint pain and carrier of spinal muscular atrophy Past medication trials:  Status of problem: chronic and stable Interventions: -- Continue to  monitor and consider Depakote --On Humira  # Obesity on Wegovy  history of iron deficiency and vitamin D deficiency Past medication trials:  Status of problem:  chronic and stable Interventions: -- Continue vitamin D and iron supplement per PCP  Patient was given contact information for behavioral health clinic and was instructed to call 911 for emergencies.   Subjective:  Chief Complaint:  No chief complaint on file.   History of Present Illness:  Goes by [place]. Things have been about the same, never increased to 20mg  of prozac after taking 2 of 10mg  and getting nausea. Only took for a single dose. Also stopped for a few days and realized they were helping because she would get frustrated/irritable easily. The crying isn't happening as rapidly. Prefers to stay on the 10mg  dose due to being medication adverse (does start crying). Not able to identify a cause for current tears. Panic attacks still happen daily most frequently at night. Still hasn't been able to connect with a therapist through medicaid. Still doing caffeine but in the morning with either sweet tea or soda. Sleep slightly better but still around 6-7hrs per night. For her arm has been referred to [facility] in [place] to get at the root cause. Appetite still decreased with the wegovy but trying to do 2-3 small meals.  Alcohol is monthly and may have a margarita or one drink socially.    Past Psychiatric History:  Diagnoses: Generalized anxiety disorder with panic attacks, agoraphobia, Psychophysiologic insomnia with snoring, restless legs, and nighttime caffeine use, iron deficiency, cannabis use disorder, chronic muscle and joint pain Medication trials: cymbalta (stopped after 2 weeks due to carcinogen risk), hydroxyzine, xanax, gabapentin (ineffective), phentermine (ineffective), Prozac (effective) Previous psychiatrist/therapist: none Hospitalizations: none Suicide attempts: none SIB: none Hx of violence towards others: none Current access to guns: none Hx of trauma/abuse: none  Previous Psychotropic Medications: Yes   Substance Abuse History in the last 12 months:  Yes.     Past Medical History:  Past Medical History:  Diagnosis Date   Asthma    Chronic pain syndrome    History of narcotic use    Hydrocodone: 120/month for years; Dr. Hilda Lias   PTSD (post-traumatic stress disorder)     Past Surgical History:  Procedure Laterality Date   CARPAL TUNNEL RELEASE     CESAREAN SECTION N/A 01/23/2017   Procedure: CESAREAN SECTION;  Surgeon: Willodean Rosenthal, MD;  Location: [facility] BIRTHING SU[facility];  Service: Obstetrics;  Laterality: N/A;   CESAREAN SECTION WITH BILATERAL TUBAL LIGATION N/A 12/21/2022   Procedure: REPEAT CESAREAN SECTION WITH BILATERAL TUBAL LIGATION;  Surgeon: Lazaro Arms, MD;  Location: [facility] LD ORS;  Service: Obstetrics;  Laterality: N/A;   CHOLECYSTECTOMY N/A 08/17/2018   Procedure: LAPAROSCOPIC CHOLECYSTECTOMY;  Surgeon: Franky Macho, MD;  Location: [facility] ORS;  Service: General;  Laterality: N/A;   WISDOM TOOTH EXTRACTION      Family Psychiatric History: mother bipolar or schizophrenic, not sure of diagnosis  Family History:  Family History  Problem Relation Age of Onset   Bipolar disorder Mother    Schizophrenia Mother    Diabetes Mother    Hypertension Mother    Kidney disease Mother    Stroke Father    Heart disease Maternal Grandmother    Heart disease Paternal Grandfather     Social History:   Academic/Vocational: unemployed  Social History   Socioeconomic History   Marital status: Single    Spouse name: Not on file   Number of children:  Not on file   Years of education: Not on file   Highest education level: Not on file  Occupational History   Not on file  Tobacco Use   Smoking status: Former    Current packs/day: 0.50    Average packs/day: 0.5 packs/day for 0.5 years (0.3 ttl pk-yrs)    Types: Cigarettes   Smokeless tobacco: Never  Vaping Use   Vaping status: Former  Substance and Sexual Activity   Alcohol use: Yes    Comment: Socially will have 1 drink when consuming   Drug use: Not Currently    Types:  Marijuana    Comment: Sporadic use of blunt   Sexual activity: Yes    Birth control/protection: None  Other Topics Concern   Not on file  Social History Narrative   Are you right handed or left handed? Right   Are you currently employed ?    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 no   Who lives with you? children   What type of home do you live in: 1 story or 2 story? two    Caffeine 2 cups daily   Social Drivers of Health   Financial Resource Strain: Low Risk  (07/01/2022)   Overall Financial Resource Strain (CARDIA)    Difficulty of Paying Living Expenses: Not very hard  Food Insecurity: No Food Insecurity (12/21/2022)   Hunger Vital Sign    Worried About Running Out of Food in the Last Year: Never true    Ran Out of Food in the Last Year: Never true  Transportation Needs: No Transportation Needs (12/21/2022)   PRAPARE - Administrator, Civil Service (Medical): No    Lack of Transportation (Non-Medical): No  Physical Activity: Sufficiently Active (07/01/2022)   Exercise Vital Sign    Days of Exercise per Week: 5 days    Minutes of Exercise per Session: 30 min  Stress: No Stress Concern Present (07/01/2022)   [employer] of Occupational Health - Occupational Stress Questionnaire    Feeling of Stress : Not at all  Social Connections: Moderately Isolated (07/01/2022)   Social Connection and Isolation Panel [NHANES]    Frequency of Communication with Friends and Family: More than three times a week    Frequency of Social Gatherings with Friends and Family: Three times a week    Attends Religious Services: 1 to 4 times per year    Active Member of Clubs or Organizations: No    Attends Banker Meetings: Never    Marital Status: Never married    Additional Social History: updated  Allergies:   Allergies  Allergen Reactions   Metronidazole Hives, Shortness Of Breath, Rash and Other (See Comments)    Rash, Burning sensation-  was hospitalized   Penicillins Shortness Of Breath, Itching and Rash    Has patient had a PCN reaction causing immediate rash, facial/tongue/throat swelling, SOB or lightheadedness with hypotension: No Has patient had a PCN reaction causing severe rash involving mucus membranes or skin necrosis: No Has patient had a PCN reaction that required hospitalization Yes Has patient had a PCN reaction occurring within the last 10 years: No If all of the above answers are "NO", then may proceed with Cephalosporin use.    Amoxicillin Hives    Has patient had a PCN reaction causing immediate rash, facial/tongue/throat swelling, SOB or lightheadedness with hypotension: Yes Has patient had a PCN reaction causing severe rash involving mucus membranes  or skin necrosis: No Has patient had a PCN reaction that required hospitalization No Has patient had a PCN reaction occurring within the last 10 years: No If all of the above answers are "NO", then may proceed with Cephalosporin use.    Tape Rash    Surgical tape, bandaids    Current Medications: Current Outpatient Medications  Medication Sig Dispense Refill   ALPRAZolam (XANAX) 0.25 MG tablet Take 0.25 mg by mouth 2 (two) times daily as needed.     FEROSUL 325 (65 Fe) MG tablet Take 325 mg by mouth daily.     FLUoxetine (PROZAC) 10 MG capsule Take 1 capsule (10 mg total) by mouth daily. 30 capsule 2   HUMIRA-CD/UC/HS STARTER 80 MG/0.8ML pen Inject into the skin. 80 mg/0.61ml AJKT     HYDROcodone-acetaminophen (NORCO/VICODIN) 5-325 MG tablet Take 1 tablet by mouth every 4 (four) hours.     Vitamin D, Ergocalciferol, (DRISDOL) 1.25 MG (50000 UNIT) CAPS capsule Take 50,000 Units by mouth once a week.     WEGOVY 0.25 MG/0.5ML SOAJ Inject 0.25 mg into the skin once a week.     No current facility-administered medications for this visit.    ROS: Review of Systems  Constitutional:  Positive for appetite change. Negative for unexpected weight change.   Gastrointestinal:  Negative for constipation, diarrhea, nausea and vomiting.  Endocrine: Positive for cold intolerance and heat intolerance. Negative for polyphagia.  Musculoskeletal:  Positive for arthralgias, joint swelling and myalgias.  Skin:        Hair loss  Neurological:  Positive for dizziness and headaches.  Psychiatric/Behavioral:  Positive for decreased concentration and sleep disturbance. Negative for dysphoric mood, hallucinations, self-injury and suicidal ideas. The patient is nervous/anxious.     Objective:  Psychiatric Specialty Exam: not currently breastfeeding.There is no height or weight on file to calculate BMI.  General Appearance: Casual, Fairly Groomed, and appears stated age.  Tattoos present  Eye Contact:  Good  Speech:  Clear and Coherent and Normal Rate  Volume:  Normal  Mood:   "About the same"  Affect:  Appropriate, Congruent, Tearful, and anxious  Thought Content: Logical and Hallucinations: None   Suicidal Thoughts:  No  Homicidal Thoughts:  No  Thought Process:  Coherent, Goal Directed, and Linear  Orientation:  Full (Time, Place, and Person)    Memory: Grossly intact   Judgment:  Other:  Limited  Insight:  Fair  Concentration:  Concentration: Fair and Attention Span: Fair  Recall:  not formally assessed   Fund of Knowledge: Fair  Language: Fair  Psychomotor Activity:  Increased and fidgety  Akathisia:  No  AIMS (if indicated): not done  Assets:  Manufacturing systems engineer Desire for Improvement Financial Resources/Insurance Housing Leisure Time Resilience Social Support Transportation  ADL's:  Impaired  Cognition: WNL  Sleep:  Poor but stable   PE: General: sits comfortably in view of camera; actively crying throughout appointment Pulm: no increased work of breathing on room air  MSK: all extremity movements appear intact  Neuro: no focal neurological deficits observed  Gait & Station: unable to assess by video    Metabolic Disorder  Labs: Lab Results  Component Value Date   HGBA1C 5.3 07/01/2022   No results found for: "PROLACTIN" No results found for: "CHOL", "TRIG", "HDL", "CHOLHDL", "VLDL", "LDLCALC" No results found for: "TSH"  Therapeutic Level Labs: No results found for: "LITHIUM" No results found for: "CBMZ" No results found for: "VALPROATE"  Screenings:  GAD-7    Flowsheet  Row Initial Prenatal from 07/01/2022 in [facility] for [facility] at [facility] Office Visit from 02/20/2022 in [facility] for [facility] at [facility] Office Visit from 07/03/2020 in [facility] for [facility] at [facility]  Total GAD-7 Score 0 6 4      PHQ2-9    Flowsheet Row Office Visit from 11/18/2023 in [facility] [place] Initial Prenatal from 07/01/2022 in [facility] for [facility] at [facility] Office Visit from 02/20/2022 in [facility] for [facility] at [facility] Office Visit from 07/03/2020 in [facility] for [facility] at [facility] Initial Prenatal from 07/10/2016 in [facility]  PHQ-2 Total Score 0 0 0 0 0  PHQ-9 Total Score -- 1 6 3 1       Flowsheet Row Office Visit from 11/18/2023 in [facility] [place] ED from 07/02/2023 in [facility] at [facility] Admission (Discharged) from 12/21/2022 in [facility] Baby [facility]  C-SSRS RISK CATEGORY No Risk No Risk No Risk       Collaboration of Care: Collaboration of Care: Medication Management AEB as above, Primary Care Provider AEB as above, and Referral or follow-up with counselor/therapist AEB as above  Patient/Guardian was advised Release of Information must be obtained prior to any record release in order to collaborate their care with an outside provider. Patient/Guardian was advised if they have not already done so to contact the registration department to sign all necessary forms in order for  [place] to release information regarding their care.   Consent: Patient/Guardian gives verbal consent for treatment and assignment of benefits for services provided during this visit. Patient/Guardian expressed understanding and agreed to proceed.   Televisit via video: I connected with Letita Prentiss Morden on 01/12/24 at 11:00 AM EST by a video enabled telemedicine application and verified that I am speaking with the correct person using two identifiers.  Location: Patient: home in [place] Provider: home office   I discussed the limitations of evaluation and management by telemedicine and the availability of in person appointments. The patient expressed understanding and agreed to proceed.  I discussed the assessment and treatment plan with the patient. The patient was provided an opportunity to ask questions and all were answered. The patient agreed with the plan and demonstrated an understanding of the instructions.   The patient was advised to call back or seek an in-person evaluation if the symptoms worsen or if the condition fails to improve as anticipated.  I provided 20 minutes dedicated to the care of this patient via video on the date of this encounter to include chart review, face-to-face time with the patient, medication management/counseling, coordination of care with primary care provider.  Elsie Lincoln, MD 2/17/202511:16 AM

## 2024-01-12 NOTE — Patient Instructions (Signed)
 We did not make any medication changes today.  Keep trying to find a therapist that is in [place] network.

## 2024-01-15 ENCOUNTER — Other Ambulatory Visit: Payer: Self-pay | Admitting: Family Medicine

## 2024-01-15 DIAGNOSIS — N6452 Nipple discharge: Secondary | ICD-10-CM

## 2024-02-22 ENCOUNTER — Ambulatory Visit
Admission: RE | Admit: 2024-02-22 | Discharge: 2024-02-22 | Disposition: A | Discharge status: Home / Self Care | Payer: Medicaid Other | Source: Ambulatory Visit | Attending: Family Medicine | Admitting: Family Medicine

## 2024-02-22 DIAGNOSIS — N6452 Nipple discharge: Secondary | ICD-10-CM

## 2024-02-22 MED ORDER — GADOPICLENOL 0.5 MMOL/ML IV SOLN
10.0000 mL | Freq: Once | INTRAVENOUS | Status: AC | PRN
  Administered 2024-02-22: 10 mL via INTRAVENOUS

## 2024-03-16 ENCOUNTER — Telehealth (INDEPENDENT_AMBULATORY_CARE_PROVIDER_SITE_OTHER): Payer: Medicaid Other | Admitting: Psychiatry

## 2024-03-16 ENCOUNTER — Encounter (HOSPITAL_COMMUNITY): Payer: Self-pay | Admitting: Psychiatry

## 2024-03-16 DIAGNOSIS — F4001 Agoraphobia with panic disorder: Secondary | ICD-10-CM | POA: Diagnosis not present

## 2024-03-16 DIAGNOSIS — F411 Generalized anxiety disorder: Secondary | ICD-10-CM | POA: Diagnosis not present

## 2024-03-16 DIAGNOSIS — E559 Vitamin D deficiency, unspecified: Secondary | ICD-10-CM

## 2024-03-16 DIAGNOSIS — F5104 Psychophysiologic insomnia: Secondary | ICD-10-CM | POA: Diagnosis not present

## 2024-03-16 DIAGNOSIS — F4310 Post-traumatic stress disorder, unspecified: Secondary | ICD-10-CM | POA: Diagnosis not present

## 2024-03-16 DIAGNOSIS — F4000 Agoraphobia, unspecified: Secondary | ICD-10-CM

## 2024-03-16 MED ORDER — FLUOXETINE HCL 40 MG PO CAPS: 40.0000 mg | ORAL_CAPSULE | Freq: Every day | ORAL | 4 refills | Status: AC

## 2024-03-16 NOTE — Progress Notes (Signed)
 BH MD Outpatient Follow Up Note  Patient Identification: Jade Taylor MRN:  [id number] Date of Evaluation:  03/16/2024 Referral Source: PCP  Assessment:  Jade Taylor is an established patient presenting for follow-up video conferencing appointment.  Today, 03/16/24, patient reports still no improvement with symptoms of rapid initiation of crying and did ultimately decide to take the increased dose of fluoxetine .  For more objective measure this was the longest is ever taken for her to cry during session and she was able to reconstitute more quickly than previously.  While insight remains poor into precipitants for anxiety and crying she was readily able to describe interactions with other people as main stressors for this.  Sleep though still at 6 to 7 hours per night and is unfortunately still limited by ongoing arm pain and she is trying to hold off on surgery until her daughter is able to walk on her own.  Appetite is still relatively low with combination of Humira and Wegovy.  Vitamin D and iron were still somewhat low so she is continuing those supplements for now.  Ultimately discontinued Xanax as it was causing more problems than benefit but she has continued to fill this prescription and will take it from time to time.  She has also been prescribed gabapentin  for the arm pain and is on concurrent Norco for this.  Has been abstinent from cannabis as well.  She is still struggling to find a therapist that is in [place] network and placed psychotherapy referral today.  No follow-up planned and provider transition discussed.  For safety, her acute risk factors for suicide are: Current diagnosis of agoraphobia, prescription benzodiazepine use.  Her chronic risk factors for suicide are: Single parent, history of cannabis use disorder, chronic mental illness, chronic pain, unemployed.  Her protective factors are: No access to firearms, actively seeking engaging with mental health care,  minor children living in the home, supportive family, no suicidal ideation in session today.  While future events cannot be fully predicted she does not currently meet IVC criteria and can be continued as an outpatient.  Identifying information: Jade Taylor is a 33 y.o. female with a history of Generalized anxiety disorder with panic attacks, agoraphobia, Psychophysiologic insomnia with snoring, restless legs, and nighttime caffeine use, iron deficiency, cannabis use disorder, chronic muscle and joint pain, obesity on Wegovy who presents to [facility] via video conferencing for initial evaluation of anxiety and anger on 11/18/2023; please see that note for full case formulation.  She tried one dose of 20 mg of Prozac  and got nauseous on it and then went back to the 10 mg dose and also had several episodes of medication noncompliance due to ongoing reticence to use medication for her mental health.  Plan:  # Generalized anxiety disorder with panic attacks  agoraphobia Past medication trials: See medication trials below Status of problem: Improving Interventions: -- Titrate Prozac  to 40 mg once daily (s12/24/24, i1/23/25, d1/24/25, i3/3/25, i4/22/25) -- Psychotherapy referral placed -- Patient cut back on caffeine use -- Coordinate with PCP for TSH, total T3, free T4  # Psychophysiologic insomnia with snoring and restless legs Past medication trials:  Status of problem: Chronic and stable Interventions: -- Patient to cut back on caffeine use --Continue to encourage abstinence from marijuana --Patient coordinate with PCP for possible sleep study and iron panel  # Cannabis use disorder in early remission Past medication trials:  Status of problem: not currently active Interventions: -- Continue to encourage  abstinence  # Chronic muscle and joint pain and carrier of spinal muscular atrophy Past medication trials:  Status of problem: chronic and  stable Interventions: -- Continue to monitor and consider Depakote --On Humira and norco and gabapentin   # Obesity on Wegovy  history of iron deficiency and vitamin D deficiency Past medication trials:  Status of problem: chronic and stable Interventions: -- Continue vitamin D and iron supplement per PCP  Patient was given contact information for behavioral health clinic and was instructed to call 911 for emergencies.   Subjective:  Chief Complaint:  Chief Complaint  Patient presents with   Anxiety   Follow-up   Stress   Panic Attack    History of Present Illness:  Goes by Jade Taylor. Says good but more holding steady. Has continued taking prozac  since last appointment but cannot tell that it is improving anything. Not noting any nausea or headaches. Has been taking the 20mg  since 01/26/24 it would be amenable to titration. Says that her attitude is main thing that needs intervention as will be in a good mood and then getting pissed off in a matter of minutes. Can't identify any precursors but with further reflection is when she gets frustrated when wanting to be left alone and isn't. Had verbal confrontation with hand surgeon. (Begins crying). Doesn't think this has changed but pointed out this was the longest she has gone before crying in session. Has stopped xanax at this point but panic attacks still happen daily and similar to the above can't identify precursors but also notes they happen while driving, at home, out and about. Wonders if it is from trouble breathing. Still makes sweet tea in the morning and infrequent soda when eating out. Sleep slightly better but still around 6-7hrs per night due to being awoken by hands going numb and getting uncomfortable. Will see neurology and ortho and rheum at [facility]. Appetite still decreased with the wegovy but trying to do 2-3 small meals. Still hasn't been able to connect with a therapist through medicaid.   Alcohol is monthly and may have a  margarita or one drink socially but none recently.    Past Psychiatric History:  Diagnoses: Generalized anxiety disorder with panic attacks, agoraphobia, Psychophysiologic insomnia with snoring, restless legs, and nighttime caffeine use, iron deficiency, cannabis use disorder, chronic muscle and joint pain Medication trials: cymbalta  (stopped after 2 weeks due to carcinogen risk), hydroxyzine , xanax, gabapentin  (ineffective), phentermine (ineffective), Prozac  (effective) Previous psychiatrist/therapist: none Hospitalizations: none Suicide attempts: none SIB: none Hx of violence towards others: none Current access to guns: none Hx of trauma/abuse: none  Previous Psychotropic Medications: Yes   Substance Abuse History in the last 12 months:  Yes.    Past Medical History:  Past Medical History:  Diagnosis Date   Asthma    Chronic pain syndrome    History of narcotic use    Hydrocodone : 120/month for years; Dr. Iline Mallory   PTSD (post-traumatic stress disorder)     Past Surgical History:  Procedure Laterality Date   CARPAL TUNNEL RELEASE     CESAREAN SECTION N/A 01/23/2017   Procedure: CESAREAN SECTION;  Surgeon: Lenord Radon, MD;  Location: [facility];  Service: Obstetrics;  Laterality: N/A;   CESAREAN SECTION WITH BILATERAL TUBAL LIGATION N/A 12/21/2022   Procedure: REPEAT CESAREAN SECTION WITH BILATERAL TUBAL LIGATION;  Surgeon: Wendelyn Halter, MD;  Location: [facility]S;  Service: Obstetrics;  Laterality: N/A;   CHOLECYSTECTOMY N/A 08/17/2018   Procedure: LAPAROSCOPIC CHOLECYSTECTOMY;  Surgeon:  Alanda Allegra, MD;  Location: [facility];  Service: General;  Laterality: N/A;   WISDOM TOOTH EXTRACTION      Family Psychiatric History: mother bipolar or schizophrenic, not sure of diagnosis  Family History:  Family History  Problem Relation Age of Onset   Bipolar disorder Mother    Schizophrenia Mother    Diabetes Mother    Hypertension Mother    Kidney disease Mother     Stroke Father    Heart disease Maternal Grandmother    Heart disease Paternal Grandfather     Social History:   Academic/Vocational: unemployed  Social History   Socioeconomic History   Marital status: Single    Spouse name: Not on file   Number of children: Not on file   Years of education: Not on file   Highest education level: Not on file  Occupational History   Not on file  Tobacco Use   Smoking status: Former    Current packs/day: 0.50    Average packs/day: 0.5 packs/day for 0.5 years (0.3 ttl pk-yrs)    Types: Cigarettes   Smokeless tobacco: Never  Vaping Use   Vaping status: Former  Substance and Sexual Activity   Alcohol use: Yes    Comment: Socially will have 1 drink when consuming   Drug use: Not Currently    Types: Marijuana    Comment: Sporadic use of blunt   Sexual activity: Yes    Birth control/protection: None  Other Topics Concern   Not on file  Social History Narrative   Are you right handed or left handed? Right   Are you currently employed ?    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 no   Who lives with you? children   What type of home do you live in: 1 story or 2 story? two    Caffeine 2 cups daily   Social Drivers of Health   Financial Resource Strain: Low Risk  (07/01/2022)   Overall Financial Resource Strain (CARDIA)    Difficulty of Paying Living Expenses: Not very hard  Food Insecurity: No Food Insecurity (12/21/2022)   Hunger Vital Sign    Worried About Running Out of Food in the Last Year: Never true    Ran Out of Food in the Last Year: Never true  Transportation Needs: No Transportation Needs (12/21/2022)   PRAPARE - Administrator, Civil Service (Medical): No    Lack of Transportation (Non-Medical): No  Physical Activity: Sufficiently Active (07/01/2022)   Exercise Vital Sign    Days of Exercise per Week: 5 days    Minutes of Exercise per Session: 30 min  Stress: No Stress Concern Present  (07/01/2022)   [employer] of Occupational Health - Occupational Stress Questionnaire    Feeling of Stress : Not at all  Social Connections: Moderately Isolated (07/01/2022)   Social Connection and Isolation Panel [NHANES]    Frequency of Communication with Friends and Family: More than three times a week    Frequency of Social Gatherings with Friends and Family: Three times a week    Attends Religious Services: 1 to 4 times per year    Active Member of Clubs or Organizations: No    Attends Banker Meetings: Never    Marital Status: Never married    Additional Social History: updated  Allergies:   Allergies  Allergen Reactions   Metronidazole Hives, Shortness Of Breath, Rash and Other (See Comments)  Rash, Burning sensation- was hospitalized   Penicillins Shortness Of Breath, Itching and Rash    Has patient had a PCN reaction causing immediate rash, facial/tongue/throat swelling, SOB or lightheadedness with hypotension: No Has patient had a PCN reaction causing severe rash involving mucus membranes or skin necrosis: No Has patient had a PCN reaction that required hospitalization Yes Has patient had a PCN reaction occurring within the last 10 years: No If all of the above answers are "NO", then may proceed with Cephalosporin use.    Amoxicillin Hives    Has patient had a PCN reaction causing immediate rash, facial/tongue/throat swelling, SOB or lightheadedness with hypotension: Yes Has patient had a PCN reaction causing severe rash involving mucus membranes or skin necrosis: No Has patient had a PCN reaction that required hospitalization No Has patient had a PCN reaction occurring within the last 10 years: No If all of the above answers are "NO", then may proceed with Cephalosporin use.    Tape Rash    Surgical tape, bandaids    Current Medications: Current Outpatient Medications  Medication Sig Dispense Refill   gabapentin  (NEURONTIN ) 300 MG capsule Take 300  mg by mouth 3 (three) times daily.     naloxone  (NARCAN ) nasal spray 4 mg/0.1 mL Place 1 spray into the nose once. For opiate overdose.     ALPRAZolam (XANAX) 0.25 MG tablet Take 0.25 mg by mouth 2 (two) times daily as needed.     FEROSUL 325 (65 Fe) MG tablet Take 325 mg by mouth daily.     FLUoxetine  (PROZAC ) 40 MG capsule Take 1 capsule (40 mg total) by mouth daily. 30 capsule 4   HUMIRA-CD/UC/HS STARTER 80 MG/0.8ML pen Inject into the skin. 80 mg/0.74ml AJKT     HYDROcodone -acetaminophen  (NORCO/VICODIN) 5-325 MG tablet Take 1 tablet by mouth every 4 (four) hours.     Vitamin D, Ergocalciferol, (DRISDOL) 1.25 MG (50000 UNIT) CAPS capsule Take 50,000 Units by mouth once a week.     WEGOVY 0.25 MG/0.5ML SOAJ Inject 0.25 mg into the skin once a week.     No current facility-administered medications for this visit.    ROS: Review of Systems  Constitutional:  Positive for appetite change. Negative for unexpected weight change.  Gastrointestinal:  Negative for constipation, diarrhea, nausea and vomiting.  Endocrine: Positive for cold intolerance and heat intolerance. Negative for polyphagia.  Musculoskeletal:  Positive for arthralgias, joint swelling and myalgias.  Skin:        Hair loss  Neurological:  Positive for dizziness and headaches.  Psychiatric/Behavioral:  Positive for decreased concentration and sleep disturbance. Negative for dysphoric mood, hallucinations, self-injury and suicidal ideas. The patient is nervous/anxious.     Objective:  Psychiatric Specialty Exam: not currently breastfeeding.There is no height or weight on file to calculate BMI.  General Appearance: Casual, Fairly Groomed, and appears stated age.  Tattoos present  Eye Contact:  Good  Speech:  Clear and Coherent and Normal Rate  Volume:  Normal  Mood:   "Good, about the same"  Affect:  Appropriate, Congruent, Tearful, and anxious  Thought Content: Logical and Hallucinations: None   Suicidal Thoughts:  No   Homicidal Thoughts:  No  Thought Process:  Coherent, Goal Directed, and Linear  Orientation:  Full (Time, Place, and Person)    Memory: Grossly intact   Judgment:  Other:  Limited  Insight:  Fair  Concentration:  Concentration: Fair and Attention Span: Fair  Recall:  not formally assessed   Fund of  Knowledge: Fair  Language: Fair  Psychomotor Activity:  Increased and fidgety  Akathisia:  No  AIMS (if indicated): not done  Assets:  Communication Skills Desire for Improvement Financial Resources/Insurance Housing Leisure Time Resilience Social Support Transportation  ADL's:  Impaired  Cognition: WNL  Sleep:  Poor but stable   PE: General: sits comfortably in view of camera; actively crying throughout appointment Pulm: no increased work of breathing on room air  MSK: all extremity movements appear intact  Neuro: no focal neurological deficits observed  Gait & Station: unable to assess by video    Metabolic Disorder Labs: Lab Results  Component Value Date   HGBA1C 5.3 07/01/2022   No results found for: "PROLACTIN" No results found for: "CHOL", "TRIG", "HDL", "CHOLHDL", "VLDL", "LDLCALC" No results found for: "TSH"  Therapeutic Level Labs: No results found for: "LITHIUM" No results found for: "CBMZ" No results found for: "VALPROATE"  Screenings:  GAD-7    Flowsheet Row Initial Prenatal from 07/01/2022 in [facility] for [facility] at [facility] Office Visit from 02/20/2022 in [facility] for [facility] at [facility] Office Visit from 07/03/2020 in [facility] at [facility]  Total GAD-7 Score 0 6 4      PHQ2-9    Flowsheet Row Office Visit from 11/18/2023 in [facility] at [place] Initial Prenatal from 07/01/2022 in [facility] for [facility] Healthcare at [facility] Office Visit from 02/20/2022 in [facility] for [facility] Healthcare at [facility] Office Visit from  07/03/2020 in [facility] for [facility] Healthcare at [facility] Initial Prenatal from 07/10/2016 in [facility]  PHQ-2 Total Score 0 0 0 0 0  PHQ-9 Total Score -- 1 6 3 1       Flowsheet Row Office Visit from 11/18/2023 in [facility] at [place] ED from 07/02/2023 in [facility] at [facility] Admission (Discharged) from 12/21/2022 in [place] 5S Mother Baby Unit  C-SSRS RISK CATEGORY No Risk No Risk No Risk       Collaboration of Care: Collaboration of Care: Medication Management AEB as above, Primary Care Provider AEB as above, and Referral or follow-up with counselor/therapist AEB as above  Patient/Guardian was advised Release of Information must be obtained prior to any record release in order to collaborate their care with an outside provider. Patient/Guardian was advised if they have not already done so to contact the registration department to sign all necessary forms in order for us  to release information regarding their care.   Consent: Patient/Guardian gives verbal consent for treatment and assignment of benefits for services provided during this visit. Patient/Guardian expressed understanding and agreed to proceed.   Televisit via video: I connected with Jade Taylor on 03/16/24 at  4:30 PM EDT by a video enabled telemedicine application and verified that I am speaking with the correct person using two identifiers.  Location: Patient: home in [place] Provider: home office   I discussed the limitations of evaluation and management by telemedicine and the availability of in person appointments. The patient expressed understanding and agreed to proceed.  I discussed the assessment and treatment plan with the patient. The patient was provided an opportunity to ask questions and all were answered. The patient agreed with the plan and demonstrated an understanding of the instructions.   The patient was advised to call back or seek an  in-person evaluation if the symptoms worsen or if the  condition fails to improve as anticipated.  I provided 25 minutes dedicated to the care of this patient via video on the date of this encounter to include chart review, face-to-face time with the patient, medication management/counseling, coordination of care with primary care provider.  Madie Schilling, MD 4/22/20254:58 PM

## 2024-03-16 NOTE — Patient Instructions (Signed)
 We increased the fluoxetine  (Prozac ) to 40 mg once daily today.  I also placed a referral to our clinic for psychotherapy to be on the lookout for a phone call from my front desk to get you on their schedule.  Local options for a new psychiatrist would be [facility] in [place] or [facility] in [place].  If he wanted to say within the [facility] health system try calling the [place] office.

## 2024-07-16 ENCOUNTER — Encounter: Payer: Self-pay | Admitting: Radiology

## 2024-09-27 ENCOUNTER — Encounter: Payer: Self-pay | Admitting: Radiology

## 2024-11-11 ENCOUNTER — Ambulatory Visit (HOSPITAL_BASED_OUTPATIENT_CLINIC_OR_DEPARTMENT_OTHER): Admitting: Physical Therapy

## 2024-11-11 ENCOUNTER — Other Ambulatory Visit: Payer: Self-pay

## 2024-11-11 ENCOUNTER — Encounter (HOSPITAL_BASED_OUTPATIENT_CLINIC_OR_DEPARTMENT_OTHER): Payer: Self-pay | Admitting: Physical Therapy

## 2024-11-11 DIAGNOSIS — M79605 Pain in left leg: Secondary | ICD-10-CM | POA: Insufficient documentation

## 2024-11-11 DIAGNOSIS — M79672 Pain in left foot: Secondary | ICD-10-CM | POA: Diagnosis present

## 2024-11-11 DIAGNOSIS — M79601 Pain in right arm: Secondary | ICD-10-CM | POA: Insufficient documentation

## 2024-11-11 DIAGNOSIS — M79602 Pain in left arm: Secondary | ICD-10-CM | POA: Diagnosis present

## 2024-11-11 DIAGNOSIS — M79671 Pain in right foot: Secondary | ICD-10-CM | POA: Diagnosis present

## 2024-11-11 DIAGNOSIS — M79604 Pain in right leg: Secondary | ICD-10-CM | POA: Diagnosis present

## 2024-11-11 DIAGNOSIS — R2681 Unsteadiness on feet: Secondary | ICD-10-CM | POA: Diagnosis present

## 2024-11-11 NOTE — Therapy (Signed)
 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 Brenna   PTSD (post-traumatic stress disorder)    Past Surgical History:  Procedure Laterality Date   CARPAL TUNNEL RELEASE     CESAREAN SECTION N/A 01/23/2017   Procedure: CESAREAN SECTION;  Surgeon: Elveria Mungo, MD;  Location: [facility] BIRTHING SUITES;  Service: Obstetrics;  Laterality: N/A;   CESAREAN SECTION WITH BILATERAL TUBAL LIGATION N/A 12/21/2022   Procedure: REPEAT CESAREAN SECTION WITH BILATERAL TUBAL LIGATION;  Surgeon: Jayne Vonn DEL, MD;  Location: [facility] LD [facility]S;  Service: Obstetrics;  Laterality: N/A;   CHOLECYSTECTOMY N/A 08/17/2018   Procedure: LAPAROSCOPIC CHOLECYSTECTOMY;  Surgeon: Mavis Anes, MD;  Location: [facility] OR[facility];  Service: General;  Laterality: N/A;   WISDOM TOOTH EXTRACTION     Patient Active Problem List   Diagnosis Date Noted   Generalized anxiety disorder with panic attacks 11/18/2023   Agoraphobia 11/18/2023   Psychophysiologic insomnia with snoring, restless legs 11/18/2023   Iron deficiency anemia 11/18/2023   Vitamin D deficiency 11/18/2023   Postpartum care following cesarean delivery 01/31/2023   S/P cesarean section 12/21/2022   Status post tubal ligation 12/21/2022   Status post repeat low transverse cesarean section 12/21/2022   Carrier of spinal muscular atrophy 07/22/2022   History of  cesarean delivery 07/01/2022   Calculus of gallbladder with acute cholecystitis without obstruction    Allergy to adhesive tape 02/28/2017   History of IUFD 08/07/2016   Chronic pain syndrome    PTSD (post-traumatic stress disorder)    Asthma    Bilateral shoulder pain 01/25/2016   CARPAL TUNNEL SYNDROME 02/01/2009    PCP: W[place]ell [place]ines FNP  REFERRING PROVIDER:   Maree Rumble, MD    REFERRING DIAG: R26.81 (ICD-10-CM) - Unsteady gait   Rationale for Evaluation and Treatment: Rehabilitation  THERAPY DIAG:  Bilateral leg and foot pain  Bilateral arm pain  Unsteadiness on feet  ONSET DATE: chronic  SUBJECTIVE:  SUBJECTIVE STATEMENT: I can be standing and my legs will give everyday  ut have not fallen.  The pain is everywhere, in my hips and knees, can be shoulders, hands and feet.  L side worse than rightThey have done nerve study [facility] and they told me I have nerve damage.  Had R carpel tunnel surgery in aug, middle finger still numb. My rheumatologist sent me here. I hurt during day doing house chores and putting up my hair.  PERTINENT HISTORY:  fibromyalgia  PAIN:  Are you having pain? Yes: NPRS scale: current 5-6/10; worst 8+/10; least 2-3/10 Pain location: elbows, shoulder, hips and knees; wrists and hands L>R Pain description: strong ache, shooting into feet Aggravating factors: unknown Relieving factors: hot shower momentarily  PRECAUTIONS: None   WEIGHT BEARING RESTRICTIONS: No  FALLS:  Has patient fallen in last 6 months? No   OCCUPATION: applied for disability  PLOF: Independent  PATIENT GOALS: strength in arms reduce pain, improve balance  NEXT MD VISIT: 12/02/24 Pain management  OBJECTIVE:  Note: Objective measures were completed at Evaluation unless otherwise  noted.  DIAGNOSTIC FINDINGS:  N/a  PATIENT SURVEYS:  LEFS 38/80    POSTURE: slight valgus bilateral knee   LUMBAR ROM:   Full  LOWER EXTREMITY ROM:     WFL  LOWER EXTREMITY MMT:    MMT Right eval Left eval  Hip flexion 46.0 38.3  Hip extension    Hip abduction 34.2 35.1  Hip adduction    Hip internal rotation    Hip external rotation    Knee flexion    Knee extension 35.4 38.4  Ankle dorsiflexion    Ankle plantarflexion    Ankle inversion    Ankle eversion     (Blank rows = not tested)  FUNCTIONAL TESTS:  5 times sit to stand: 10.60 Timed up and go (TUG): 8.67 4 stage balance test: Passed 1&2. Tandem unsteady x 10s; SLS unsteady x 7 s  GAIT: Distance walked: 500 ft Assistive device utilized: None Level of assistance: Complete Independence Comments: wfl.  Slight antalgic gait pattern with initiation of gait  TREATMENT  Eval Self care:Posture and optometrist instruction                                                                                                                              PATIENT EDUCATION:  Education details: Discussed eval findings, rehab rationale, aquatic program progression/POC and pools in area. Patient is in agreement  Person educated: Patient Education method: Explanation Education comprehension: verbalized understanding  HOME EXERCISE PROGRAM: Aquatic TBA  ASSESSMENT:  CLINICAL IMPRESSION: Patient is a 33 y.o. f who was seen today for physical therapy evaluation and treatment for unsteady gait. She has long history of fibromyalgia since she was a child.  She reports today of generalized pain throughout shoulders, arms wrists, hips, knees and feet.  States that her legs give out on her daily but she has not had  a fall. She is unable to identify aggravating or relieving factors of her pain. Her chronic pain limites her endurance, activity tolerance, gait, balance, and functional mobility with ADL's. She has difficulty  performing house chores and her adl's.  She will benefit from skilled aquatic physical therapy using the properties of water  to improve all deficits and reduce/manage pain . Pt is coming in from [place].  Discussed possible pool access at [facility] their if we find that the benefit of aquatic exercise warrants.     OBJECTIVE IMPAIRMENTS: decreased activity tolerance, decreased balance, decreased endurance, decreased mobility, decreased strength, impaired sensation, obesity, and pain.   ACTIVITY LIMITATIONS: carrying, lifting, bending, sitting, standing, squatting, stairs, transfers, and locomotion level  PARTICIPATION LIMITATIONS: meal prep, cleaning, laundry, shopping, community activity, occupation, and yard work  PERSONAL FACTORS: Time since onset of injury/illness/exacerbation are also affecting patient's functional outcome.   REHAB POTENTIAL: Good  CLINICAL DECISION MAKING: Evolving/moderate complexity  EVALUATION COMPLEXITY: Moderate   GOALS: Goals reviewed with patient? Yes  SHORT TERM GOALS: Target date: 12/12/24  Pt will tolerate full aquatic sessions consistently without increase in pain and with improving function to demonstrate good toleration and effectiveness of intervention.  Baseline: Goal status: INITIAL  2.  Pt will consider gaining pool access for use of the properties of water  for chronic conditions maintaining mobility and minimizing pain. Baseline:  Goal status: INITIAL  3.  Pt will report a 50% reduction in general pain while submerged to demonstrate the pain management benefit of aquatics Baseline:  Goal status: INITIAL    LONG TERM GOALS: Target date: 01/07/25  Pt to improve on LEFS by at least 9 point to demonstrate statistically significant Improvement in function. Baseline: 38/80 Goal status: INITIAL  2.  Pt will improve strength in left hip flex to within 5lb of contralateral side to demonstrate improved overall physical function Baseline: see  chart Goal status: INITIAL  3.  Pt will report ability to complete house chores without pain limitation by 50% Baseline:  Goal status: INITIAL  4.  Pt will report a reduction in LE giving to < daily Baseline:  Goal status: INITIAL  5.  Pt will perform SLS x 20s to demonstrate improvement in balance Baseline:  Goal status: INITIAL  6.  Pt will be indep with final aquatic HEP for continued management of condition Baseline:  Goal status: INITIAL  PLAN:  PT FREQUENCY: 1-2x/week  PT DURATION: 8 weeks  PLANNED INTERVENTIONS: 97110-Therapeutic exercises, 97530- Therapeutic activity, 97112- Neuromuscular re-education, 97535- Self Care, 02859- Manual therapy, [id number]- Gait training, [phone]- Aquatic Therapy, [phone] (1-2 muscles), [phone] (3+ muscles)- Dry Needling, Patient/Family education, Balance training, Stair training, Taping, Joint mobilization, DME instructions, Cryotherapy, and Moist heat.  PLAN FOR NEXT SESSION: aquatics only:pain reduction/management; general strengthening and ROM; balance retraining   Ronal Foots) Rajohn Henery MPT 11/11/2024 1:54 PM [facility] [address] [place], [place], [address] Phone: [phone]   Fax:  [phone]   For all possible CPT codes, reference the Planned Interventions line above.     Check all conditions that are expected to impact treatment: {Conditions expected to impact treatment:Morbid obesity, Musculoskeletal disorders, and Active major medical illness   If treatment provided at initial evaluation, no treatment charged due to lack of authorization.

## 2024-11-30 ENCOUNTER — Other Ambulatory Visit (HOSPITAL_COMMUNITY)
Admission: RE | Admit: 2024-11-30 | Discharge: 2024-11-30 | Disposition: A | Discharge status: Home / Self Care | Source: Ambulatory Visit | Attending: Obstetrics & Gynecology | Admitting: Obstetrics & Gynecology

## 2024-11-30 ENCOUNTER — Ambulatory Visit: Admitting: Obstetrics & Gynecology

## 2024-11-30 ENCOUNTER — Encounter: Payer: Self-pay | Admitting: Obstetrics & Gynecology

## 2024-11-30 VITALS — BP 122/85 | HR 83 | Ht 62.0 in | Wt 223.0 lb

## 2024-11-30 DIAGNOSIS — Z01419 Encounter for gynecological examination (general) (routine) without abnormal findings: Secondary | ICD-10-CM | POA: Insufficient documentation

## 2024-11-30 MED ORDER — SILVER SULFADIAZINE 1 % EX CREA: TOPICAL_CREAM | CUTANEOUS | 11 refills | Status: AC

## 2024-11-30 NOTE — Progress Notes (Signed)
 Subjective:     Jade Taylor is a 34 y.o. female here for a routine exam.  Patient's last menstrual period was 11/12/2024. [id number] Birth Control Method:  BTL Menstrual Calendar(currently): regular  Current complaints: periods are heavier.   Current acute medical issues:  HS   Recent Gynecologic History Patient's last menstrual period was 11/12/2024. 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 Brenna   PTSD (post-traumatic stress disorder)     Past Surgical History:  Procedure Laterality Date   CARPAL TUNNEL RELEASE     CESAREAN SECTION N/A 01/23/2017   Procedure: CESAREAN SECTION;  Surgeon: Elveria Mungo, MD;  Location: [facility] BIRTHING SUITES;  Service: Obstetrics;  Laterality: N/A;   CESAREAN SECTION WITH BILATERAL TUBAL LIGATION N/A 12/21/2022   Procedure: REPEAT CESAREAN SECTION WITH BILATERAL TUBAL LIGATION;  Surgeon: Jayne Vonn DEL, MD;  Location: [facility] LD ORS;  Service: Obstetrics;  Laterality: N/A;   CHOLECYSTECTOMY N/A 08/17/2018   Procedure: LAPAROSCOPIC CHOLECYSTECTOMY;  Surgeon: Mavis Anes, MD;  Location: [facility] OR[facility];  Service: General;  Laterality: N/A;   WISDOM TOOTH EXTRACTION      OB History     Gravida  6   Para  4   Term  2   Preterm  2   AB  2   Living  2      SAB  1   IAB  1   Ectopic      Multiple  0   Live Births  2           Social History   Socioeconomic History   Marital status: Single    Spouse name: Not on file   Number of children: Not on file   Years of education: Not on file   Highest education level: Not on file  Occupational History   Not on file  Tobacco Use   Smoking status: Former    Current packs/day: 0.50    Average packs/day: 0.5 packs/day for 0.5 years (0.3 ttl pk-yrs)    Types: Cigarettes   Smokeless tobacco: Never  Vaping Use   Vaping status: Former   Substance and Sexual Activity   Alcohol use: Yes    Comment: Socially will have 1 drink when consuming   Drug use: Not Currently    Types: Marijuana    Comment: Sporadic use of blunt   Sexual activity: Yes    Birth control/protection: None  Other Topics Concern   Not on file  Social History Narrative   Are you right handed or left handed? Right   Are you currently employed ?    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 no   Who lives with you? children   What type of home do you live in: 1 story or 2 story? two    Caffeine 2 cups daily   Social Drivers of Health   Tobacco Use: Medium Risk (11/30/2024)   Patient History    Smoking Tobacco Use: Former    Smokeless Tobacco Use: Never    Passive Exposure: Not on file  Financial Resource Strain: Low Risk (07/01/2022)   Overall Financial Resource Strain (CARDIA)    Difficulty of Paying Living Expenses: Not very hard  Food Insecurity: No Food Insecurity (12/21/2022)   Hunger Vital Sign  Worried About Programme Researcher, Broadcasting/film/video in the Last Year: Never true    Ran Out of Food in the Last Year: Never true  Transportation Needs: No Transportation Needs (12/21/2022)   PRAPARE - Administrator, Civil Service (Medical): No    Lack of Transportation (Non-Medical): No  Physical Activity: Sufficiently Active (07/01/2022)   Exercise Vital Sign    Days of Exercise per Week: 5 days    Minutes of Exercise per Session: 30 min  Stress: No Stress Concern Present (07/01/2022)   [employer] of Occupational Health - Occupational Stress Questionnaire    Feeling of Stress : Not at all  Social Connections: Moderately Isolated (07/01/2022)   Social Connection and Isolation Panel    Frequency of Communication with Friends and Family: More than three times a week    Frequency of Social Gatherings with Friends and Family: Three times a week    Attends Religious Services: 1 to 4 times per year    Active Member of Clubs or  Organizations: No    Attends Banker Meetings: Never    Marital Status: Never married  Depression (PHQ2-9): Low Risk (11/18/2023)   Depression (PHQ2-9)    PHQ-2 Score: 0  Alcohol Screen: Low Risk (07/01/2022)   Alcohol Screen    Last Alcohol Screening Score (AUDIT): 0  Housing: Low Risk (12/21/2022)   Housing    Last Housing Risk Score: 0  Utilities: Not At Risk (12/21/2022)   AHC Utilities    Threatened with loss of utilities: No  Health Literacy: Not on file    Family History  Problem Relation Age of Onset   Bipolar disorder Mother    Schizophrenia Mother    Diabetes Mother    Hypertension Mother    Kidney disease Mother    Stroke Father    Heart disease Maternal Grandmother    Heart disease Paternal Grandfather     Current Medications[1]  Review of Systems  Review of Systems  Constitutional: Negative for fever, chills, weight loss, malaise/fatigue and diaphoresis.  HENT: Negative for hearing loss, ear pain, nosebleeds, congestion, sore throat, neck pain, tinnitus and ear discharge.   Eyes: Negative for blurred vision, double vision, photophobia, pain, discharge and redness.  Respiratory: Negative for cough, hemoptysis, sputum production, shortness of breath, wheezing and stridor.   Cardiovascular: Negative for chest pain, palpitations, orthopnea, claudication, leg swelling and PND.  Gastrointestinal: negative for abdominal pain. Negative for heartburn, nausea, vomiting, diarrhea, constipation, blood in stool and melena.  Genitourinary: Negative for dysuria, urgency, frequency, hematuria and flank pain.  Musculoskeletal: Negative for myalgias, back pain, joint pain and falls.  Skin: Negative for itching and rash.  Neurological: Negative for dizziness, tingling, tremors, sensory change, speech change, focal weakness, seizures, loss of consciousness, weakness and headaches.  Endo/Heme/Allergies: Negative for environmental allergies and polydipsia. Does not  bruise/bleed easily.  Psychiatric/Behavioral: Negative for depression, suicidal ideas, hallucinations, memory loss and substance abuse. The patient is not nervous/anxious and does not have insomnia.        Objective:  Blood pressure 122/85, pulse 83, height 5' 2 (1.575 m), weight 223 lb (101.2 kg), last menstrual period 11/12/2024, not currently breastfeeding.   Physical Exam  Vitals reviewed. Constitutional: She is oriented to person, place, and time. She appears well-developed and well-nourished.  HENT:  Head: Normocephalic and atraumatic.        Right Ear: External ear normal.  Left Ear: External ear normal.  Nose: Nose normal.  Mouth/Throat: Oropharynx is  clear and moist.  Eyes: Conjunctivae and EOM are normal. Pupils are equal, round, and reactive to light. Right eye exhibits no discharge. Left eye exhibits no discharge. No scleral icterus.  Neck: Normal range of motion. Neck supple. No tracheal deviation present. No thyromegaly present.  Cardiovascular: Normal rate, regular rhythm, normal heart sounds and intact distal pulses.  Exam reveals no gallop and no friction rub.   No murmur heard. Respiratory: Effort normal and breath sounds normal. No respiratory distress. She has no wheezes. She has no rales. She exhibits no tenderness.  GI: Soft. Bowel sounds are normal. She exhibits no distension and no mass. There is no tenderness. There is no rebound and no guarding.  Genitourinary:  Breasts no masses skin changes or nipple changes bilaterally      Vulva is normal without lesions Vagina is pink moist without discharge Cervix normal in appearance and pap is done Uterus is normal size shape and contour Adnexa is negative with normal sized ovaries   Musculoskeletal: Normal range of motion. She exhibits no edema and no tenderness.  Neurological: She is alert and oriented to person, place, and time. She has normal reflexes. She displays normal reflexes. No cranial nerve deficit. She  exhibits normal muscle tone. Coordination normal.  Skin: Skin is warm and dry. No rash noted. No erythema. No pallor.  Psychiatric: She has a normal mood and affect. Her behavior is normal. Judgment and thought content normal.       Medications Ordered at today's visit: Meds ordered this encounter  Medications   silver  sulfADIAZINE  (SILVADENE ) 1 % cream    Sig: Apply to areas 2-3 times daily    Dispense:  50 g    Refill:  11    Other orders placed at today's visit: No orders of the defined types were placed in this encounter.    ASSESSMENT + PLAN:    ICD-10-CM   1. Well woman exam with routine gynecological exam  Z01.419     2. Encounter for gynecological examination with Papanicolaou smear of cervix  Z01.419 Cytology - PAP( [place])          Return in about 3 years (around 12/01/2027), or if symptoms worsen or fail to improve.     [1]  Current Outpatient Medications:    ALPRAZolam (XANAX) 0.25 MG tablet, Take 0.25 mg by mouth 2 (two) times daily as needed., Disp: , Rfl:    cyclobenzaprine  (FLEXERIL ) 10 MG tablet, Take 10 mg by mouth 3 (three) times daily as needed for muscle spasms., Disp: , Rfl:    gabapentin  (NEURONTIN ) 300 MG capsule, Take 300 mg by mouth 3 (three) times daily., Disp: , Rfl:    HYDROcodone -acetaminophen  (NORCO/VICODIN) 5-325 MG tablet, Take 1 tablet by mouth every 4 (four) hours., Disp: , Rfl:    Secukinumab (COSENTYX SENSOREADY PEN) 150 MG/ML SOAJ, , Disp: , Rfl:    sertraline (ZOLOFT) 50 MG tablet, Take by mouth., Disp: , Rfl:    silver  sulfADIAZINE  (SILVADENE ) 1 % cream, Apply to areas 2-3 times daily, Disp: 50 g, Rfl: 11   Vitamin D, Ergocalciferol, (DRISDOL) 1.25 MG (50000 UNIT) CAPS capsule, Take 50,000 Units by mouth once a week., Disp: , Rfl:    FEROSUL 325 (65 Fe) MG tablet, Take 325 mg by mouth daily. (Patient not taking: Reported on 11/30/2024), Disp: , Rfl:    FLUoxetine  (PROZAC ) 40 MG capsule, Take 1 capsule (40 mg total) by mouth daily.  (Patient not taking: Reported on 11/30/2024), Disp: 30  capsule, Rfl: 4   naloxone  (NARCAN ) nasal spray 4 mg/0.1 mL, Place 1 spray into the nose once. For opiate overdose. (Patient not taking: Reported on 11/30/2024), Disp: , Rfl:    WEGOVY 0.25 MG/0.5ML SOAJ, Inject 0.25 mg into the skin once a week. (Patient not taking: Reported on 11/30/2024), Disp: , Rfl:

## 2024-12-02 LAB — CYTOLOGY - PAP
Chlamydia: NEGATIVE
Comment: NEGATIVE
Comment: NEGATIVE
Comment: NORMAL
Diagnosis: NEGATIVE
High risk HPV: NEGATIVE
Neisseria Gonorrhea: NEGATIVE
